# Patient Record
Sex: Female | Born: 1935
Health system: Southern US, Community
[De-identification: ages and names within clinical notes are randomized; demographics above are authoritative.]

## PROBLEM LIST (undated history)

## (undated) DIAGNOSIS — E785 Hyperlipidemia, unspecified: Secondary | ICD-10-CM

## (undated) DIAGNOSIS — I10 Essential (primary) hypertension: Secondary | ICD-10-CM

## (undated) DIAGNOSIS — H409 Unspecified glaucoma: Secondary | ICD-10-CM

## (undated) DIAGNOSIS — M1990 Unspecified osteoarthritis, unspecified site: Secondary | ICD-10-CM

## (undated) DIAGNOSIS — E079 Disorder of thyroid, unspecified: Secondary | ICD-10-CM

## (undated) DIAGNOSIS — M810 Age-related osteoporosis without current pathological fracture: Secondary | ICD-10-CM

## (undated) HISTORY — DX: Essential (primary) hypertension: I10

## (undated) HISTORY — DX: Unspecified glaucoma: H40.9

## (undated) HISTORY — DX: Unspecified osteoarthritis, unspecified site: M19.90

## (undated) HISTORY — DX: Disorder of thyroid, unspecified: E07.9

## (undated) HISTORY — DX: Age-related osteoporosis without current pathological fracture: M81.0

## (undated) HISTORY — DX: Hyperlipidemia, unspecified: E78.5

---

## 1963-03-25 HISTORY — PX: ABDOMINAL HYSTERECTOMY: SHX81

## 1987-03-25 HISTORY — PX: CHOLECYSTECTOMY: SHX55

## 2009-02-13 LAB — HM COLONOSCOPY: HM COLON: NORMAL

## 2009-11-27 LAB — HM MAMMOGRAPHY: HM Mammogram: NORMAL

## 2011-04-02 DIAGNOSIS — I10 Essential (primary) hypertension: Secondary | ICD-10-CM | POA: Diagnosis not present

## 2011-04-02 DIAGNOSIS — Z23 Encounter for immunization: Secondary | ICD-10-CM | POA: Diagnosis not present

## 2011-04-28 DIAGNOSIS — H4011X0 Primary open-angle glaucoma, stage unspecified: Secondary | ICD-10-CM | POA: Diagnosis not present

## 2011-05-05 DIAGNOSIS — H2510 Age-related nuclear cataract, unspecified eye: Secondary | ICD-10-CM | POA: Diagnosis not present

## 2011-05-05 DIAGNOSIS — H4011X0 Primary open-angle glaucoma, stage unspecified: Secondary | ICD-10-CM | POA: Diagnosis not present

## 2011-05-05 DIAGNOSIS — H409 Unspecified glaucoma: Secondary | ICD-10-CM | POA: Diagnosis not present

## 2011-05-05 DIAGNOSIS — H524 Presbyopia: Secondary | ICD-10-CM | POA: Diagnosis not present

## 2011-07-29 DIAGNOSIS — I10 Essential (primary) hypertension: Secondary | ICD-10-CM | POA: Diagnosis not present

## 2011-09-01 DIAGNOSIS — H409 Unspecified glaucoma: Secondary | ICD-10-CM | POA: Diagnosis not present

## 2011-09-01 DIAGNOSIS — H4011X0 Primary open-angle glaucoma, stage unspecified: Secondary | ICD-10-CM | POA: Diagnosis not present

## 2011-10-30 DIAGNOSIS — I739 Peripheral vascular disease, unspecified: Secondary | ICD-10-CM | POA: Diagnosis not present

## 2011-10-30 DIAGNOSIS — I10 Essential (primary) hypertension: Secondary | ICD-10-CM | POA: Diagnosis not present

## 2011-10-30 DIAGNOSIS — R609 Edema, unspecified: Secondary | ICD-10-CM | POA: Diagnosis not present

## 2011-12-12 DIAGNOSIS — I70209 Unspecified atherosclerosis of native arteries of extremities, unspecified extremity: Secondary | ICD-10-CM | POA: Diagnosis not present

## 2011-12-12 DIAGNOSIS — I739 Peripheral vascular disease, unspecified: Secondary | ICD-10-CM | POA: Diagnosis not present

## 2011-12-29 DIAGNOSIS — H409 Unspecified glaucoma: Secondary | ICD-10-CM | POA: Diagnosis not present

## 2011-12-29 DIAGNOSIS — H4011X0 Primary open-angle glaucoma, stage unspecified: Secondary | ICD-10-CM | POA: Diagnosis not present

## 2012-02-11 DIAGNOSIS — M949 Disorder of cartilage, unspecified: Secondary | ICD-10-CM | POA: Diagnosis not present

## 2012-02-11 DIAGNOSIS — R51 Headache: Secondary | ICD-10-CM | POA: Diagnosis not present

## 2012-02-11 DIAGNOSIS — R413 Other amnesia: Secondary | ICD-10-CM | POA: Diagnosis not present

## 2012-02-11 DIAGNOSIS — I10 Essential (primary) hypertension: Secondary | ICD-10-CM | POA: Diagnosis not present

## 2012-02-11 DIAGNOSIS — E785 Hyperlipidemia, unspecified: Secondary | ICD-10-CM | POA: Diagnosis not present

## 2012-02-11 DIAGNOSIS — R42 Dizziness and giddiness: Secondary | ICD-10-CM | POA: Diagnosis not present

## 2012-03-03 DIAGNOSIS — R944 Abnormal results of kidney function studies: Secondary | ICD-10-CM | POA: Diagnosis not present

## 2012-05-24 DIAGNOSIS — H4011X0 Primary open-angle glaucoma, stage unspecified: Secondary | ICD-10-CM | POA: Diagnosis not present

## 2012-05-24 DIAGNOSIS — H2589 Other age-related cataract: Secondary | ICD-10-CM | POA: Diagnosis not present

## 2012-07-21 DIAGNOSIS — I10 Essential (primary) hypertension: Secondary | ICD-10-CM | POA: Diagnosis not present

## 2012-07-21 DIAGNOSIS — H6120 Impacted cerumen, unspecified ear: Secondary | ICD-10-CM | POA: Diagnosis not present

## 2012-07-21 DIAGNOSIS — E785 Hyperlipidemia, unspecified: Secondary | ICD-10-CM | POA: Diagnosis not present

## 2012-07-21 DIAGNOSIS — E782 Mixed hyperlipidemia: Secondary | ICD-10-CM | POA: Diagnosis not present

## 2012-07-30 DIAGNOSIS — R944 Abnormal results of kidney function studies: Secondary | ICD-10-CM | POA: Diagnosis not present

## 2012-09-23 DIAGNOSIS — H4010X0 Unspecified open-angle glaucoma, stage unspecified: Secondary | ICD-10-CM | POA: Diagnosis not present

## 2012-09-23 DIAGNOSIS — H2510 Age-related nuclear cataract, unspecified eye: Secondary | ICD-10-CM | POA: Diagnosis not present

## 2012-09-23 DIAGNOSIS — H04129 Dry eye syndrome of unspecified lacrimal gland: Secondary | ICD-10-CM | POA: Diagnosis not present

## 2012-10-04 DIAGNOSIS — H2589 Other age-related cataract: Secondary | ICD-10-CM | POA: Diagnosis not present

## 2012-11-03 DIAGNOSIS — IMO0002 Reserved for concepts with insufficient information to code with codable children: Secondary | ICD-10-CM | POA: Diagnosis not present

## 2012-11-03 DIAGNOSIS — H2589 Other age-related cataract: Secondary | ICD-10-CM | POA: Diagnosis not present

## 2012-11-03 DIAGNOSIS — H52209 Unspecified astigmatism, unspecified eye: Secondary | ICD-10-CM | POA: Diagnosis not present

## 2012-11-03 DIAGNOSIS — H2510 Age-related nuclear cataract, unspecified eye: Secondary | ICD-10-CM | POA: Diagnosis not present

## 2012-11-03 DIAGNOSIS — H52229 Regular astigmatism, unspecified eye: Secondary | ICD-10-CM | POA: Diagnosis not present

## 2012-12-15 DIAGNOSIS — I10 Essential (primary) hypertension: Secondary | ICD-10-CM | POA: Diagnosis not present

## 2013-02-09 DIAGNOSIS — IMO0002 Reserved for concepts with insufficient information to code with codable children: Secondary | ICD-10-CM | POA: Diagnosis not present

## 2013-02-09 DIAGNOSIS — H2510 Age-related nuclear cataract, unspecified eye: Secondary | ICD-10-CM | POA: Diagnosis not present

## 2013-02-09 DIAGNOSIS — H52209 Unspecified astigmatism, unspecified eye: Secondary | ICD-10-CM | POA: Diagnosis not present

## 2013-02-09 DIAGNOSIS — H52229 Regular astigmatism, unspecified eye: Secondary | ICD-10-CM | POA: Diagnosis not present

## 2013-03-15 DIAGNOSIS — E782 Mixed hyperlipidemia: Secondary | ICD-10-CM | POA: Diagnosis not present

## 2013-03-15 DIAGNOSIS — I10 Essential (primary) hypertension: Secondary | ICD-10-CM | POA: Diagnosis not present

## 2013-03-15 DIAGNOSIS — I739 Peripheral vascular disease, unspecified: Secondary | ICD-10-CM | POA: Diagnosis not present

## 2013-03-15 DIAGNOSIS — Z13820 Encounter for screening for osteoporosis: Secondary | ICD-10-CM | POA: Diagnosis not present

## 2013-03-15 DIAGNOSIS — I6529 Occlusion and stenosis of unspecified carotid artery: Secondary | ICD-10-CM | POA: Diagnosis not present

## 2013-03-15 DIAGNOSIS — I70219 Atherosclerosis of native arteries of extremities with intermittent claudication, unspecified extremity: Secondary | ICD-10-CM | POA: Diagnosis not present

## 2013-03-15 DIAGNOSIS — M899 Disorder of bone, unspecified: Secondary | ICD-10-CM | POA: Diagnosis not present

## 2013-03-15 DIAGNOSIS — E785 Hyperlipidemia, unspecified: Secondary | ICD-10-CM | POA: Diagnosis not present

## 2013-03-15 LAB — HM DEXA SCAN

## 2013-03-21 DIAGNOSIS — D72829 Elevated white blood cell count, unspecified: Secondary | ICD-10-CM | POA: Diagnosis not present

## 2013-03-21 DIAGNOSIS — D72828 Other elevated white blood cell count: Secondary | ICD-10-CM | POA: Diagnosis not present

## 2013-04-20 DIAGNOSIS — D72829 Elevated white blood cell count, unspecified: Secondary | ICD-10-CM | POA: Diagnosis not present

## 2013-04-20 DIAGNOSIS — I10 Essential (primary) hypertension: Secondary | ICD-10-CM | POA: Diagnosis not present

## 2013-04-20 DIAGNOSIS — M2550 Pain in unspecified joint: Secondary | ICD-10-CM | POA: Diagnosis not present

## 2013-04-20 DIAGNOSIS — Z0189 Encounter for other specified special examinations: Secondary | ICD-10-CM | POA: Diagnosis not present

## 2013-05-18 DIAGNOSIS — I10 Essential (primary) hypertension: Secondary | ICD-10-CM | POA: Diagnosis not present

## 2013-05-18 DIAGNOSIS — D72829 Elevated white blood cell count, unspecified: Secondary | ICD-10-CM | POA: Diagnosis not present

## 2013-05-18 DIAGNOSIS — C9110 Chronic lymphocytic leukemia of B-cell type not having achieved remission: Secondary | ICD-10-CM | POA: Diagnosis not present

## 2013-06-01 DIAGNOSIS — H4011X0 Primary open-angle glaucoma, stage unspecified: Secondary | ICD-10-CM | POA: Diagnosis not present

## 2013-08-04 DIAGNOSIS — C9110 Chronic lymphocytic leukemia of B-cell type not having achieved remission: Secondary | ICD-10-CM | POA: Diagnosis not present

## 2013-08-04 DIAGNOSIS — E039 Hypothyroidism, unspecified: Secondary | ICD-10-CM | POA: Diagnosis not present

## 2013-08-04 DIAGNOSIS — D72829 Elevated white blood cell count, unspecified: Secondary | ICD-10-CM | POA: Diagnosis not present

## 2013-08-04 DIAGNOSIS — E782 Mixed hyperlipidemia: Secondary | ICD-10-CM | POA: Diagnosis not present

## 2013-08-04 DIAGNOSIS — I70219 Atherosclerosis of native arteries of extremities with intermittent claudication, unspecified extremity: Secondary | ICD-10-CM | POA: Diagnosis not present

## 2013-11-16 ENCOUNTER — Encounter: Payer: Self-pay | Admitting: Hematology & Oncology

## 2013-11-23 ENCOUNTER — Telehealth: Payer: Self-pay | Admitting: Hematology & Oncology

## 2013-11-23 ENCOUNTER — Ambulatory Visit (HOSPITAL_BASED_OUTPATIENT_CLINIC_OR_DEPARTMENT_OTHER): Payer: Medicare Other | Admitting: Hematology & Oncology

## 2013-11-23 ENCOUNTER — Encounter: Payer: Self-pay | Admitting: Hematology & Oncology

## 2013-11-23 ENCOUNTER — Other Ambulatory Visit (HOSPITAL_BASED_OUTPATIENT_CLINIC_OR_DEPARTMENT_OTHER): Payer: Medicare Other | Admitting: Lab

## 2013-11-23 ENCOUNTER — Ambulatory Visit: Payer: Medicare Other

## 2013-11-23 VITALS — BP 155/77 | HR 70 | Temp 98.2°F | Resp 14 | Ht 60.0 in | Wt 162.0 lb

## 2013-11-23 DIAGNOSIS — C9110 Chronic lymphocytic leukemia of B-cell type not having achieved remission: Secondary | ICD-10-CM

## 2013-11-23 LAB — CBC WITH DIFFERENTIAL (CANCER CENTER ONLY)
BASO#: 0 10*3/uL (ref 0.0–0.2)
BASO%: 0.2 % (ref 0.0–2.0)
EOS%: 2.2 % (ref 0.0–7.0)
Eosinophils Absolute: 0.2 10*3/uL (ref 0.0–0.5)
HEMATOCRIT: 41.4 % (ref 34.8–46.6)
HGB: 14 g/dL (ref 11.6–15.9)
LYMPH#: 6.7 10*3/uL — ABNORMAL HIGH (ref 0.9–3.3)
LYMPH%: 60.9 % — ABNORMAL HIGH (ref 14.0–48.0)
MCH: 32 pg (ref 26.0–34.0)
MCHC: 33.8 g/dL (ref 32.0–36.0)
MCV: 95 fL (ref 81–101)
MONO#: 0.9 10*3/uL (ref 0.1–0.9)
MONO%: 8.1 % (ref 0.0–13.0)
NEUT%: 28.6 % — AB (ref 39.6–80.0)
NEUTROS ABS: 3.1 10*3/uL (ref 1.5–6.5)
Platelets: 193 10*3/uL (ref 145–400)
RBC: 4.38 10*6/uL (ref 3.70–5.32)
RDW: 13.4 % (ref 11.1–15.7)
WBC: 10.9 10*3/uL — ABNORMAL HIGH (ref 3.9–10.0)

## 2013-11-23 LAB — CHCC SATELLITE - SMEAR

## 2013-11-23 NOTE — Telephone Encounter (Signed)
I spoke w NEW PATIENT today to remind them of their appointment with Dr. Ennever. Also, advised them to bring all medication bottles and insurance card information. ° °

## 2013-11-23 NOTE — Progress Notes (Signed)
Referral MD  Reason for Referral: Stage A chronic lymphocytic leukemia   Chief Complaint  Patient presents with  . NEW PATIENT  : I just moved here from [place].  HPI: Ms.Cogdell is a very charming 80 year old white female. She certainly looks younger. She actually is the grandmother of our pharmacist, been, downstairs.  She's been in [place] for 62 years. Her husband passed away. She's been by herself. Her daughter lives in [place]. As such, she now has moved back to the [place] to be with her family.  She was diagnosed with CLL. She was found to have an elevated white cell count over the past few years. She ultimately underwent flow cytometry which showed that she had CLL. She had this done back in February of this year. Parishes been totally asymptomatic. As such, it was felt that she could just be followed. It was not felt that she needed any type of intervention.  She's had a problem with infections. She's had a problem with rashes. There's been no swollen glands. She's had no abdominal pain. She's had no change in bowel or bladder habits. She has had no weight loss or weight gain.  Overall, her performance status is ECOG 1  She is currently referred to the [facility] so that we could evaluate her for the CLL.   No past medical history on file.:  No past surgical history on file.:  Current outpatient prescriptions:amLODipine (NORVASC) 5 MG tablet, Take 5 mg by mouth daily., Disp: , Rfl: ;  latanoprost (XALATAN) 0.005 % ophthalmic solution, Place 1 drop into both eyes at bedtime., Disp: , Rfl: ;  Levothyroxine Sodium 25 MCG CAPS, Take by mouth daily before breakfast., Disp: , Rfl: ;  Multiple Vitamins-Minerals (SENIOR MULTIVITAMIN PLUS PO), Take by mouth every morning., Disp: , Rfl:  Naproxen Sodium (ALEVE PO), Take by mouth as needed., Disp: , Rfl: ;  olmesartan (BENICAR) 20 MG tablet, Take 20 mg by mouth daily., Disp: , Rfl: ;  simvastatin (ZOCOR) 20 MG tablet, Take 20 mg  by mouth daily., Disp: , Rfl: :  :  No Known Allergies:  No family history on file.:  History   Social History  . Marital Status: Widowed    Spouse Name: N/A    Number of Children: N/A  . Years of Education: N/A   Occupational History  . Not on file.   Social History Main Topics  . Smoking status: Never Smoker   . Smokeless tobacco: Never Used     Comment: never used tobacco  . Alcohol Use: Not on file  . Drug Use: Not on file  . Sexual Activity: Not on file   Other Topics Concern  . Not on file   Social History Narrative  . No narrative on file  :  Pertinent items are noted in HPI.  Exam: @IPVITALS @  well-developed and well-nourished elderly female in no obvious distress. Her vital signs show temperature of 98.2. Pulse 70. Blood pressure 155/77. Weight is 162 pounds. Head and neck exam shows no ocular or oral lesions. There are no palpable cervical or supraclavicular lymph nodes. Lungs are clear bilaterally. Cardiac exam regular in rhythm with no murmurs rubs or bruits. Abdomen is soft. She has good bowel sounds. There is no fluid wave. There is no palpable liver or spleen tip. Back exam shows some slight kyphosis. No tenderness is noted over the spine ribs or hips. Extremities shows no clubbing, cyanosis or edema. Neurological exam shows no focal neurological deficits.  Recent Labs  11/23/13 1312  WBC 10.9*  HGB 14.0  HCT 41.4  PLT 193   No results found for this basename: NA, K, CL, CO2, GLUCOSE, BUN, CREATININE, CALCIUM,  in the last 72 hours  Blood smear review: Normochromic and normocytic probably separate blood cells. She has no rouleau formation. There is no schistocytes or spherocytes. She has no nucleated red blood cells. White cells are minimally increased in number. She is majority of a mature lymphocytes. There is no immature myeloid cells. I see no atypical lymphocytes. Platelet are adequate in number and size.  Pathology: Flow cytometry done back  in January in [place] showed her white blood cells to be CD5 positive.     Assessment and Plan:   Ms. Jarquin is a very charming 80 year old white female with CLL. She has a stage A disease.  Is absolutely no indication that we have to treat her. I suspect that she never will have to be treated given her age. I would think that she will likely succumb to some other disease long before the CLL becomes a problem.  She and her daughter certainly knew what was going on. They had been well read up on CLL.  I that we can probably get her back to see [place] in about 6 months. I don't see that we have to do any CT scans. I do not see a need for a bone marrow test. I think we just leave her alone and let her enjoy her time on the [facility].  I spent about 45 minutes with she and her daughter. I answered their questions.

## 2013-11-24 DIAGNOSIS — H40019 Open angle with borderline findings, low risk, unspecified eye: Secondary | ICD-10-CM | POA: Diagnosis not present

## 2013-11-24 DIAGNOSIS — H2640 Unspecified secondary cataract: Secondary | ICD-10-CM | POA: Diagnosis not present

## 2013-11-24 DIAGNOSIS — H04129 Dry eye syndrome of unspecified lacrimal gland: Secondary | ICD-10-CM | POA: Diagnosis not present

## 2013-11-24 DIAGNOSIS — H43819 Vitreous degeneration, unspecified eye: Secondary | ICD-10-CM | POA: Diagnosis not present

## 2014-01-02 DIAGNOSIS — Z23 Encounter for immunization: Secondary | ICD-10-CM | POA: Diagnosis not present

## 2014-01-04 DIAGNOSIS — C44310 Basal cell carcinoma of skin of unspecified parts of face: Secondary | ICD-10-CM | POA: Diagnosis not present

## 2014-01-30 ENCOUNTER — Telehealth: Payer: Self-pay

## 2014-01-30 DIAGNOSIS — C9110 Chronic lymphocytic leukemia of B-cell type not having achieved remission: Secondary | ICD-10-CM

## 2014-01-30 MED ORDER — SIMVASTATIN 20 MG PO TABS: 20.0000 mg | ORAL_TABLET | Freq: Every day | ORAL | Status: DC

## 2014-01-30 NOTE — Telephone Encounter (Signed)
Patient was scheduled to establish care on Monday 02/06/14, appointment was canceled per provider and rescheduled to 04/24/14. Patient will run out of medication (Simvastatin) prior to February appointment. The patient's daughter "[name]" would like to know if Dr.Reed will be willing to refill medication although she has never seen the patient.  [place] @ [facility]  If not, please advise on what patient should do, previous doctor is in [place].

## 2014-01-30 NOTE — Telephone Encounter (Signed)
Ok to fill simvastatin and any other non-controlled meds they need until she is seen.

## 2014-01-30 NOTE — Telephone Encounter (Signed)
Rx sent electronically, patients daughter informed.

## 2014-02-06 ENCOUNTER — Ambulatory Visit: Payer: Self-pay | Admitting: Internal Medicine

## 2014-02-07 ENCOUNTER — Encounter: Payer: Self-pay | Admitting: *Deleted

## 2014-02-21 HISTORY — PX: SKIN SURGERY: SHX2413

## 2014-03-07 DIAGNOSIS — C44112 Basal cell carcinoma of skin of right eyelid, including canthus: Secondary | ICD-10-CM | POA: Diagnosis not present

## 2014-03-28 DIAGNOSIS — H4011X3 Primary open-angle glaucoma, severe stage: Secondary | ICD-10-CM | POA: Diagnosis not present

## 2014-04-24 ENCOUNTER — Encounter: Payer: Self-pay | Admitting: Internal Medicine

## 2014-04-24 ENCOUNTER — Ambulatory Visit (INDEPENDENT_AMBULATORY_CARE_PROVIDER_SITE_OTHER): Payer: Medicare Other | Admitting: Internal Medicine

## 2014-04-24 VITALS — BP 126/78 | HR 74 | Temp 97.9°F | Ht 60.0 in | Wt 164.0 lb

## 2014-04-24 DIAGNOSIS — Z23 Encounter for immunization: Secondary | ICD-10-CM | POA: Diagnosis not present

## 2014-04-24 DIAGNOSIS — H409 Unspecified glaucoma: Secondary | ICD-10-CM

## 2014-04-24 DIAGNOSIS — M79609 Pain in unspecified limb: Secondary | ICD-10-CM | POA: Diagnosis not present

## 2014-04-24 DIAGNOSIS — M79651 Pain in right thigh: Secondary | ICD-10-CM

## 2014-04-24 DIAGNOSIS — E039 Hypothyroidism, unspecified: Secondary | ICD-10-CM

## 2014-04-24 DIAGNOSIS — G5690 Unspecified mononeuropathy of unspecified upper limb: Secondary | ICD-10-CM | POA: Diagnosis not present

## 2014-04-24 DIAGNOSIS — E785 Hyperlipidemia, unspecified: Secondary | ICD-10-CM | POA: Diagnosis not present

## 2014-04-24 DIAGNOSIS — C9110 Chronic lymphocytic leukemia of B-cell type not having achieved remission: Secondary | ICD-10-CM | POA: Insufficient documentation

## 2014-04-24 DIAGNOSIS — M810 Age-related osteoporosis without current pathological fracture: Secondary | ICD-10-CM

## 2014-04-24 DIAGNOSIS — I739 Peripheral vascular disease, unspecified: Secondary | ICD-10-CM

## 2014-04-24 DIAGNOSIS — I10 Essential (primary) hypertension: Secondary | ICD-10-CM | POA: Diagnosis not present

## 2014-04-24 DIAGNOSIS — R739 Hyperglycemia, unspecified: Secondary | ICD-10-CM | POA: Diagnosis not present

## 2014-04-24 DIAGNOSIS — M159 Polyosteoarthritis, unspecified: Secondary | ICD-10-CM

## 2014-04-24 MED ORDER — LEVOTHYROXINE SODIUM 25 MCG PO CAPS: 1.0000 | ORAL_CAPSULE | Freq: Every day | ORAL | Status: DC

## 2014-04-24 MED ORDER — SIMVASTATIN 20 MG PO TABS: 20.0000 mg | ORAL_TABLET | Freq: Every day | ORAL | Status: DC

## 2014-04-24 MED ORDER — ASPIRIN EC 81 MG PO TBEC: 81.0000 mg | DELAYED_RELEASE_TABLET | Freq: Every day | ORAL | Status: DC

## 2014-04-24 MED ORDER — VITAMIN D 50 MCG (2000 UT) PO CAPS: 2000.0000 [IU] | ORAL_CAPSULE | Freq: Every day | ORAL | Status: AC

## 2014-04-24 MED ORDER — AMLODIPINE BESYLATE 5 MG PO TABS: 5.0000 mg | ORAL_TABLET | Freq: Every day | ORAL | Status: DC

## 2014-04-24 MED ORDER — OLMESARTAN MEDOXOMIL 20 MG PO TABS: 20.0000 mg | ORAL_TABLET | Freq: Every day | ORAL | Status: DC

## 2014-04-24 NOTE — Progress Notes (Signed)
Patient ID: Krystal [name], female   DOB: December 03, 1925, 81 y.o.   MRN: [id number]   Location:  [facility]  Code Status: full code, does have living will  No Known Allergies                                                                                                                             Chief Complaint  Patient presents with  . Establish Care    New patient establish care (long term swelling of feet- never taken fluid pill )    HPI: Patient is a 81 y.o. female seen in the office today to establish with the practice.  Her daughter, Krystal [name], goes to [facility] next door.  She has had long term edema of her feet, but has never taken a diuretic.  Thinks it's been going on about a year.  They will go down overnight and come back up when she walks and is up and about.  No DVTs in the past.  No chest pains or shortness of breath.  Does have some arthritis in her back and bilateral hips.  If she knows she'll be very active walking, she takes the ibuprofen 2 pills.  Does not take daily.  Has a pain in her right upper leg that she thinks is a muscle that comes and goes.  Happens if gets up from being down on knees and gets back up.  It's inconsistent.  Relieved when gets up and walks.  Pain is sharp.  Goes away in 5-10 mins.  No numbness or tingling in her feet or legs.    Does sometimes have tingling in her hands at night.    BP stable with benicar and norvasc.  Has high cholesterol which has been controlled with simvastatin therapy.  Also takes fish oil.    Does not exercise.  Has never been one to do that even when young.    Has senile osteoporosis.  Takes the mvi, but no vitamin D or calcium supplement.  Her daughter thinks she did take some fosamax or boniva in the past, but pt does not recall this.    She has stage A CLL but does not warrant treatment.  She follows up with Dr. Marin Olp in 6 mos.  He does not feel she needs to have a  bone marrow biopsy.    Had been having pain through her lower abdomen.  [place] was revealing that she might have a vein blockage.  Specialist said that is was slight and she should f/u on it.  She has not followed up on this.    Synthroid is less than a year old.  Is taking it properly.  Glaucoma:  Says eyes are doing ok.  She's had cataract surgery.  Uses her drops before bed.  Cscope in 2010 was benign and does not need anymore. Had partial hysterectomy.   Bone density 2010 and 2014.  Has osteoporosis and her daughter thinks she did take a bisphosphonate in the past. Mammogram 2010.  All have been normal.   Had pneumovax in 2014 Had flu shot in October 2015. Had tdap 2014 Has living will but did not bring copy.  Last visit with her PCP in [place] was 5/15.    Review of Systems:  Review of Systems  Constitutional: Negative for weight loss and malaise/fatigue.  HENT: Positive for tinnitus. Negative for hearing loss.   Eyes:       Dry eyes, glaucoma, wears glasses  Respiratory: Negative for cough and shortness of breath.   Cardiovascular: Positive for leg swelling. Negative for chest pain, palpitations, orthopnea, claudication and PND.       High blood pressure  Gastrointestinal: Positive for heartburn and abdominal pain.       Hiatal hernia  Genitourinary: Negative for dysuria.  Musculoskeletal: Positive for myalgias, back pain and joint pain.       And swelling/stiffness; right thigh pain; osteoporosis  Skin:       Dry skin  Neurological: Negative for dizziness and weakness.  Psychiatric/Behavioral: Negative for depression and memory loss.       Says she has some increased difficulty with her memory (I was concerned as she tried to provide history today), confusion     Past Medical History  Diagnosis Date  . Hypertension   . Hyperlipidemia   . Thyroid disease   . Arthritis   . Osteoporosis   . Glaucoma     Past Surgical History  Procedure Laterality Date  . Abdominal  hysterectomy  1965    partial  . Cholecystectomy  1989    Social History:   reports that she has never smoked. She has never used smokeless tobacco. She reports that she does not drink alcohol or use illicit drugs.  Family History  Problem Relation Age of Onset  . Cancer Father     lung  . Heart disease Father   . Cancer Mother     Medications: Patient's Medications  New Prescriptions   No medications on file  Previous Medications   AMLODIPINE (NORVASC) 5 MG TABLET    Take 5 mg by mouth daily.   IBUPROFEN (ADVIL,MOTRIN) 200 MG TABLET    Take 200 mg by mouth as needed.   LATANOPROST (XALATAN) 0.005 % OPHTHALMIC SOLUTION    Place 1 drop into both eyes at bedtime.   LEVOTHYROXINE SODIUM 25 MCG CAPS    Take by mouth daily before breakfast.   MULTIPLE VITAMINS-MINERALS (SENIOR MULTIVITAMIN PLUS PO)    Take by mouth every morning.   OLMESARTAN (BENICAR) 20 MG TABLET    Take 20 mg by mouth daily.   OMEGA-3 FATTY ACIDS (FISH OIL) 1000 MG CAPS    Take 1,000 capsules by mouth 2 (two) times daily.   POLYETHYL GLYCOL-PROPYL GLYCOL (SYSTANE OP)    Apply to eye.   SIMVASTATIN (ZOCOR) 20 MG TABLET    Take 1 tablet (20 mg total) by mouth daily.  Modified Medications   No medications on file  Discontinued Medications   No medications on file     Physical Exam: Filed Vitals:   04/24/14 0936  BP: 126/78  Pulse: 74  Temp: 97.9 F (36.6 C)  TempSrc: Oral  Height: 5' (1.524 m)  Weight: 164 lb (74.39 kg)  SpO2: 95%  Physical Exam  Constitutional: She is oriented to person, place, and time. She appears well-developed and well-nourished. No distress.  Cardiovascular: Normal rate, regular  rhythm, normal heart sounds and intact distal pulses.   Pulmonary/Chest: Effort normal and breath sounds normal. No respiratory distress.  Abdominal: Soft. Bowel sounds are normal. She exhibits no distension and no mass. There is no tenderness.  Musculoskeletal: Normal range of motion. She exhibits no  tenderness.  Neurological: She is alert and oriented to person, place, and time. No cranial nerve deficit.  Confused some when providing history about her conditions and had timeframes incorrect  Skin: Skin is warm and dry.  Varicose veins in bilateral ankles and feet, DPs and PT pulses intact  Psychiatric: She has a normal mood and affect.    Labs reviewed: CBC:  Recent Labs  11/23/13 1312  WBC 10.9*  NEUTROABS 3.1  HGB 14.0  HCT 41.4  MCV 95  PLT 193   Assessment/Plan 1. CLL (chronic lymphocytic leukemia) -stable, follows with Dr. Venetia Constable a candidate for treatment or bone marrow biopsy, cont to monitor - CBC with Differential/Platelet  2. Essential hypertension, benign -bp is at goal today -did discuss that her norvasc can contribute to the edema in her ankles as well as her varicosities/chronic venous insuffiency - amLODipine (NORVASC) 5 MG tablet; Take 1 tablet (5 mg total) by mouth daily.  Dispense: 90 tablet; Refill: 1 - olmesartan (BENICAR) 20 MG tablet; Take 1 tablet (20 mg total) by mouth daily.  Dispense: 90 tablet; Refill: 1 - Comprehensive metabolic panel  3. Hyperlipidemia -cont zocor, discussed proper diet and encouraged more walking for exercise, f/u labs  - simvastatin (ZOCOR) 20 MG tablet; Take 1 tablet (20 mg total) by mouth daily.  Dispense: 90 tablet; Refill: 1 - Lipid panel - Hemoglobin A1c  4. Glaucoma -cont xalatan and f/u with Dr. Satira Sark as planned  5. Hypothyroidism, unspecified hypothyroidism type -is clinically euthyroid, cont current dose and check tsh - Levothyroxine Sodium 25 MCG CAPS; Take 1 capsule (25 mcg total) by mouth daily before breakfast.  Dispense: 90 capsule; Refill: 1 - TSH  6. Senile osteoporosis - last treatment was with Florestine Avers it appears in 2011 based on her records from her prior PCP, but she is not on anything at this time--advised to begin otc vitamin D 2000 units weekly and encouraged weightbearing exercise--will  discuss prolia or zoledronic acid at next visit - Cholecalciferol (VITAMIN D) 2000 UNITS CAPS; Take 1 capsule (2,000 Units total) by mouth daily.  Dispense: 30 capsule; Refill: 3  7. Generalized osteoarthritis of multiple sites -has been using prn ibuprofen for pain--does not use that much, but need to counsel next visit about tylenol instead to avoid renal and GI damage  8. Right thigh pain - sounds in some ways like claudication, others like a simple musculoskeletal pain;  Had prior abnormality on arterial doppler in [place] several years ago and copy was [facility]ed [facility]--she had not followed up on this based on the records and now has this pain and the lower abdominal pain (sounds like there had been a blockage in her left rather than right iliac) - B12 and Folate Panel - [place] ABI W/WO TBI; Future  9. PAD (peripheral artery disease) -see #8 - aspirin EC 81 MG tablet; Take 1 tablet (81 mg total) by mouth daily.  Dispense: 30 tablet; Refill: 3 - Comprehensive metabolic panel - Hemoglobin A1c  10. Need for vaccination with 13-polyvalent pneumococcal conjugate vaccine - Pneumococcal conjugate vaccine 13-valent (prevnar) given  Labs/tests ordered: Orders Placed This Encounter  Procedures  . [place] ABI W/WO TBI    Standing Status:  Future     Number of Occurrences:      Standing Expiration Date: 06/26/2015    Order Specific Question:  Reason for Exam (SYMPTOM  OR DIAGNOSIS REQUIRED)    Answer:  pvd    Order Specific Question:  Preferred imaging location?    Answer:  Internal  . Pneumococcal conjugate vaccine 13-valent  . Comprehensive metabolic panel    Order Specific Question:  Has the patient fasted?    Answer:  Yes  . Lipid panel    Order Specific Question:  Has the patient fasted?    Answer:  Yes  . Hemoglobin A1c  . TSH  . B12 and Folate Panel  . CBC with Differential/Platelet  . HM COLONOSCOPY    This external order was created through the Results Console.    Next  appt:  3 mos for annual exam with mmse due to confusion noted (her daughter had to fill in a lot of info)  Henley Blyth L. Mcclellan Demarais, D.O. [facility] [address], [place] [address] Cell Phone (Mon-Fri 8am-5pm):  [phone] On Call:  [phone] & follow prompts after 5pm & weekends Office Phone:  [phone] Office Fax:  [phone]

## 2014-04-25 ENCOUNTER — Other Ambulatory Visit: Payer: Self-pay | Admitting: Internal Medicine

## 2014-04-25 DIAGNOSIS — M79651 Pain in right thigh: Secondary | ICD-10-CM

## 2014-04-25 LAB — CBC WITH DIFFERENTIAL/PLATELET
Basophils Absolute: 0 10*3/uL (ref 0.0–0.2)
Basos: 0 %
Eos: 2 %
Eosinophils Absolute: 0.2 10*3/uL (ref 0.0–0.4)
HCT: 39.2 % (ref 34.0–46.6)
Hemoglobin: 13.3 g/dL (ref 11.1–15.9)
Immature Grans (Abs): 0 10*3/uL (ref 0.0–0.1)
Immature Granulocytes: 0 %
Lymphocytes Absolute: 6 10*3/uL — ABNORMAL HIGH (ref 0.7–3.1)
Lymphs: 61 %
MCH: 30.9 pg (ref 26.6–33.0)
MCHC: 33.9 g/dL (ref 31.5–35.7)
MCV: 91 fL (ref 79–97)
Monocytes Absolute: 0.7 10*3/uL (ref 0.1–0.9)
Monocytes: 7 %
Neutrophils Absolute: 2.9 10*3/uL (ref 1.4–7.0)
Neutrophils Relative %: 30 %
Platelets: 208 10*3/uL (ref 150–379)
RBC: 4.31 x10E6/uL (ref 3.77–5.28)
RDW: 13.2 % (ref 12.3–15.4)
WBC: 9.8 10*3/uL (ref 3.4–10.8)

## 2014-04-25 LAB — TSH: TSH: 3.36 u[IU]/mL (ref 0.450–4.500)

## 2014-04-25 LAB — LIPID PANEL
Chol/HDL Ratio: 2 ratio units (ref 0.0–4.4)
Cholesterol, Total: 141 mg/dL (ref 100–199)
HDL: 69 mg/dL (ref >39)
LDL Calculated: 62 mg/dL (ref 0–99)
Triglycerides: 51 mg/dL (ref 0–149)
VLDL Cholesterol Cal: 10 mg/dL (ref 5–40)

## 2014-04-25 LAB — HEMOGLOBIN A1C
Est. average glucose Bld gHb Est-mCnc: 123 mg/dL
Hgb A1c MFr Bld: 5.9 % — ABNORMAL HIGH (ref 4.8–5.6)

## 2014-04-25 LAB — COMPREHENSIVE METABOLIC PANEL
ALT: 10 IU/L (ref 0–32)
AST: 23 IU/L (ref 0–40)
Albumin/Globulin Ratio: 2.2 (ref 1.1–2.5)
Albumin: 4.1 g/dL (ref 3.5–4.7)
Alkaline Phosphatase: 77 IU/L (ref 39–117)
BUN/Creatinine Ratio: 19 (ref 11–26)
BUN: 17 mg/dL (ref 8–27)
CO2: 21 mmol/L (ref 18–29)
Calcium: 9.3 mg/dL (ref 8.7–10.3)
Chloride: 102 mmol/L (ref 97–108)
Creatinine, Ser: 0.91 mg/dL (ref 0.57–1.00)
GFR calc Af Amer: 66 mL/min/{1.73_m2} (ref >59)
GFR calc non Af Amer: 57 mL/min/{1.73_m2} — ABNORMAL LOW (ref >59)
Globulin, Total: 1.9 g/dL (ref 1.5–4.5)
Glucose: 92 mg/dL (ref 65–99)
Potassium: 4.4 mmol/L (ref 3.5–5.2)
Sodium: 140 mmol/L (ref 134–144)
Total Bilirubin: 0.8 mg/dL (ref 0.0–1.2)
Total Protein: 6 g/dL (ref 6.0–8.5)

## 2014-04-25 LAB — B12 AND FOLATE PANEL
Folate: >20 ng/mL (ref >3.0)
Vitamin B-12: 656 pg/mL (ref 211–946)

## 2014-04-27 ENCOUNTER — Other Ambulatory Visit: Payer: Self-pay | Admitting: Internal Medicine

## 2014-04-27 DIAGNOSIS — M79651 Pain in right thigh: Secondary | ICD-10-CM

## 2014-04-27 DIAGNOSIS — I739 Peripheral vascular disease, unspecified: Secondary | ICD-10-CM

## 2014-05-01 ENCOUNTER — Ambulatory Visit (HOSPITAL_COMMUNITY)
Admission: RE | Admit: 2014-05-01 | Discharge: 2014-05-01 | Disposition: A | Discharge status: Home / Self Care | Payer: Medicare Other | Source: Ambulatory Visit | Attending: Internal Medicine | Admitting: Internal Medicine

## 2014-05-01 ENCOUNTER — Ambulatory Visit (HOSPITAL_COMMUNITY)
Admission: RE | Admit: 2014-05-01 | Discharge: 2014-05-01 | Disposition: A | Discharge status: Home / Self Care | Payer: Medicare Other | Source: Ambulatory Visit | Attending: Surgery | Admitting: Surgery

## 2014-05-01 DIAGNOSIS — E785 Hyperlipidemia, unspecified: Secondary | ICD-10-CM | POA: Insufficient documentation

## 2014-05-01 DIAGNOSIS — I739 Peripheral vascular disease, unspecified: Secondary | ICD-10-CM

## 2014-05-01 DIAGNOSIS — M79651 Pain in right thigh: Secondary | ICD-10-CM

## 2014-05-01 DIAGNOSIS — I10 Essential (primary) hypertension: Secondary | ICD-10-CM | POA: Diagnosis not present

## 2014-05-09 ENCOUNTER — Telehealth: Payer: Self-pay | Admitting: *Deleted

## 2014-05-09 NOTE — Telephone Encounter (Signed)
Patient was called and informed of her results from her arterial study. She understood that the study came back normal, and was happy with the results. Her daughter was also called with the report as well.

## 2014-05-24 ENCOUNTER — Other Ambulatory Visit (HOSPITAL_BASED_OUTPATIENT_CLINIC_OR_DEPARTMENT_OTHER): Payer: Medicare Other | Admitting: Lab

## 2014-05-24 ENCOUNTER — Ambulatory Visit (HOSPITAL_BASED_OUTPATIENT_CLINIC_OR_DEPARTMENT_OTHER): Payer: Medicare Other | Admitting: Hematology & Oncology

## 2014-05-24 ENCOUNTER — Encounter: Payer: Self-pay | Admitting: Hematology & Oncology

## 2014-05-24 VITALS — BP 149/62 | HR 75 | Temp 97.9°F | Resp 14 | Ht 60.0 in | Wt 165.0 lb

## 2014-05-24 DIAGNOSIS — C9110 Chronic lymphocytic leukemia of B-cell type not having achieved remission: Secondary | ICD-10-CM | POA: Diagnosis not present

## 2014-05-24 LAB — CMP (CANCER CENTER ONLY)
ALBUMIN: 3.9 g/dL (ref 3.3–5.5)
ALT: 9 U/L — AB (ref 10–47)
AST: 19 U/L (ref 11–38)
Alkaline Phosphatase: 84 U/L (ref 26–84)
BUN, Bld: 19 mg/dL (ref 7–22)
CALCIUM: 9.4 mg/dL (ref 8.0–10.3)
CO2: 26 meq/L (ref 18–33)
Chloride: 102 mEq/L (ref 98–108)
Creat: 0.9 mg/dl (ref 0.6–1.2)
Glucose, Bld: 97 mg/dL (ref 73–118)
POTASSIUM: 4.2 meq/L (ref 3.3–4.7)
SODIUM: 142 meq/L (ref 128–145)
TOTAL PROTEIN: 6.5 g/dL (ref 6.4–8.1)
Total Bilirubin: 0.8 mg/dl (ref 0.20–1.60)

## 2014-05-24 LAB — CBC WITH DIFFERENTIAL (CANCER CENTER ONLY)
BASO#: 0 10*3/uL (ref 0.0–0.2)
BASO%: 0.3 % (ref 0.0–2.0)
EOS ABS: 0.3 10*3/uL (ref 0.0–0.5)
EOS%: 2.2 % (ref 0.0–7.0)
HCT: 40.4 % (ref 34.8–46.6)
HEMOGLOBIN: 13.5 g/dL (ref 11.6–15.9)
LYMPH#: 7.2 10*3/uL — AB (ref 0.9–3.3)
LYMPH%: 61.7 % — ABNORMAL HIGH (ref 14.0–48.0)
MCH: 31.6 pg (ref 26.0–34.0)
MCHC: 33.4 g/dL (ref 32.0–36.0)
MCV: 95 fL (ref 81–101)
MONO#: 1.1 10*3/uL — AB (ref 0.1–0.9)
MONO%: 8.9 % (ref 0.0–13.0)
NEUT#: 3.2 10*3/uL (ref 1.5–6.5)
NEUT%: 26.9 % — ABNORMAL LOW (ref 39.6–80.0)
PLATELETS: 201 10*3/uL (ref 145–400)
RBC: 4.27 10*6/uL (ref 3.70–5.32)
RDW: 13.3 % (ref 11.1–15.7)
WBC: 11.7 10*3/uL — ABNORMAL HIGH (ref 3.9–10.0)

## 2014-05-24 LAB — TECHNOLOGIST REVIEW CHCC SATELLITE

## 2014-05-24 LAB — CHCC SATELLITE - SMEAR

## 2014-05-24 NOTE — Progress Notes (Signed)
Hematology and Oncology Follow Up Visit  Krystal [name] [id number] 21-Jan-1926 81 y.o. 05/24/2014   Principle Diagnosis:   Stage A CLL  Current Therapy:    Observation     Interim History:  Ms.  [name] is comes in for second office visit. She's doing quite well. She's had no problems since we last saw her. She does see a family doctor now.  She's had no problem with infections. She's had no issues with nausea or vomiting. There's been no change in bowel or bladder habits. She's had no rashes. She's had no palpable lymph glands.  Her appetite has been okay.  She's had no cough.  Overall, her performance status is ECOG 2.  Medications:  Current outpatient prescriptions:  .  amLODipine (NORVASC) 5 MG tablet, Take 1 tablet (5 mg total) by mouth daily., Disp: 90 tablet, Rfl: 1 .  aspirin EC 81 MG tablet, Take 1 tablet (81 mg total) by mouth daily., Disp: 30 tablet, Rfl: 3 .  Cholecalciferol (VITAMIN D) 2000 UNITS CAPS, Take 1 capsule (2,000 Units total) by mouth daily., Disp: 30 capsule, Rfl: 3 .  ibuprofen (ADVIL,MOTRIN) 200 MG tablet, Take 200 mg by mouth as needed., Disp: , Rfl:  .  latanoprost (XALATAN) 0.005 % ophthalmic solution, Place 1 drop into both eyes at bedtime., Disp: , Rfl:  .  Levothyroxine Sodium 25 MCG CAPS, Take 1 capsule (25 mcg total) by mouth daily before breakfast., Disp: 90 capsule, Rfl: 1 .  Multiple Vitamins-Minerals (SENIOR MULTIVITAMIN PLUS PO), Take by mouth every morning., Disp: , Rfl:  .  olmesartan (BENICAR) 20 MG tablet, Take 1 tablet (20 mg total) by mouth daily., Disp: 90 tablet, Rfl: 1 .  Omega-3 Fatty Acids (FISH OIL) 1000 MG CAPS, Take 1,000 capsules by mouth 2 (two) times daily., Disp: , Rfl:  .  Polyethyl Glycol-Propyl Glycol (SYSTANE OP), Apply to eye., Disp: , Rfl:  .  simvastatin (ZOCOR) 20 MG tablet, Take 1 tablet (20 mg total) by mouth daily., Disp: 90 tablet, Rfl: 1  Allergies: No Known Allergies  Past Medical History, Surgical  history, Social history, and Family History were reviewed and updated.  Review of Systems: As above  Physical Exam:  height is 5' (1.524 m) and weight is 165 lb (74.844 kg). Her oral temperature is 97.9 F (36.6 C). Her blood pressure is 149/62 and her pulse is 75. Her respiration is 14.   Elderly, petite white female in no obvious distress. Head and neck exam shows no ocular or oral lesions. There are no palpable cervical or supraclavicular lymph nodes. Lungs are clear. Cardiac exam regular rate and rhythm with no murmurs, rubs or bruits. Axillary exam shows no bilateral axillary adenopathy. Abdomen is soft. She has good bowel sound per there is no fluid wave. There is no palpable liver or spleen tip. Back exam shows some slight kyphosis. No tenderness is noted over the spine, ribs or hips. Externally shows no clubbing, cyanosis or edema. She has good range of motion of her joints. She has good strength. Skin exam no rashes, ecchymoses or petechia. Neurological exam shows no focal neurological deficits.  Lab Results  Component Value Date   WBC 11.7* 05/24/2014   HGB 13.5 05/24/2014   HCT 40.4 05/24/2014   MCV 95 05/24/2014   PLT 201 05/24/2014     Chemistry      Component Value Date/Time   NA 142 05/24/2014 1124   NA 140 04/24/2014 1131   K 4.2 05/24/2014 1124  K 4.4 04/24/2014 1131   CL 102 05/24/2014 1124   CL 102 04/24/2014 1131   CO2 26 05/24/2014 1124   CO2 21 04/24/2014 1131   BUN 19 05/24/2014 1124   BUN 17 04/24/2014 1131   CREATININE 0.9 05/24/2014 1124   CREATININE 0.91 04/24/2014 1131      Component Value Date/Time   CALCIUM 9.4 05/24/2014 1124   CALCIUM 9.3 04/24/2014 1131   ALKPHOS 84 05/24/2014 1124   ALKPHOS 77 04/24/2014 1131   AST 19 05/24/2014 1124   AST 23 04/24/2014 1131   ALT 9* 05/24/2014 1124   ALT 10 04/24/2014 1131   BILITOT 0.80 05/24/2014 1124   BILITOT 0.8 04/24/2014 1131         Impression and Plan: Krystal [name] is 81 year old  white female with stage A CLL. Her white cell count really has not changed in 6 months.  I think we are probably get her back in another 6 months.  I do not see a need for any additional blood work. She does not need a bone marrow test. There is no indication for any scans.   [name], MD 3/2/201612:47 PM

## 2014-05-26 LAB — PROTEIN ELECTROPHORESIS, SERUM, WITH REFLEX
ALBUMIN ELP: 62.1 % (ref 55.8–66.1)
ALPHA-2-GLOBULIN: 11.7 % (ref 7.1–11.8)
Alpha-1-Globulin: 4.7 % (ref 2.9–4.9)
Beta 2: 4.3 % (ref 3.2–6.5)
Beta Globulin: 6.8 % (ref 4.7–7.2)
Gamma Globulin: 10.4 % — ABNORMAL LOW (ref 11.1–18.8)
TOTAL PROTEIN, SERUM ELECTROPHOR: 6.2 g/dL (ref 6.0–8.3)

## 2014-05-26 LAB — IGG, IGA, IGM
IGG (IMMUNOGLOBIN G), SERUM: 1090 mg/dL (ref 690–1700)
IgA: 77 mg/dL (ref 69–380)
IgM, Serum: 64 mg/dL (ref 52–322)

## 2014-05-26 LAB — BETA 2 MICROGLOBULIN, SERUM: Beta-2 Microglobulin: 2.85 mg/L — ABNORMAL HIGH (ref <=2.51)

## 2014-06-07 ENCOUNTER — Ambulatory Visit: Payer: Medicare Other | Admitting: Hematology & Oncology

## 2014-06-07 ENCOUNTER — Other Ambulatory Visit: Payer: Medicare Other | Admitting: Lab

## 2014-07-05 DIAGNOSIS — Z85828 Personal history of other malignant neoplasm of skin: Secondary | ICD-10-CM | POA: Diagnosis not present

## 2014-07-05 DIAGNOSIS — D485 Neoplasm of uncertain behavior of skin: Secondary | ICD-10-CM | POA: Diagnosis not present

## 2014-07-05 DIAGNOSIS — L821 Other seborrheic keratosis: Secondary | ICD-10-CM | POA: Diagnosis not present

## 2014-07-05 DIAGNOSIS — D1801 Hemangioma of skin and subcutaneous tissue: Secondary | ICD-10-CM | POA: Diagnosis not present

## 2014-07-05 DIAGNOSIS — L304 Erythema intertrigo: Secondary | ICD-10-CM | POA: Diagnosis not present

## 2014-07-05 DIAGNOSIS — D2339 Other benign neoplasm of skin of other parts of face: Secondary | ICD-10-CM | POA: Diagnosis not present

## 2014-07-20 ENCOUNTER — Encounter: Payer: Self-pay | Admitting: Internal Medicine

## 2014-07-20 DIAGNOSIS — E785 Hyperlipidemia, unspecified: Secondary | ICD-10-CM | POA: Insufficient documentation

## 2014-07-20 DIAGNOSIS — R739 Hyperglycemia, unspecified: Secondary | ICD-10-CM | POA: Insufficient documentation

## 2014-07-20 DIAGNOSIS — M792 Neuralgia and neuritis, unspecified: Secondary | ICD-10-CM | POA: Insufficient documentation

## 2014-07-31 ENCOUNTER — Ambulatory Visit (INDEPENDENT_AMBULATORY_CARE_PROVIDER_SITE_OTHER): Payer: Medicare Other | Admitting: Internal Medicine

## 2014-07-31 ENCOUNTER — Encounter: Payer: Self-pay | Admitting: Internal Medicine

## 2014-07-31 VITALS — BP 122/70 | HR 70 | Temp 97.8°F | Resp 16 | Ht <= 58 in | Wt 163.0 lb

## 2014-07-31 DIAGNOSIS — I10 Essential (primary) hypertension: Secondary | ICD-10-CM

## 2014-07-31 DIAGNOSIS — E785 Hyperlipidemia, unspecified: Secondary | ICD-10-CM

## 2014-07-31 DIAGNOSIS — C9110 Chronic lymphocytic leukemia of B-cell type not having achieved remission: Secondary | ICD-10-CM | POA: Diagnosis not present

## 2014-07-31 DIAGNOSIS — M7521 Bicipital tendinitis, right shoulder: Secondary | ICD-10-CM

## 2014-07-31 DIAGNOSIS — E039 Hypothyroidism, unspecified: Secondary | ICD-10-CM | POA: Diagnosis not present

## 2014-07-31 DIAGNOSIS — G3184 Mild cognitive impairment, so stated: Secondary | ICD-10-CM | POA: Diagnosis not present

## 2014-07-31 DIAGNOSIS — M79651 Pain in right thigh: Secondary | ICD-10-CM | POA: Diagnosis not present

## 2014-07-31 NOTE — Patient Instructions (Signed)
Reminder- Bring copy of Living Will/ Health Care Power of Attorney to next appointment

## 2014-07-31 NOTE — Progress Notes (Signed)
Failed clock drawing  

## 2014-07-31 NOTE — Progress Notes (Signed)
Patient ID: Krystal [name], female   DOB: [birth year], 81 y.o.   MRN: [id number]   Location:  [facility]  Goals of Care: Advanced Directive information Does patient have an advance directive?: Yes, Type of Advance Directive: [place], Does patient want to make changes to advanced directive?: No - Patient declined   No Known Allergies  Chief Complaint  Patient presents with  . Annual Exam    Yearly check-up, labs completed 04/24/2014 (fasting if any additional labs due). Last EKG 2014   . MMSE    25/30, failed clock drawing    HPI: Patient is a 81 y.o. female seen in the office today for her annual exam.    Has low back pain across SI joints and has had prior lumbar xrays showing arthritis.  Goes down right leg.  Using otc advil occasionally with benefit if walking more.  Does not want further imaging.  Says pain is not bad enough to take prescription medications.  Right shoulder hurts her when she reaches behind her neck ever since she lifted a heavy pot one time.  Tender over biceps.  Cscope in 2010 was benign and does not need anymore. Had partial hysterectomy.  Bone density 2010 and 2014. Has osteoporosis and her daughter thinks she did take a bisphosphonate in the past. Mammogram 2010. All have been normal.  Had pneumovax in 2014 Had flu shot in October 2015. Had tdap 2014 Has living will but did not bring copy. MMSE - Mini Mental State Exam 07/31/2014  Orientation to time 4  Orientation to Place 4  Registration 3  Attention/ Calculation 5  Recall 1  Language- name 2 objects 2  Language- repeat 1  Language- follow 3 step command 3  Language- read & follow direction 1  Write a sentence 1  Copy design 0  Total score 25  Failed clock.  Last time, I noted her daughter mentioned some increased difficulty with memory--difficulty providing history.  No falls, but feels her balance is poor.  Likes to have  something to hold onto when going up and downstairs and getting in and out of the tub.  Feels wobbly like a drunk even walking on flat surfaces.  No vertigo.  Does get a little lightheaded.  Can go off to either side.    Does not want to use assistive device or get therapy.  Review of Systems:  Review of Systems  Constitutional: Negative for fever, chills and malaise/fatigue.  HENT: Negative for congestion and hearing loss.   Eyes: Negative for blurred vision.  Respiratory: Negative for shortness of breath.   Cardiovascular: Negative for chest pain and leg swelling.  Gastrointestinal: Negative for abdominal pain, constipation, blood in stool and melena.  Genitourinary: Negative for dysuria.  Musculoskeletal: Positive for back pain and joint pain. Negative for falls.  Neurological: Positive for dizziness. Negative for weakness.  Psychiatric/Behavioral: Positive for memory loss.     Past Medical History  Diagnosis Date  . Hypertension   . Hyperlipidemia   . Thyroid disease   . Arthritis   . Osteoporosis   . Glaucoma     Past Surgical History  Procedure Laterality Date  . Abdominal hysterectomy  1965    partial  . Cholecystectomy  1989  . Skin surgery  02/2014    Basel Cell removed from right side of face     Social History:   reports that she has never smoked. She has never used  smokeless tobacco. She reports that she does not drink alcohol or use illicit drugs.  Family History  Problem Relation Age of Onset  . Cancer Father     lung  . Heart disease Father   . Cancer Mother     Medications: Patient's Medications  New Prescriptions   No medications on file  Previous Medications   AMLODIPINE (NORVASC) 5 MG TABLET    Take 1 tablet (5 mg total) by mouth daily.   ASPIRIN EC 81 MG TABLET    Take 1 tablet (81 mg total) by mouth daily.   CHOLECALCIFEROL (VITAMIN D) 2000 UNITS CAPS    Take 1 capsule (2,000 Units total) by mouth daily.   IBUPROFEN (ADVIL,MOTRIN) 200 MG  TABLET    Take 200 mg by mouth as needed.   LATANOPROST (XALATAN) 0.005 % OPHTHALMIC SOLUTION    Place 1 drop into both eyes at bedtime.   LEVOTHYROXINE SODIUM 25 MCG CAPS    Take 1 capsule (25 mcg total) by mouth daily before breakfast.   MULTIPLE VITAMINS-MINERALS (SENIOR MULTIVITAMIN PLUS PO)    Take by mouth every morning.   OLMESARTAN (BENICAR) 20 MG TABLET    Take 1 tablet (20 mg total) by mouth daily.   OMEGA-3 FATTY ACIDS (FISH OIL) 1000 MG CAPS    Take 1,000 capsules by mouth 2 (two) times daily.   POLYETHYL GLYCOL-PROPYL GLYCOL (SYSTANE OP)    Apply to eye.   SIMVASTATIN (ZOCOR) 20 MG TABLET    Take 1 tablet (20 mg total) by mouth daily.  Modified Medications   No medications on file  Discontinued Medications   No medications on file     Physical Exam: Filed Vitals:   07/31/14 0908  BP: 122/70  Pulse: 70  Temp: 97.8 F (36.6 C)  TempSrc: Oral  Resp: 16  Height: 4' 9.5" (1.461 m)  Weight: 163 lb (73.936 kg)  SpO2: 96%  Physical Exam  Constitutional: She is oriented to person, place, and time. She appears well-developed and well-nourished. No distress.  HENT:  Head: Normocephalic and atraumatic.  Right Ear: External ear normal.  Left Ear: External ear normal.  Nose: Nose normal.  Mouth/Throat: Oropharynx is clear and moist. No oropharyngeal exudate.  Eyes: Conjunctivae and EOM are normal. Pupils are equal, round, and reactive to light.  Neck: Normal range of motion. Neck supple. No JVD present. No tracheal deviation present. No thyromegaly present.  Cardiovascular: Normal rate, regular rhythm, normal heart sounds and intact distal pulses.   Pulmonary/Chest: Effort normal and breath sounds normal. No respiratory distress. Right breast exhibits no inverted nipple, no mass, no nipple discharge, no skin change and no tenderness. Left breast exhibits no inverted nipple, no mass, no nipple discharge, no skin change and no tenderness.  Abdominal: Soft. Bowel sounds are normal.  She exhibits no distension and no mass. There is no tenderness.  Musculoskeletal: Normal range of motion. She exhibits edema and tenderness.  Of right shoulder over biceps tendon insertion  Neurological: She is alert and oriented to person, place, and time. She has normal reflexes. No cranial nerve deficit.  Skin: Skin is warm and dry.  Psychiatric: She has a normal mood and affect.     Labs reviewed: Basic Metabolic Panel:  Recent Labs  04/24/14 1131 05/24/14 1124  NA 140 142  K 4.4 4.2  CL 102 102  CO2 21 26  GLUCOSE 92 97  BUN 17 19  CREATININE 0.91 0.9  CALCIUM 9.3 9.4  TSH 3.360  --  Liver Function Tests:  Recent Labs  04/24/14 1131 05/24/14 1124  AST 23 19  ALT 10 9*  ALKPHOS 77 84  BILITOT 0.8 0.80  PROT 6.0 6.5   No results for input(s): LIPASE, AMYLASE in the last 8760 hours. No results for input(s): AMMONIA in the last 8760 hours. CBC:  Recent Labs  11/23/13 1312 04/24/14 1131 05/24/14 1124  WBC 10.9* 9.8 11.7*  NEUTROABS 3.1 2.9 3.2  HGB 14.0 13.3 13.5  HCT 41.4 39.2 40.4  MCV 95 91 95  PLT 193 208 201   Lipid Panel:  Recent Labs  04/24/14 1131  CHOL 141  HDL 69  LDLCALC 62  TRIG 51  CHOLHDL 2.0   Lab Results  Component Value Date   HGBA1C 5.9* 04/24/2014    Assessment/Plan 1. Biceps tendonitis, right -seems to be cause of her right shoulder pain--does not seem to be rotator cuff though she also likely has OA in the shoulder  2. Right thigh pain -due to back pain I suspect -has known SI joint OA  3. Mild cognitive impairment with memory loss -MMSE 25/30 failing clock -her daughter is helping her with some IADLs, but otherwise she is independent  4. CLL (chronic lymphocytic leukemia) -follow up with Dr. Marin Olp as planned, has been stable and does not want treatments  5. Essential hypertension, benign -bp at goal, no changes -monitor for orthostatic hypotension  6. Hyperlipidemia -lipids at goal with simvastatin,  continue this  7. Hypothyroidism, unspecified hypothyroidism type -TSH at goal with current levothyroxine--no changes   Labs/tests ordered:  Orders Placed This Encounter  Procedures  . CBC with Differential/Platelet    Standing Status: Future     Number of Occurrences:      Standing Expiration Date: 07/31/2015  . Comprehensive metabolic panel    Standing Status: Future     Number of Occurrences:      Standing Expiration Date: 07/31/2015    Order Specific Question:  Has the patient fasted?    Answer:  Yes  . Lipid panel    Standing Status: Future     Number of Occurrences:      Standing Expiration Date: 07/31/2015    Order Specific Question:  Has the patient fasted?    Answer:  Yes  . TSH    Standing Status: Future     Number of Occurrences:      Standing Expiration Date: 07/31/2015    Next appt:  6 mos with labs before  [place]. Shelita Steptoe, D.O. [facility] [address], [place] [address] Cell Phone (Mon-Fri 8am-5pm):  [phone] On Call:  [phone] & follow prompts after 5pm & weekends Office Phone:  [phone] Office Fax:  [phone]

## 2014-08-14 DIAGNOSIS — H4011X3 Primary open-angle glaucoma, severe stage: Secondary | ICD-10-CM | POA: Diagnosis not present

## 2014-10-19 ENCOUNTER — Other Ambulatory Visit: Payer: Self-pay | Admitting: Internal Medicine

## 2014-11-22 ENCOUNTER — Other Ambulatory Visit (HOSPITAL_BASED_OUTPATIENT_CLINIC_OR_DEPARTMENT_OTHER): Payer: Medicare Other

## 2014-11-22 ENCOUNTER — Encounter: Payer: Self-pay | Admitting: Family

## 2014-11-22 ENCOUNTER — Ambulatory Visit (HOSPITAL_BASED_OUTPATIENT_CLINIC_OR_DEPARTMENT_OTHER): Payer: Medicare Other | Admitting: Family

## 2014-11-22 VITALS — BP 149/62 | HR 80 | Temp 97.7°F | Resp 16 | Wt 165.0 lb

## 2014-11-22 DIAGNOSIS — C9110 Chronic lymphocytic leukemia of B-cell type not having achieved remission: Secondary | ICD-10-CM

## 2014-11-22 LAB — CBC WITH DIFFERENTIAL (CANCER CENTER ONLY)
BASO#: 0 10*3/uL (ref 0.0–0.2)
BASO%: 0.2 % (ref 0.0–2.0)
EOS%: 1.8 % (ref 0.0–7.0)
Eosinophils Absolute: 0.2 10*3/uL (ref 0.0–0.5)
HEMATOCRIT: 38.9 % (ref 34.8–46.6)
HEMOGLOBIN: 13.2 g/dL (ref 11.6–15.9)
LYMPH#: 6.9 10*3/uL — ABNORMAL HIGH (ref 0.9–3.3)
LYMPH%: 60.6 % — ABNORMAL HIGH (ref 14.0–48.0)
MCH: 31.7 pg (ref 26.0–34.0)
MCHC: 33.9 g/dL (ref 32.0–36.0)
MCV: 93 fL (ref 81–101)
MONO#: 1 10*3/uL — ABNORMAL HIGH (ref 0.1–0.9)
MONO%: 9.1 % (ref 0.0–13.0)
NEUT%: 28.3 % — ABNORMAL LOW (ref 39.6–80.0)
NEUTROS ABS: 3.2 10*3/uL (ref 1.5–6.5)
Platelets: 205 10*3/uL (ref 145–400)
RBC: 4.17 10*6/uL (ref 3.70–5.32)
RDW: 13.1 % (ref 11.1–15.7)
WBC: 11.3 10*3/uL — AB (ref 3.9–10.0)

## 2014-11-22 LAB — CHCC SATELLITE - SMEAR

## 2014-11-22 LAB — COMPREHENSIVE METABOLIC PANEL (CC13)
ALBUMIN: 3.7 g/dL (ref 3.5–5.0)
ALK PHOS: 73 U/L (ref 40–150)
ALT: 9 U/L (ref 0–55)
AST: 20 U/L (ref 5–34)
Anion Gap: 6 mEq/L (ref 3–11)
BUN: 12.6 mg/dL (ref 7.0–26.0)
CALCIUM: 9 mg/dL (ref 8.4–10.4)
CO2: 26 mEq/L (ref 22–29)
CREATININE: 0.9 mg/dL (ref 0.6–1.1)
Chloride: 108 mEq/L (ref 98–109)
EGFR: 54 mL/min/{1.73_m2} — ABNORMAL LOW (ref >90)
Glucose: 96 mg/dl (ref 70–140)
Potassium: 4.3 mEq/L (ref 3.5–5.1)
SODIUM: 140 meq/L (ref 136–145)
TOTAL PROTEIN: 6.2 g/dL — AB (ref 6.4–8.3)
Total Bilirubin: 0.81 mg/dL (ref 0.20–1.20)

## 2014-11-22 NOTE — Progress Notes (Signed)
Hematology and Oncology Follow Up Visit  Krystal [name] [id number] 12-Jan-1926 81 y.o. 11/22/2014   Principle Diagnosis:  Stage A CLL  Current Therapy:   Observation    Interim History:  Krystal [name] is here today with her daughter for a follow-up. She is doing quite well. She has had mild fatigue at times due to the summer heat. She is looking forward to the fall.  There has been no issue with infections. No fever, chills, n/v, cough, rash, dizziness, SOB, chest pain, palpitations, abdominal pain, changes in bowel or bladder habits. She has not noticed any blood in her urine or stool.  No lymphadenopathy found on exam.  No tenderness in her extremities. She does have chronic swelling in her ankles that does improve with elevation. She states that this it not a new issues for her.  She has mild neuropathy in her hands and feet she describes as tolerable. This has not effected her dexterity or gait. She has had no falls.  She has a good appetite and is eating well. She is staying hydrated. Her weight is stable.   Medications:    Medication List       This list is accurate as of: 11/22/14 10:09 AM.  Always use your most recent med list.               amLODipine 5 MG tablet  Commonly known as:  NORVASC  Take 1 tablet (5 mg total) by mouth daily.     aspirin EC 81 MG tablet  Take 1 tablet (81 mg total) by mouth daily.     Fish Oil 1000 MG Caps  Take 1,000 capsules by mouth 2 (two) times daily.     ibuprofen 200 MG tablet  Commonly known as:  ADVIL,MOTRIN  Take 200 mg by mouth as needed.     latanoprost 0.005 % ophthalmic solution  Commonly known as:  XALATAN  Place 1 drop into both eyes at bedtime.     Levothyroxine Sodium 25 MCG Caps  Take 1 capsule (25 mcg total) by mouth daily before breakfast.     olmesartan 20 MG tablet  Commonly known as:  BENICAR  Take 1 tablet (20 mg total) by mouth daily.     SENIOR MULTIVITAMIN PLUS PO  Take by mouth every morning.     simvastatin 20 MG tablet  Commonly known as:  ZOCOR  TAKE 1 TABLET (20 MG TOTAL) BY MOUTH DAILY.     SYSTANE OP  Apply to eye.     Vitamin D 2000 UNITS Caps  Take 1 capsule (2,000 Units total) by mouth daily.        Allergies: No Known Allergies  Past Medical History, Surgical history, Social history, and Family History were reviewed and updated.  Review of Systems: All other 10 point review of systems is negative.   Physical Exam:  vitals were not taken for this visit.  Wt Readings from Last 3 Encounters:  07/31/14 163 lb (73.936 kg)  05/24/14 165 lb (74.844 kg)  04/24/14 164 lb (74.39 kg)    Ocular: Sclerae unicteric, pupils equal, round and reactive to light Ear-nose-throat: Oropharynx clear, dentition fair Lymphatic: No cervical or supraclavicular adenopathy Lungs no rales or rhonchi, good excursion bilaterally Heart regular rate and rhythm, no murmur appreciated Abd soft, nontender, positive bowel sounds MSK no focal spinal tenderness, no joint edema Neuro: non-focal, well-oriented, appropriate affect Breasts: Deferred  Lab Results  Component Value Date   WBC 11.7* 05/24/2014  HGB 13.5 05/24/2014   HCT 40.4 05/24/2014   MCV 95 05/24/2014   PLT 201 05/24/2014   No results found for: FERRITIN, IRON, TIBC, UIBC, IRONPCTSAT Lab Results  Component Value Date   RBC 4.27 05/24/2014   No results found for: Krystal [name] [facility] Lab Results  Component Value Date   IGGSERUM 1090 05/24/2014   IGA 77 05/24/2014   IGMSERUM 64 05/24/2014   Lab Results  Component Value Date   TOTALPROTELP 6.2 05/24/2014   ALBUMINELP 62.1 05/24/2014   A1GS 4.7 05/24/2014   A2GS 11.7 05/24/2014   BETS 6.8 05/24/2014   BETA2SER 4.3 05/24/2014   GAMS 10.4* 05/24/2014   MSPIKE NOT DET 05/24/2014   SPEI * 05/24/2014     Chemistry      Component Value Date/Time   NA 142 05/24/2014 1124   NA 140 04/24/2014 1131   K 4.2 05/24/2014 1124   K 4.4 04/24/2014  1131   CL 102 05/24/2014 1124   CL 102 04/24/2014 1131   CO2 26 05/24/2014 1124   CO2 21 04/24/2014 1131   BUN 19 05/24/2014 1124   BUN 17 04/24/2014 1131   CREATININE 0.9 05/24/2014 1124   CREATININE 0.91 04/24/2014 1131      Component Value Date/Time   CALCIUM 9.4 05/24/2014 1124   CALCIUM 9.3 04/24/2014 1131   ALKPHOS 84 05/24/2014 1124   ALKPHOS 77 04/24/2014 1131   AST 19 05/24/2014 1124   AST 23 04/24/2014 1131   ALT 9* 05/24/2014 1124   ALT 10 04/24/2014 1131   BILITOT 0.80 05/24/2014 1124   BILITOT 0.8 04/24/2014 1131     Impression and Plan: [name] is a pleasant 81 yo white female with stage A CLL. So far this has not been an issue for her. Her WBC count remains stable at 11.3. No anemia and platelet count is 205. She is doing well and is asymptomatic at this time.  We will continue to follow along with her and plan to see her back in 6 months for labs and follow-up.  She knows to contact us with any questions or concerns. We can certainly see her sooner if need be.   Krystal Bottom, NP 8/31/201610:09 AM

## 2014-12-19 DIAGNOSIS — H1859 Other hereditary corneal dystrophies: Secondary | ICD-10-CM | POA: Diagnosis not present

## 2014-12-19 DIAGNOSIS — H4011X3 Primary open-angle glaucoma, severe stage: Secondary | ICD-10-CM | POA: Diagnosis not present

## 2014-12-19 DIAGNOSIS — H43813 Vitreous degeneration, bilateral: Secondary | ICD-10-CM | POA: Diagnosis not present

## 2014-12-19 DIAGNOSIS — H52203 Unspecified astigmatism, bilateral: Secondary | ICD-10-CM | POA: Diagnosis not present

## 2014-12-27 ENCOUNTER — Other Ambulatory Visit: Payer: Self-pay | Admitting: Internal Medicine

## 2014-12-29 ENCOUNTER — Other Ambulatory Visit: Payer: Self-pay | Admitting: *Deleted

## 2014-12-29 DIAGNOSIS — E039 Hypothyroidism, unspecified: Secondary | ICD-10-CM

## 2014-12-29 DIAGNOSIS — I10 Essential (primary) hypertension: Secondary | ICD-10-CM

## 2014-12-29 MED ORDER — AMLODIPINE BESYLATE 5 MG PO TABS: 5.0000 mg | ORAL_TABLET | Freq: Every day | ORAL | Status: DC

## 2014-12-29 MED ORDER — LEVOTHYROXINE SODIUM 25 MCG PO CAPS: 1.0000 | ORAL_CAPSULE | Freq: Every day | ORAL | Status: DC

## 2014-12-29 MED ORDER — OLMESARTAN MEDOXOMIL 20 MG PO TABS: 20.0000 mg | ORAL_TABLET | Freq: Every day | ORAL | Status: DC

## 2014-12-29 NOTE — Telephone Encounter (Signed)
[name], caregiver called and requested Rx's to be faxed to pharmacy. Faxed.

## 2015-01-03 DIAGNOSIS — D1801 Hemangioma of skin and subcutaneous tissue: Secondary | ICD-10-CM | POA: Diagnosis not present

## 2015-01-03 DIAGNOSIS — L304 Erythema intertrigo: Secondary | ICD-10-CM | POA: Diagnosis not present

## 2015-01-03 DIAGNOSIS — L812 Freckles: Secondary | ICD-10-CM | POA: Diagnosis not present

## 2015-01-03 DIAGNOSIS — D225 Melanocytic nevi of trunk: Secondary | ICD-10-CM | POA: Diagnosis not present

## 2015-01-03 DIAGNOSIS — L821 Other seborrheic keratosis: Secondary | ICD-10-CM | POA: Diagnosis not present

## 2015-01-03 DIAGNOSIS — Z85828 Personal history of other malignant neoplasm of skin: Secondary | ICD-10-CM | POA: Diagnosis not present

## 2015-01-29 ENCOUNTER — Other Ambulatory Visit: Payer: Medicare Other

## 2015-01-29 DIAGNOSIS — C9110 Chronic lymphocytic leukemia of B-cell type not having achieved remission: Secondary | ICD-10-CM | POA: Diagnosis not present

## 2015-01-29 DIAGNOSIS — E039 Hypothyroidism, unspecified: Secondary | ICD-10-CM

## 2015-01-29 DIAGNOSIS — E785 Hyperlipidemia, unspecified: Secondary | ICD-10-CM

## 2015-01-29 DIAGNOSIS — I10 Essential (primary) hypertension: Secondary | ICD-10-CM

## 2015-01-30 LAB — CBC WITH DIFFERENTIAL/PLATELET
Basophils Absolute: 0 10*3/uL (ref 0.0–0.2)
Basos: 0 %
EOS (ABSOLUTE): 0.2 10*3/uL (ref 0.0–0.4)
Eos: 2 %
Hematocrit: 39.2 % (ref 34.0–46.6)
Hemoglobin: 13.2 g/dL (ref 11.1–15.9)
Immature Grans (Abs): 0 10*3/uL (ref 0.0–0.1)
Immature Granulocytes: 0 %
Lymphocytes Absolute: 6.4 10*3/uL — ABNORMAL HIGH (ref 0.7–3.1)
Lymphs: 61 %
MCH: 31.3 pg (ref 26.6–33.0)
MCHC: 33.7 g/dL (ref 31.5–35.7)
MCV: 93 fL (ref 79–97)
Monocytes Absolute: 0.7 10*3/uL (ref 0.1–0.9)
Monocytes: 7 %
Neutrophils Absolute: 3.2 10*3/uL (ref 1.4–7.0)
Neutrophils: 30 %
Platelets: 209 10*3/uL (ref 150–379)
RBC: 4.22 x10E6/uL (ref 3.77–5.28)
RDW: 14.2 % (ref 12.3–15.4)
WBC: 10.6 10*3/uL (ref 3.4–10.8)

## 2015-01-30 LAB — COMPREHENSIVE METABOLIC PANEL
ALT: 14 IU/L (ref 0–32)
AST: 23 IU/L (ref 0–40)
Albumin/Globulin Ratio: 2.1 (ref 1.1–2.5)
Albumin: 4 g/dL (ref 3.5–4.7)
Alkaline Phosphatase: 67 IU/L (ref 39–117)
BUN/Creatinine Ratio: 13 (ref 11–26)
BUN: 12 mg/dL (ref 8–27)
Bilirubin Total: 0.7 mg/dL (ref 0.0–1.2)
CO2: 24 mmol/L (ref 18–29)
Calcium: 9 mg/dL (ref 8.7–10.3)
Chloride: 103 mmol/L (ref 97–106)
Creatinine, Ser: 0.94 mg/dL (ref 0.57–1.00)
GFR calc Af Amer: 63 mL/min/{1.73_m2} (ref >59)
GFR calc non Af Amer: 55 mL/min/{1.73_m2} — ABNORMAL LOW (ref >59)
Globulin, Total: 1.9 g/dL (ref 1.5–4.5)
Glucose: 93 mg/dL (ref 65–99)
Potassium: 4.3 mmol/L (ref 3.5–5.2)
Sodium: 142 mmol/L (ref 136–144)
Total Protein: 5.9 g/dL — ABNORMAL LOW (ref 6.0–8.5)

## 2015-01-30 LAB — LIPID PANEL
Chol/HDL Ratio: 2.4 ratio units (ref 0.0–4.4)
Cholesterol, Total: 146 mg/dL (ref 100–199)
HDL: 61 mg/dL (ref >39)
LDL Calculated: 71 mg/dL (ref 0–99)
Triglycerides: 72 mg/dL (ref 0–149)
VLDL Cholesterol Cal: 14 mg/dL (ref 5–40)

## 2015-01-30 LAB — TSH: TSH: 4.48 u[IU]/mL (ref 0.450–4.500)

## 2015-02-02 ENCOUNTER — Encounter: Payer: Self-pay | Admitting: Internal Medicine

## 2015-02-02 ENCOUNTER — Ambulatory Visit (INDEPENDENT_AMBULATORY_CARE_PROVIDER_SITE_OTHER): Payer: Medicare Other | Admitting: Internal Medicine

## 2015-02-02 VITALS — BP 124/72 | HR 70 | Temp 97.6°F | Wt 165.0 lb

## 2015-02-02 DIAGNOSIS — I10 Essential (primary) hypertension: Secondary | ICD-10-CM | POA: Diagnosis not present

## 2015-02-02 DIAGNOSIS — G3184 Mild cognitive impairment, so stated: Secondary | ICD-10-CM | POA: Diagnosis not present

## 2015-02-02 DIAGNOSIS — M8580 Other specified disorders of bone density and structure, unspecified site: Secondary | ICD-10-CM

## 2015-02-02 DIAGNOSIS — H409 Unspecified glaucoma: Secondary | ICD-10-CM | POA: Diagnosis not present

## 2015-02-02 DIAGNOSIS — E785 Hyperlipidemia, unspecified: Secondary | ICD-10-CM

## 2015-02-02 DIAGNOSIS — M79651 Pain in right thigh: Secondary | ICD-10-CM

## 2015-02-02 DIAGNOSIS — Z23 Encounter for immunization: Secondary | ICD-10-CM | POA: Diagnosis not present

## 2015-02-02 DIAGNOSIS — M7521 Bicipital tendinitis, right shoulder: Secondary | ICD-10-CM | POA: Diagnosis not present

## 2015-02-02 DIAGNOSIS — M899 Disorder of bone, unspecified: Secondary | ICD-10-CM | POA: Diagnosis not present

## 2015-02-02 DIAGNOSIS — E039 Hypothyroidism, unspecified: Secondary | ICD-10-CM

## 2015-02-02 NOTE — Progress Notes (Signed)
Patient ID: Krystal [name], female   DOB: 03-28-1925, 81 y.o.   MRN: [id number]   Location: [facility] Provider: Rexene Edison. Mariea Clonts, D.O., C.M.D.  Code Status: DNR Goals of Care: Advanced Directive information Does patient have an advance directive?: Yes, Type of Advance Directive: [place];Living will;Out of facility DNR (pink MOST or yellow form), Pre-existing out of facility DNR order (yellow form or pink MOST form): Yellow form placed in chart (order not valid for inpatient use), Does patient want to make changes to advanced directive?: No - Patient declined  Chief Complaint  Patient presents with  . Medical Management of Chronic Issues    blood pressure, thyroid, cholesterol. Here with daughter Krystal [name]  . Handicapped    would like to get handicapped form sign    HPI: Patient is a 81 y.o. female seen in the office today for medical mgt of chronic diseases.  Pt says no new complaints.  They need a new handicapped sticker.  Pt does still drive.    Right arm still painful in the shoulder.  Hurts sometimes.  Uses it normally except not to pick up anything heavy.Can't reach behind her.  Won't go that way, does hurt minorly.    Right thigh pain still happens--there most of the time.  Burning and tingly.  If she's goig to walk a lot, she'll take some advil.  Very rare that she takes anything per her daughter, Krystal [name].    Saw NP at hematology the last time in august re: CLL. Stage A.  Stable at 11.3 and no symptoms.  They are continuing to monitor.  Note was reviewed.  Says her memory is about the same.  Krystal [name] agrees it's not much different.  Remembered answers on the way home after the MMSE.  Good and bad days.  Difficulty with names and they come two days later.  Manages her own finances, gets some help with appts, pt does drive.  No difficulty with getting lost, no accidents.    Says her biggest problem is wobbly walking.  Not dizzy or lightheaded.  Can't walk close to  someone b/c she feels like she's going to bump them.  This is not new.  Has the peripheral neuropathy in hands and feet.    Not seeing the best...had her cataract surgery and not too happy with results--left eye bothers her a lot.  Happened about 3 years ago.  Sees Dr. Satira Sark for eyes.  Has glaucoma.  Pressures have been ok with the latanoprost.  Hearing:  Krystal [name] notes hearing is pretty good--does not have to talk loudly.    On and off sleeping at night--sometimes wakes up in the middle of the night and then falls asleep again.  Does feel rested in the daytime.  Rarely naps.  Is in good spirits, denies depression.  Her dog pulled her down one time.  She was in the yard at night with the dog.    Does not get a lot of exercise b/c she's unsteady with her walking.  Would consider a cane, but feels she's not at the point for a walker.  [place] wen tot the folk festival and she used a portable seat they used as a cane.  She is not interested in PT for her walking.  Review of Systems:  Review of Systems  Constitutional: Negative for fever, chills and malaise/fatigue.  HENT: Positive for hearing loss.   Eyes: Positive for blurred vision.       Glaucoma  Respiratory:  Negative for shortness of breath.   Cardiovascular: Positive for leg swelling. Negative for chest pain and palpitations.  Gastrointestinal: Negative for abdominal pain, constipation and blood in stool.  Genitourinary: Negative for dysuria.  Musculoskeletal: Positive for falls.       Refuses assistive device besides a cane; dog pulled her over once in the yard  Skin: Negative for rash.  Neurological: Negative for dizziness, loss of consciousness and headaches.  Endo/Heme/Allergies: Bruises/bleeds easily.  Psychiatric/Behavioral: Positive for memory loss. Negative for depression.    Past Medical History  Diagnosis Date  . Hypertension   . Hyperlipidemia   . Thyroid disease   . Arthritis   . Osteoporosis   . Glaucoma     Past  Surgical History  Procedure Laterality Date  . Abdominal hysterectomy  1965    partial  . Cholecystectomy  1989  . Skin surgery  02/2014    Basel Cell removed from right side of face     No Known Allergies    Medication List       This list is accurate as of: 02/02/15 11:59 PM.  Always use your most recent med list.               amLODipine 5 MG tablet  Commonly known as:  NORVASC  Take 1 tablet (5 mg total) by mouth daily.     aspirin EC 81 MG tablet  Take 1 tablet (81 mg total) by mouth daily.     Fish Oil 1000 MG Caps  Take 1,000 capsules by mouth 2 (two) times daily.     ibuprofen 200 MG tablet  Commonly known as:  ADVIL,MOTRIN  Take 200 mg by mouth as needed.     latanoprost 0.005 % ophthalmic solution  Commonly known as:  XALATAN  Place 1 drop into both eyes at bedtime.     Levothyroxine Sodium 25 MCG Caps  Take 1 capsule (25 mcg total) by mouth daily before breakfast.     olmesartan 20 MG tablet  Commonly known as:  BENICAR  Take 1 tablet (20 mg total) by mouth daily.     SENIOR MULTIVITAMIN PLUS PO  Take by mouth every morning.     simvastatin 20 MG tablet  Commonly known as:  ZOCOR  TAKE 1 TABLET (20 MG TOTAL) BY MOUTH DAILY.     SYSTANE OP  Apply to eye.     Vitamin D 2000 UNITS Caps  Take 1 capsule (2,000 Units total) by mouth daily.        Health Maintenance  Topic Date Due  . ZOSTAVAX  03/25/2015 (Originally 10/05/1987)  . INFLUENZA VACCINE  10/23/2015  . TETANUS/TDAP  03/24/2022  . DEXA SCAN  Completed  . PNA vac Low Risk Adult  Completed    Physical Exam: Filed Vitals:   02/02/15 1000  BP: 124/72  Pulse: 70  Temp: 97.6 F (36.4 C)  TempSrc: Oral  Weight: 165 lb (74.844 kg)  SpO2: 96%   Body mass index is 35.06 kg/(m^2). Physical Exam  Constitutional: She appears well-developed and well-nourished. No distress.  Cardiovascular: Normal rate, regular rhythm, normal heart sounds and intact distal pulses.   Pulmonary/Chest:  Effort normal and breath sounds normal. No respiratory distress.  Abdominal: Soft. Bowel sounds are normal. She exhibits no distension. There is no tenderness.  Musculoskeletal: Normal range of motion.  Wobbly gait without assistive device and uses arms to get up  Neurological: She is alert.  Skin: Skin is warm and dry.  There is pallor.  Psychiatric: She has a normal mood and affect.    Labs reviewed: Basic Metabolic Panel:  Recent Labs  04/24/14 1131 05/24/14 1124 11/22/14 1012 01/29/15 1029  NA 140 142 140 142  K 4.4 4.2 4.3 4.3  CL 102 102  --  103  CO2 21 26 26 24   GLUCOSE 92 97 96 93  BUN 17 19 12.6 12  CREATININE 0.91 0.9 0.9 0.94  CALCIUM 9.3 9.4 9.0 9.0  TSH 3.360  --   --  4.480   Liver Function Tests:  Recent Labs  05/24/14 1124 11/22/14 1012 01/29/15 1029  AST 19 20 23   ALT 9* 9 14  ALKPHOS 84 73 67  BILITOT 0.80 0.81 0.7  PROT 6.5 6.2* 5.9*  ALBUMIN 3.9 3.7 4.0   No results for input(s): LIPASE, AMYLASE in the last 8760 hours. No results for input(s): AMMONIA in the last 8760 hours. CBC:  Recent Labs  04/24/14 1131 05/24/14 1124 11/22/14 0958 01/29/15 1029  WBC 9.8 11.7* 11.3* 10.6  NEUTROABS 2.9 3.2 3.2 3.2  HGB 13.3 13.5 13.2  --   HCT 39.2 40.4 38.9 39.2  MCV 91 95 93  --   PLT 208 201 205  --    Lipid Panel:  Recent Labs  04/24/14 1131 01/29/15 1029  CHOL 141 146  HDL 69 61  LDLCALC 62 71  TRIG 51 72  CHOLHDL 2.0 2.4   Lab Results  Component Value Date   HGBA1C 5.9* 04/24/2014    Assessment/Plan 1. Senile osteopenia - needs f/u of bone density b/c it's been several years and needs to take her vitamin D 2000 units daily, encouraged weightbearing exercise - DG Bone Density; Future  2. Essential hypertension, benign -bp is well controlled with norvasc 5mg  daily and benicar 20mg  daily, cont same  3. Hyperlipidemia -lipids at goal with simvastatin, cont same, encouraged low fat diet and exercise  4. Hypothyroidism,  unspecified hypothyroidism type -TSH this month in normal range with synthroid 63mcg daily so cont same and recheck in 6 mos for annual exam - TSH; Future  5. Glaucoma -cont to f/u with ophthalmology and use latanoprost drops  6. Biceps tendonitis, right -ongoing, but she does not want an injection or any regular meds for this, rarely takes an ibuprofen  7. Right thigh pain -same here--does not want workup or treatment--sounds like this is radicular from her back  8. Mild cognitive impairment with memory loss -cont to monitor, but she functions quite well and only has help with remembering appts, picking up pills -reassess at annual  9. Need for prophylactic vaccination and inoculation against influenza -flu shot given  Labs/tests ordered:  Orders Placed This Encounter  Procedures  . DG Bone Density    Standing Status: Future     Number of Occurrences:      Standing Expiration Date: 04/03/2016    Order Specific Question:  Reason for Exam (SYMPTOM  OR DIAGNOSIS REQUIRED)    Answer:  osteopenia    Order Specific Question:  Preferred imaging location?    Answer:  [facility]  . Flu Vaccine QUAD 36+ mos IM  . TSH    Standing Status: Future     Number of Occurrences:      Standing Expiration Date: 02/02/2016    Next appt:  6 mos for annual wellness exam with mmse and tsh before   Aylin Rhoads L. Fatime Biswell, D.O. [facility] [address], [place] [address] Cell Phone (Mon-Fri 8am-5pm):  [phone] On Call:  [phone] & follow prompts after 5pm & weekends Office Phone:  [phone] Office Fax:  [phone]

## 2015-02-19 ENCOUNTER — Other Ambulatory Visit: Payer: Self-pay

## 2015-04-10 ENCOUNTER — Other Ambulatory Visit: Payer: Medicare Other

## 2015-04-10 ENCOUNTER — Ambulatory Visit
Admission: RE | Admit: 2015-04-10 | Discharge: 2015-04-10 | Disposition: A | Discharge status: Home / Self Care | Payer: Medicare Other | Source: Ambulatory Visit | Attending: Internal Medicine | Admitting: Internal Medicine

## 2015-04-10 DIAGNOSIS — M8580 Other specified disorders of bone density and structure, unspecified site: Secondary | ICD-10-CM

## 2015-04-10 DIAGNOSIS — M8589 Other specified disorders of bone density and structure, multiple sites: Secondary | ICD-10-CM | POA: Diagnosis not present

## 2015-04-23 ENCOUNTER — Other Ambulatory Visit: Payer: Self-pay | Admitting: Internal Medicine

## 2015-04-23 MED FILL — LATANOPROST 0.005% EYE DRP: 0.005 | 75 days supply | Qty: 8 | Fill #3

## 2015-04-23 MED FILL — SIMVASTATIN 20 MG TABLET: 20 | 90 days supply | Qty: 90 | Fill #0

## 2015-05-23 ENCOUNTER — Encounter: Payer: Self-pay | Admitting: Hematology & Oncology

## 2015-05-23 ENCOUNTER — Ambulatory Visit (HOSPITAL_BASED_OUTPATIENT_CLINIC_OR_DEPARTMENT_OTHER): Payer: Medicare Other | Admitting: Hematology & Oncology

## 2015-05-23 ENCOUNTER — Other Ambulatory Visit (HOSPITAL_BASED_OUTPATIENT_CLINIC_OR_DEPARTMENT_OTHER): Payer: Medicare Other

## 2015-05-23 VITALS — BP 151/63 | HR 81 | Temp 98.1°F | Resp 16 | Ht <= 58 in | Wt 162.0 lb

## 2015-05-23 DIAGNOSIS — C9110 Chronic lymphocytic leukemia of B-cell type not having achieved remission: Secondary | ICD-10-CM | POA: Diagnosis not present

## 2015-05-23 LAB — CBC WITH DIFFERENTIAL (CANCER CENTER ONLY)
BASO#: 0 10*3/uL (ref 0.0–0.2)
BASO%: 0.4 % (ref 0.0–2.0)
EOS ABS: 0.2 10*3/uL (ref 0.0–0.5)
EOS%: 2.3 % (ref 0.0–7.0)
HEMATOCRIT: 40.1 % (ref 34.8–46.6)
HGB: 13.7 g/dL (ref 11.6–15.9)
LYMPH#: 6.1 10*3/uL — AB (ref 0.9–3.3)
LYMPH%: 57.7 % — ABNORMAL HIGH (ref 14.0–48.0)
MCH: 31.7 pg (ref 26.0–34.0)
MCHC: 34.2 g/dL (ref 32.0–36.0)
MCV: 93 fL (ref 81–101)
MONO#: 0.9 10*3/uL (ref 0.1–0.9)
MONO%: 8.5 % (ref 0.0–13.0)
NEUT#: 3.3 10*3/uL (ref 1.5–6.5)
NEUT%: 31.1 % — AB (ref 39.6–80.0)
Platelets: 190 10*3/uL (ref 145–400)
RBC: 4.32 10*6/uL (ref 3.70–5.32)
RDW: 12.8 % (ref 11.1–15.7)
WBC: 10.5 10*3/uL — ABNORMAL HIGH (ref 3.9–10.0)

## 2015-05-23 LAB — COMPREHENSIVE METABOLIC PANEL
ALBUMIN: 3.8 g/dL (ref 3.5–5.0)
ALK PHOS: 79 U/L (ref 40–150)
ALT: 9 U/L (ref 0–55)
ANION GAP: 8 meq/L (ref 3–11)
AST: 20 U/L (ref 5–34)
BILIRUBIN TOTAL: 0.56 mg/dL (ref 0.20–1.20)
BUN: 16 mg/dL (ref 7.0–26.0)
CALCIUM: 9.1 mg/dL (ref 8.4–10.4)
CO2: 25 mEq/L (ref 22–29)
Chloride: 106 mEq/L (ref 98–109)
Creatinine: 1.1 mg/dL (ref 0.6–1.1)
EGFR: 45 mL/min/{1.73_m2} — AB (ref >90)
GLUCOSE: 113 mg/dL (ref 70–140)
POTASSIUM: 4 meq/L (ref 3.5–5.1)
SODIUM: 140 meq/L (ref 136–145)
Total Protein: 6.5 g/dL (ref 6.4–8.3)

## 2015-05-23 LAB — CHCC SATELLITE - SMEAR

## 2015-05-23 NOTE — Progress Notes (Signed)
Hematology and Oncology Follow Up Visit  Krystal [name] [id number] [birth year] 82 y.o. 05/23/2015   Principle Diagnosis:   Stage A CLL  Current Therapy:    Observation     Interim History:  Krystal [name] is comes in for follow-up. We last saw her back in August. Since then, everything the doing fairly well. She's had no problems since we last saw her. There really has not been any change in medications. Her graft she's had no fever. She's had no nausea or vomiting. She's had no cough or shortness of breath. She's had no headache. There's not any rashes. She's not noted any swollen lymph nodes.  She's had no change in bowel or bladder habits.  Overall, her performance status is ECOG 2.  Medications:  Current outpatient prescriptions:  .  amLODipine (NORVASC) 5 MG tablet, Take 1 tablet (5 mg total) by mouth daily., Disp: 90 tablet, Rfl: 1 .  aspirin EC 81 MG tablet, Take 1 tablet (81 mg total) by mouth daily., Disp: 30 tablet, Rfl: 3 .  Cholecalciferol (VITAMIN D) 2000 UNITS CAPS, Take 1 capsule (2,000 Units total) by mouth daily., Disp: 30 capsule, Rfl: 3 .  ibuprofen (ADVIL,MOTRIN) 200 MG tablet, Take 200 mg by mouth as needed., Disp: , Rfl:  .  latanoprost (XALATAN) 0.005 % ophthalmic solution, Place 1 drop into both eyes at bedtime., Disp: , Rfl:  .  Levothyroxine Sodium 25 MCG CAPS, Take 1 capsule (25 mcg total) by mouth daily before breakfast., Disp: 90 capsule, Rfl: 1 .  Multiple Vitamins-Minerals (SENIOR MULTIVITAMIN PLUS PO), Take by mouth every morning., Disp: , Rfl:  .  olmesartan (BENICAR) 20 MG tablet, Take 1 tablet (20 mg total) by mouth daily., Disp: 90 tablet, Rfl: 1 .  Omega-3 Fatty Acids (FISH OIL) 1000 MG CAPS, Take 1,000 capsules by mouth 2 (two) times daily., Disp: , Rfl:  .  Polyethyl Glycol-Propyl Glycol (SYSTANE OP), Apply to eye., Disp: , Rfl:  .  simvastatin (ZOCOR) 20 MG tablet, TAKE 1 TABLET (20 MG TOTAL) BY MOUTH DAILY., Disp: 90 tablet, Rfl:  1  Allergies: No Known Allergies  Past Medical History, Surgical history, Social history, and Family History were reviewed and updated.  Review of Systems: As above  Physical Exam:  height is 4\' 9"  (1.448 m) and weight is 162 lb (73.483 kg). Her oral temperature is 98.1 F (36.7 C). Her blood pressure is 151/63 and her pulse is 81. Her respiration is 16.   Elderly, petite white female in no obvious distress. Head and neck exam shows no ocular or oral lesions. There are no palpable cervical or supraclavicular lymph nodes. Lungs are clear. Cardiac exam regular rate and rhythm with no murmurs, rubs or bruits. Axillary exam shows no bilateral axillary adenopathy. Abdomen is soft. She has good bowel sound per there is no fluid wave. There is no palpable liver or spleen tip. Back exam shows some slight kyphosis. No tenderness is noted over the spine, ribs or hips. Externally shows no clubbing, cyanosis or edema. She has good range of motion of her joints. She has good strength. Skin exam no rashes, ecchymoses or petechia. Neurological exam shows no focal neurological deficits.  Lab Results  Component Value Date   WBC 10.5* 05/23/2015   HGB 13.7 05/23/2015   HCT 40.1 05/23/2015   MCV 93 05/23/2015   PLT 190 05/23/2015     Chemistry      Component Value Date/Time   NA 142 01/29/2015 1029  NA 140 11/22/2014 1012   NA 142 05/24/2014 1124   K 4.3 01/29/2015 1029   K 4.3 11/22/2014 1012   K 4.2 05/24/2014 1124   CL 103 01/29/2015 1029   CL 102 05/24/2014 1124   CO2 24 01/29/2015 1029   CO2 26 11/22/2014 1012   CO2 26 05/24/2014 1124   BUN 12 01/29/2015 1029   BUN 12.6 11/22/2014 1012   BUN 19 05/24/2014 1124   CREATININE 0.94 01/29/2015 1029   CREATININE 0.9 11/22/2014 1012   CREATININE 0.9 05/24/2014 1124      Component Value Date/Time   CALCIUM 9.0 01/29/2015 1029   CALCIUM 9.0 11/22/2014 1012   CALCIUM 9.4 05/24/2014 1124   ALKPHOS 67 01/29/2015 1029   ALKPHOS 73 11/22/2014  1012   ALKPHOS 84 05/24/2014 1124   AST 23 01/29/2015 1029   AST 20 11/22/2014 1012   AST 19 05/24/2014 1124   ALT 14 01/29/2015 1029   ALT 9 11/22/2014 1012   ALT 9* 05/24/2014 1124   BILITOT 0.7 01/29/2015 1029   BILITOT 0.81 11/22/2014 1012   BILITOT 0.80 05/24/2014 1124   BILITOT 0.8 04/24/2014 1131         Impression and Plan: Krystal [name] is 82 year old white female with stage A CLL. Her white cell count really has not changed in 6 months.  I think we are probably get her back in one year.   If there are any problems within the year, she can always come back to see [place].   I do not see a need for any additional blood work. She does not need a bone marrow test. There is no indication for any scans.   [name], MD 3/1/201712:55 PM

## 2015-06-22 ENCOUNTER — Other Ambulatory Visit: Payer: Self-pay | Admitting: Internal Medicine

## 2015-06-22 DIAGNOSIS — H401123 Primary open-angle glaucoma, left eye, severe stage: Secondary | ICD-10-CM | POA: Diagnosis not present

## 2015-06-22 DIAGNOSIS — H1859 Other hereditary corneal dystrophies: Secondary | ICD-10-CM | POA: Diagnosis not present

## 2015-06-22 DIAGNOSIS — H401113 Primary open-angle glaucoma, right eye, severe stage: Secondary | ICD-10-CM | POA: Diagnosis not present

## 2015-06-22 DIAGNOSIS — H52203 Unspecified astigmatism, bilateral: Secondary | ICD-10-CM | POA: Diagnosis not present

## 2015-06-22 MED FILL — AMLODIPINE BESYLATE 5 MG TA: 5 | 90 days supply | Qty: 90 | Fill #0

## 2015-06-22 MED FILL — BENICAR 20 MG TABLET: 20 | 90 days supply | Qty: 90 | Fill #0

## 2015-06-22 MED FILL — LEVOTHYROXINE 25 MCG TABLET: 25 | 90 days supply | Qty: 90 | Fill #0

## 2015-07-04 DIAGNOSIS — D1801 Hemangioma of skin and subcutaneous tissue: Secondary | ICD-10-CM | POA: Diagnosis not present

## 2015-07-04 DIAGNOSIS — L821 Other seborrheic keratosis: Secondary | ICD-10-CM | POA: Diagnosis not present

## 2015-07-04 DIAGNOSIS — D225 Melanocytic nevi of trunk: Secondary | ICD-10-CM | POA: Diagnosis not present

## 2015-07-04 DIAGNOSIS — Z85828 Personal history of other malignant neoplasm of skin: Secondary | ICD-10-CM | POA: Diagnosis not present

## 2015-07-04 MED FILL — LATANOPROST 0.005% EYE DRP: 0.005 | 75 days supply | Qty: 8 | Fill #4

## 2015-07-20 MED FILL — SIMVASTATIN 20 MG TABLET: 20 | 90 days supply | Qty: 90 | Fill #1

## 2015-08-02 ENCOUNTER — Other Ambulatory Visit: Payer: Medicare Other

## 2015-08-06 ENCOUNTER — Encounter: Payer: Medicare Other | Admitting: Internal Medicine

## 2015-09-05 ENCOUNTER — Other Ambulatory Visit: Payer: Medicare Other

## 2015-09-05 DIAGNOSIS — E039 Hypothyroidism, unspecified: Secondary | ICD-10-CM | POA: Diagnosis not present

## 2015-09-06 ENCOUNTER — Encounter: Payer: Self-pay | Admitting: *Deleted

## 2015-09-06 LAB — TSH: TSH: 3.5 u[IU]/mL (ref 0.450–4.500)

## 2015-09-10 ENCOUNTER — Encounter: Payer: Self-pay | Admitting: Internal Medicine

## 2015-09-10 ENCOUNTER — Ambulatory Visit (INDEPENDENT_AMBULATORY_CARE_PROVIDER_SITE_OTHER): Payer: Medicare Other | Admitting: Internal Medicine

## 2015-09-10 VITALS — BP 132/72 | HR 74 | Temp 97.7°F | Ht <= 58 in | Wt 158.0 lb

## 2015-09-10 DIAGNOSIS — R51 Headache: Secondary | ICD-10-CM | POA: Diagnosis not present

## 2015-09-10 DIAGNOSIS — Z0000 Encounter for general adult medical examination without abnormal findings: Secondary | ICD-10-CM

## 2015-09-10 DIAGNOSIS — M436 Torticollis: Secondary | ICD-10-CM | POA: Diagnosis not present

## 2015-09-10 DIAGNOSIS — C9110 Chronic lymphocytic leukemia of B-cell type not having achieved remission: Secondary | ICD-10-CM

## 2015-09-10 DIAGNOSIS — R739 Hyperglycemia, unspecified: Secondary | ICD-10-CM

## 2015-09-10 DIAGNOSIS — E785 Hyperlipidemia, unspecified: Secondary | ICD-10-CM | POA: Diagnosis not present

## 2015-09-10 DIAGNOSIS — E039 Hypothyroidism, unspecified: Secondary | ICD-10-CM

## 2015-09-10 DIAGNOSIS — M79676 Pain in unspecified toe(s): Secondary | ICD-10-CM | POA: Insufficient documentation

## 2015-09-10 DIAGNOSIS — R519 Headache, unspecified: Secondary | ICD-10-CM

## 2015-09-10 DIAGNOSIS — M79675 Pain in left toe(s): Secondary | ICD-10-CM | POA: Diagnosis not present

## 2015-09-10 NOTE — Progress Notes (Signed)
Patient ID: Krystal [name], female   DOB: October 18, 1925, 82 y.o.   MRN: [id number] Failed clock   Location:  [facility] clinic Provider: Zianne Schubring L. Mariea Clonts, D.O., C.M.D.  Patient Care Team: Gayland Curry, DO as PCP - General (Geriatric Medicine) [name], MD as Consulting Physician (Oncology) [name], MD as Consulting Physician (Ophthalmology)  Extended Emergency Contact Information Primary Emergency Contact: [place] Address: [place]          [place], [place] [address] [address]nette Litter of [place] Phone: [phone] Mobile Phone: [phone] Relation: Daughter Secondary Emergency Contact: Lucianne Lei [place] of [place] Phone: [phone] Relation: None  Code Status: DNR Goals of Care: Advanced Directive information Advanced Directives 09/10/2015  Does patient have an advance directive? Yes  Type of Paramedic of [place];Living will  Copy of advanced directive(s) in chart? Yes  Would patient like information on creating an advanced directive? -     Chief Complaint  Patient presents with  . Annual Exam    wellness exam  . MMSE    29/30 failed clock    HPI: Patient is a 82 y.o. female seen in today for an annual wellness exam.    Left great toe bothersome.  Hurts every once in a while.  Has to be careful not to knock against it.    Can't turn her neck.  Is sore on the posterior right side.  Started 2-3 mos ago.  At first she couldn't turn her head at all due to pain.  Still stiff though.  Hasn't taken anything for it, put anything on it.  Has crepitus when she moves her neck.   CLL:  Dr. Marin Olp said all was fine and f/u in a year (in March).  WBC had been stable x 6 mos.  Gets some pressure in the back of her head also.  Happens when she goes to stand up.  Not lightheaded. Not every time she gets up, it comes and goes.  Not in her forehead.    Depression screen [facility] 2/9 09/10/2015 07/31/2014 04/24/2014  Decreased Interest 0 0  0  Down, Depressed, Hopeless 0 0 0  PHQ - 2 Score 0 0 0    Fall Risk  09/10/2015 05/23/2015 11/22/2014 07/31/2014 05/24/2014  Falls in the past year? Yes No No No No  Injury with Fall? No - - - -  Risk for fall due to : - - - Impaired balance/gait;Impaired mobility -   MMSE - Mini Mental State Exam 09/10/2015 07/31/2014  Orientation to time 5 4  Orientation to Place 4 4  Registration 3 3  Attention/ Calculation 5 5  Recall 3 1  Language- name 2 objects 2 2  Language- repeat 1 1  Language- follow 3 step command 3 3  Language- read & follow direction 1 1  Write a sentence 1 1  Copy design 1 0  Total score 29 25  failed clock  Health Maintenance  Topic Date Due  . ZOSTAVAX  10/05/1987  . INFLUENZA VACCINE  10/23/2015  . TETANUS/TDAP  03/24/2022  . DEXA SCAN  Completed  . PNA vac Low Risk Adult  Completed    Urinary incontinence? No difficulty with making it to the restroom. Functional Status Survey: Is the patient deaf or have difficulty hearing?: No Does the patient have difficulty seeing, even when wearing glasses/contacts?: Yes (follows with Dr. Satira Sark 3/31) Does the patient have difficulty concentrating, remembering, or making decisions?: No (some mild difficulty coming up  with what she wants to say) Does the patient have difficulty walking or climbing stairs?: Yes (must use cane when walking in grass or outside b/c she "weaves around", no falls) Does the patient have difficulty dressing or bathing?: No Does the patient have difficulty doing errands alone such as visiting a doctor's office or shopping?: No (she still drives) Exercise?Current Exercise Habits: The patient does not participate in regular exercise at present Exercise limited by: None identified (just doesn't like to ) Diet? No special diet Vision Screening Comments: Dr, Rance Muir 05/2015 Hearing: no problem Dentition:  Just had a filling.  Sees dentist regularly. Pain:  Left great toe, neck, pressure in head.     Went on road trip to [place] and she had no complaints.    Past Medical History  Diagnosis Date  . Hypertension   . Hyperlipidemia   . Thyroid disease   . Arthritis   . Osteoporosis   . Glaucoma     Past Surgical History  Procedure Laterality Date  . Abdominal hysterectomy  1965    partial  . Cholecystectomy  1989  . Skin surgery  02/2014    [place] Cell removed from right side of face    Social History   Social History  . Marital Status: Widowed    Spouse Name: N/A  . Number of Children: N/A  . Years of Education: N/A   Occupational History  . Not on file.   Social History Main Topics  . Smoking status: Never Smoker   . Smokeless tobacco: Never Used     Comment: never used tobacco  . Alcohol Use: No  . Drug Use: No  . Sexual Activity: Not on file   Other Topics Concern  . Not on file   Social History Narrative   Caffeine: Coffee   Widow, married in 1947   Lives in house, 1 stories, one person, one dog   Current/past profession: Network engineer   Exercise: No   Living Will: Yes   DNR: No, would not like to discuss               No Known Allergies    Medication List       This list is accurate as of: 09/10/15  2:37 PM.  Always use your most recent med list.               amLODipine 5 MG tablet  Commonly known as:  NORVASC  TAKE 1 TABLET (5 MG TOTAL) BY MOUTH DAILY.     aspirin EC 81 MG tablet  Take 1 tablet (81 mg total) by mouth daily.     BENICAR 20 MG tablet  Generic drug:  olmesartan  TAKE 1 TABLET (20 MG TOTAL) BY MOUTH DAILY.     Fish Oil 1000 MG Caps  Take 1,000 capsules by mouth 2 (two) times daily.     ibuprofen 200 MG tablet  Commonly known as:  ADVIL,MOTRIN  Take 200 mg by mouth as needed.     latanoprost 0.005 % ophthalmic solution  Commonly known as:  XALATAN  Place 1 drop into both eyes at bedtime.     levothyroxine 25 MCG tablet  Commonly known as:  SYNTHROID, LEVOTHROID  TAKE 1 TABLET (25 MCG TOTAL) BY MOUTH DAILY  BEFORE BREAKFAST.     SENIOR MULTIVITAMIN PLUS PO  Take by mouth every morning.     simvastatin 20 MG tablet  Commonly known as:  ZOCOR  TAKE 1 TABLET (20 MG  TOTAL) BY MOUTH DAILY.     SYSTANE OP  Apply to eye.     Vitamin D 2000 units Caps  Take 1 capsule (2,000 Units total) by mouth daily.         Review of Systems:  Review of Systems  Constitutional: Negative for fever, chills and malaise/fatigue.  HENT: Negative for congestion and hearing loss.        Pressure in back of head; no sinus congestion or frontal headache  Eyes: Negative for blurred vision, photophobia and pain.       Has glaucoma on drops  Respiratory: Negative for cough and shortness of breath.   Cardiovascular: Positive for leg swelling. Negative for chest pain and palpitations.  Gastrointestinal: Negative for abdominal pain, constipation, blood in stool and melena.  Genitourinary: Negative for dysuria, urgency and frequency.  Musculoskeletal: Positive for myalgias, joint pain and neck pain. Negative for falls.  Skin: Negative for rash.  Neurological: Negative for dizziness, sensory change, focal weakness, loss of consciousness and weakness.  Psychiatric/Behavioral: Positive for memory loss. Negative for depression.    Physical Exam: Filed Vitals:   09/10/15 1350  BP: 132/72  Pulse: 74  Temp: 97.7 F (36.5 C)  TempSrc: Oral  Height: 4\' 9"  (1.448 m)  Weight: 158 lb (71.668 kg)  SpO2: 96%   Body mass index is 34.18 kg/(m^2). Physical Exam  Constitutional: She is oriented to person, place, and time. She appears well-developed and well-nourished. No distress.  HENT:  Head: Normocephalic and atraumatic.  Right Ear: External ear normal.  Left Ear: External ear normal.  Nose: Nose normal.  Mouth/Throat: Oropharynx is clear and moist. No oropharyngeal exudate.  Eyes: Conjunctivae and EOM are normal. Pupils are equal, round, and reactive to light.  Neck: Neck supple. No JVD present.  Cardiovascular:  Normal rate, regular rhythm, normal heart sounds and intact distal pulses.   Pulmonary/Chest: Effort normal and breath sounds normal. No respiratory distress. Right breast exhibits no inverted nipple, no mass, no nipple discharge, no skin change and no tenderness. Left breast exhibits no inverted nipple, no mass, no nipple discharge, no skin change and no tenderness.  Abdominal: Soft. Bowel sounds are normal. She exhibits no distension and no mass. There is no tenderness.  Musculoskeletal:  No tenderness, erythema or warmth of left great toe; poor ROM right shoulder--unable to reach the nape of her neck to comb her hair with her right hand; right side of neck in paravertebral muscles tender down into her trapezius muscle; decreased rotation and sidebending right  Lymphadenopathy:    She has no cervical adenopathy.       Right cervical: No superficial cervical adenopathy present.      Left cervical: No superficial cervical adenopathy present.    She has no axillary adenopathy.       Right: No supraclavicular adenopathy present.       Left: No supraclavicular adenopathy present.  Neurological: She is alert and oriented to person, place, and time. She displays abnormal reflex. No cranial nerve deficit.  Skin: Skin is warm and dry.  Psychiatric: She has a normal mood and affect. Her behavior is normal.    Labs reviewed: Basic Metabolic Panel:  Recent Labs  11/22/14 1012 01/29/15 1029 05/23/15 1126 09/05/15 1027  NA 140 142 140  --   K 4.3 4.3 4.0  --   CL  --  103  --   --   CO2 26 24 25   --   GLUCOSE 96 93 113  --  BUN 12.6 12 16.0  --   CREATININE 0.9 0.94 1.1  --   CALCIUM 9.0 9.0 9.1  --   TSH  --  4.480  --  3.500   Liver Function Tests:  Recent Labs  11/22/14 1012 01/29/15 1029 05/23/15 1126  AST 20 23 20   ALT 9 14 9   ALKPHOS 73 67 79  BILITOT 0.81 0.7 0.56  PROT 6.2* 5.9* 6.5  ALBUMIN 3.7 4.0 3.8   No results for input(s): LIPASE, AMYLASE in the last 8760  hours. No results for input(s): AMMONIA in the last 8760 hours. CBC:  Recent Labs  11/22/14 0958 01/29/15 1029 05/23/15 1126  WBC 11.3* 10.6 10.5*  NEUTROABS 3.2 3.2 3.3  HGB 13.2  --  13.7  HCT 38.9 39.2 40.1  MCV 93 93 93  PLT 205 209 190   Lipid Panel:  Recent Labs  01/29/15 1029  CHOL 146  HDL 61  LDLCALC 71  TRIG 72  CHOLHDL 2.4   Lab Results  Component Value Date   HGBA1C 5.9* 04/24/2014   EKG:  NSR at 72 bpm, low limb voltage  Assessment/Plan 1. Medicare annual wellness visit, subsequent - see hpi - Lipid panel; Future - TSH; Future - CBC with Differential/Platelet; Future - Comprehensive metabolic panel; Future - Hemoglobin A1c; Future - Uric Acid; Future  2. Right torticollis - pain and difficulty turning and sidebending right -refused PT so advised to do ROM exercises 10 reps 3x per day  -also use heat or topical muscle rubs -if she changes her mind, can call back for PT referral -may use ibuprofen occasionally for the pain also  3. Chronic lymphocytic leukemia ([place]) - has been stable and does not see hematology again until next March - CBC with Differential/Platelet; Future  4. Hyperglycemia - f/u labs:   - Comprehensive metabolic panel; Future - Hemoglobin A1c; Future -encouraged exercise and healthy diet  5. Great toe pain, left - suspect this could be gout - did not want anything done today -may be OA also, but want to know if she has high uric acid so check before next visit - Uric Acid; Future  6. Pressure in head -cause not clear, but asked her to check her blood pressure when she has this symptom and given parameters of when to call in instructions  7. Hypothyroidism, unspecified hypothyroidism type -TSH this time normal, cont current synthroid - TSH; Future  8. Hyperlipidemia - not dieting or exercising, is taking her meds as prescribed, needs f/u labs (only got tsh b/c she was supposed to have followed up long ago, but did  not) - Lipid panel; Future - EKG 12-Lead  Labs/tests ordered:   Orders Placed This Encounter  Procedures  . Lipid panel    Standing Status: Future     Number of Occurrences:      Standing Expiration Date: 09/09/2016    Order Specific Question:  Has the patient fasted?    Answer:  Yes  . TSH    Standing Status: Future     Number of Occurrences:      Standing Expiration Date: 09/09/2016  . CBC with Differential/Platelet    Standing Status: Future     Number of Occurrences:      Standing Expiration Date: 09/09/2016  . Comprehensive metabolic panel    Standing Status: Future     Number of Occurrences:      Standing Expiration Date: 09/09/2016    Order Specific Question:  Has the patient  fasted?    Answer:  Yes  . Hemoglobin A1c    Standing Status: Future     Number of Occurrences:      Standing Expiration Date: 09/09/2016  . Uric Acid    Standing Status: Future     Number of Occurrences:      Standing Expiration Date: 09/09/2016  . EKG 12-Lead    Next appt:  6 mos for med mgt, labs before  Ruthanna Macchia L. Shannen Flansburg, D.O. [facility] [address], [place] [address] Cell Phone (Mon-Fri 8am-5pm):  [phone] On Call:  [phone] & follow prompts after 5pm & weekends Office Phone:  [phone] Office Fax:  [phone]

## 2015-09-10 NOTE — Patient Instructions (Signed)
Please do range of motion exercises for your neck 10 reps 3 times per day.  Then apply heat for 20 minutes.  If you get the pressure sensation in your head, check your blood pressure and let me know if it is less than 100/60 or more than 150/90.

## 2015-09-17 ENCOUNTER — Other Ambulatory Visit: Payer: Self-pay | Admitting: *Deleted

## 2015-09-17 MED ORDER — OLMESARTAN MEDOXOMIL 20 MG PO TABS: ORAL_TABLET | ORAL | Status: DC

## 2015-09-17 MED ORDER — LEVOTHYROXINE SODIUM 25 MCG PO TABS: ORAL_TABLET | ORAL | Status: DC

## 2015-09-17 MED ORDER — AMLODIPINE BESYLATE 5 MG PO TABS: ORAL_TABLET | ORAL | Status: DC

## 2015-09-17 MED ORDER — SIMVASTATIN 20 MG PO TABS: ORAL_TABLET | ORAL | Status: DC

## 2015-09-17 NOTE — Telephone Encounter (Signed)
[facility] 

## 2015-10-08 DIAGNOSIS — H401113 Primary open-angle glaucoma, right eye, severe stage: Secondary | ICD-10-CM | POA: Diagnosis not present

## 2015-10-08 DIAGNOSIS — H532 Diplopia: Secondary | ICD-10-CM | POA: Diagnosis not present

## 2016-01-11 DIAGNOSIS — Z23 Encounter for immunization: Secondary | ICD-10-CM | POA: Diagnosis not present

## 2016-01-28 DIAGNOSIS — H401123 Primary open-angle glaucoma, left eye, severe stage: Secondary | ICD-10-CM | POA: Diagnosis not present

## 2016-01-28 DIAGNOSIS — H401113 Primary open-angle glaucoma, right eye, severe stage: Secondary | ICD-10-CM | POA: Diagnosis not present

## 2016-01-28 DIAGNOSIS — H52203 Unspecified astigmatism, bilateral: Secondary | ICD-10-CM | POA: Diagnosis not present

## 2016-01-28 DIAGNOSIS — H43813 Vitreous degeneration, bilateral: Secondary | ICD-10-CM | POA: Diagnosis not present

## 2016-03-07 ENCOUNTER — Other Ambulatory Visit: Payer: Self-pay | Admitting: Internal Medicine

## 2016-03-07 DIAGNOSIS — E785 Hyperlipidemia, unspecified: Secondary | ICD-10-CM

## 2016-03-07 DIAGNOSIS — M79675 Pain in left toe(s): Secondary | ICD-10-CM

## 2016-03-07 DIAGNOSIS — E039 Hypothyroidism, unspecified: Secondary | ICD-10-CM

## 2016-03-07 DIAGNOSIS — R739 Hyperglycemia, unspecified: Secondary | ICD-10-CM

## 2016-03-07 DIAGNOSIS — C9110 Chronic lymphocytic leukemia of B-cell type not having achieved remission: Secondary | ICD-10-CM

## 2016-03-11 ENCOUNTER — Other Ambulatory Visit: Payer: Medicare Other

## 2016-03-11 DIAGNOSIS — R739 Hyperglycemia, unspecified: Secondary | ICD-10-CM | POA: Diagnosis not present

## 2016-03-11 DIAGNOSIS — E039 Hypothyroidism, unspecified: Secondary | ICD-10-CM | POA: Diagnosis not present

## 2016-03-11 DIAGNOSIS — M79675 Pain in left toe(s): Secondary | ICD-10-CM

## 2016-03-11 DIAGNOSIS — C9110 Chronic lymphocytic leukemia of B-cell type not having achieved remission: Secondary | ICD-10-CM

## 2016-03-11 DIAGNOSIS — E785 Hyperlipidemia, unspecified: Secondary | ICD-10-CM | POA: Diagnosis not present

## 2016-03-11 LAB — CBC WITH DIFFERENTIAL/PLATELET
Basophils Absolute: 0 cells/uL (ref 0–200)
Basophils Relative: 0 %
Eosinophils Absolute: 186 cells/uL (ref 15–500)
Eosinophils Relative: 2 %
HCT: 40.8 % (ref 35.0–45.0)
Hemoglobin: 13.3 g/dL (ref 11.7–15.5)
Lymphocytes Relative: 56 %
Lymphs Abs: 5208 cells/uL — ABNORMAL HIGH (ref 850–3900)
MCH: 31.2 pg (ref 27.0–33.0)
MCHC: 32.6 g/dL (ref 32.0–36.0)
MCV: 95.8 fL (ref 80.0–100.0)
MPV: 9.6 fL (ref 7.5–12.5)
Monocytes Absolute: 744 cells/uL (ref 200–950)
Monocytes Relative: 8 %
Neutro Abs: 3162 cells/uL (ref 1500–7800)
Neutrophils Relative %: 34 %
Platelets: 214 10*3/uL (ref 140–400)
RBC: 4.26 MIL/uL (ref 3.80–5.10)
RDW: 13.7 % (ref 11.0–15.0)
WBC: 9.3 10*3/uL (ref 3.8–10.8)

## 2016-03-11 LAB — TSH: TSH: 4.74 mIU/L — ABNORMAL HIGH

## 2016-03-11 LAB — HEMOGLOBIN A1C
Hgb A1c MFr Bld: 5.5 % (ref <5.7)
Mean Plasma Glucose: 111 mg/dL

## 2016-03-12 LAB — LIPID PANEL
Cholesterol: 138 mg/dL (ref <200)
HDL: 66 mg/dL (ref >50)
LDL Cholesterol: 60 mg/dL (ref <100)
Total CHOL/HDL Ratio: 2.1 Ratio (ref <5.0)
Triglycerides: 59 mg/dL (ref <150)
VLDL: 12 mg/dL (ref <30)

## 2016-03-12 LAB — COMPLETE METABOLIC PANEL WITH GFR
ALT: 10 U/L (ref 6–29)
AST: 21 U/L (ref 10–35)
Albumin: 3.8 g/dL (ref 3.6–5.1)
Alkaline Phosphatase: 62 U/L (ref 33–130)
BUN: 14 mg/dL (ref 7–25)
CO2: 24 mmol/L (ref 20–31)
Calcium: 9.3 mg/dL (ref 8.6–10.4)
Chloride: 107 mmol/L (ref 98–110)
Creat: 1.03 mg/dL — ABNORMAL HIGH (ref 0.60–0.88)
GFR, Est African American: 56 mL/min — ABNORMAL LOW (ref >=60)
GFR, Est Non African American: 49 mL/min — ABNORMAL LOW (ref >=60)
Glucose, Bld: 117 mg/dL — ABNORMAL HIGH (ref 65–99)
Potassium: 4.3 mmol/L (ref 3.5–5.3)
Sodium: 142 mmol/L (ref 135–146)
Total Bilirubin: 0.8 mg/dL (ref 0.2–1.2)
Total Protein: 5.7 g/dL — ABNORMAL LOW (ref 6.1–8.1)

## 2016-03-12 LAB — URIC ACID: Uric Acid, Serum: 5.5 mg/dL (ref 2.5–7.0)

## 2016-03-13 ENCOUNTER — Ambulatory Visit (INDEPENDENT_AMBULATORY_CARE_PROVIDER_SITE_OTHER): Payer: Medicare Other | Admitting: Internal Medicine

## 2016-03-13 ENCOUNTER — Encounter: Payer: Self-pay | Admitting: Internal Medicine

## 2016-03-13 VITALS — BP 138/70 | HR 78 | Temp 97.4°F | Ht <= 58 in | Wt 158.0 lb

## 2016-03-13 DIAGNOSIS — R739 Hyperglycemia, unspecified: Secondary | ICD-10-CM

## 2016-03-13 DIAGNOSIS — H903 Sensorineural hearing loss, bilateral: Secondary | ICD-10-CM

## 2016-03-13 DIAGNOSIS — G5692 Unspecified mononeuropathy of left upper limb: Secondary | ICD-10-CM

## 2016-03-13 DIAGNOSIS — M436 Torticollis: Secondary | ICD-10-CM

## 2016-03-13 DIAGNOSIS — I10 Essential (primary) hypertension: Secondary | ICD-10-CM | POA: Diagnosis not present

## 2016-03-13 DIAGNOSIS — E039 Hypothyroidism, unspecified: Secondary | ICD-10-CM | POA: Diagnosis not present

## 2016-03-13 DIAGNOSIS — C9110 Chronic lymphocytic leukemia of B-cell type not having achieved remission: Secondary | ICD-10-CM

## 2016-03-13 DIAGNOSIS — M792 Neuralgia and neuritis, unspecified: Secondary | ICD-10-CM

## 2016-03-13 DIAGNOSIS — G3184 Mild cognitive impairment, so stated: Secondary | ICD-10-CM

## 2016-03-13 DIAGNOSIS — E7800 Pure hypercholesterolemia, unspecified: Secondary | ICD-10-CM

## 2016-03-13 NOTE — Progress Notes (Signed)
Location:  [facility] clinic Provider:  Sybilla Malhotra L. Mariea Clonts, D.O., C.M.D.  Code Status: DNR Goals of Care:  Advanced Directives 03/13/2016  Does Patient Have a Medical Advance Directive? Yes  Type of Advance Directive Living will;Healthcare Power of Attorney  Does patient want to make changes to medical advance directive? -  Copy of [place] in Chart? Yes  Would patient like information on creating a medical advance directive? -  Pre-existing out of facility DNR order (yellow form or pink MOST form) -   Chief Complaint  Patient presents with  . Medical Management of Chronic Issues    60mth follow-up    HPI: Patient is a 82 y.o. female seen today for medical management of chronic diseases.  She is doing just fine.    Torticollis pretty well gone.  Neck not bothering her so much now.  Left hand will hurt a lot, just in the fingers--comes on suddenly and goes away. Not positional.    TSH was elevated.  Plan to recheck in 6 wks.  She has not felt any different.    No other pains.    Has a little trouble sleeping at times--may take a while to go to sleep, sleeps a couple hrs, then wakes up and cycle repeats.  Does nap in the daytime by accident when watching TV.  Does not get in gear until 11am.  She also tries to stay up later at night.    Sugar average has normalized.  Admits to eating too many sweets.   Does not do much exercise and never really did.    BP was fine.  No dizziness or headaches.  Cholesterol panel wnl with her statin.    Her daughter thinks she needs her hearing tested, but she refuses.    Past Medical History:  Diagnosis Date  . Arthritis   . Glaucoma   . Hyperlipidemia   . Hypertension   . Osteoporosis   . Thyroid disease     Past Surgical History:  Procedure Laterality Date  . ABDOMINAL HYSTERECTOMY  1965   partial  . CHOLECYSTECTOMY  1989  . SKIN SURGERY  02/2014   Bas[place] Cell removed from right side of face     No Known  Allergies  Allergies as of 03/13/2016   No Known Allergies     Medication List       Accurate as of 03/13/16  1:21 PM. Always use your most recent med list.          amLODipine 5 MG tablet Commonly known as:  NORVASC Take one tablet by mouth once daily   aspirin EC 81 MG tablet Take 1 tablet (81 mg total) by mouth daily.   Fish Oil 1000 MG Caps Take 1,000 capsules by mouth 2 (two) times daily.   ibuprofen 200 MG tablet Commonly known as:  ADVIL,MOTRIN Take 200 mg by mouth as needed.   latanoprost 0.005 % ophthalmic solution Commonly known as:  XALATAN Place 1 drop into both eyes at bedtime.   levothyroxine 25 MCG tablet Commonly known as:  SYNTHROID, LEVOTHROID Take one tablet by mouth once daily before breakfast for thyroid   olmesartan 20 MG tablet Commonly known as:  BENICAR Take one tablet by mouth once daily   SENIOR MULTIVITAMIN PLUS PO Take by mouth every morning.   simvastatin 20 MG tablet Commonly known as:  ZOCOR Take one tablet by mouth once daily for cholesterol   SYSTANE OP Apply to eye.  Vitamin D 2000 units Caps Take 1 capsule (2,000 Units total) by mouth daily.       Review of Systems:  Review of Systems  Constitutional: Negative for chills, fever and malaise/fatigue.  HENT: Positive for hearing loss. Negative for congestion.   Eyes: Negative for blurred vision.       Glasses  Respiratory: Negative for shortness of breath.   Cardiovascular: Negative for chest pain, palpitations and leg swelling.  Gastrointestinal: Negative for abdominal pain, blood in stool, constipation and melena.  Genitourinary: Negative for dysuria.  Musculoskeletal: Negative for falls.  Skin: Negative for rash.  Neurological: Negative for dizziness, loss of consciousness and weakness.  Endo/Heme/Allergies: Bruises/bleeds easily.       CML  Psychiatric/Behavioral: Positive for memory loss. Negative for depression. The patient has insomnia.        Mild  cognitive impairment    Health Maintenance  Topic Date Due  . ZOSTAVAX  10/05/1987  . TETANUS/TDAP  03/24/2022  . INFLUENZA VACCINE  Completed  . DEXA SCAN  Completed  . PNA vac Low Risk Adult  Completed    Physical Exam: Vitals:   03/13/16 1314  BP: 138/70  Pulse: 78  Temp: 97.4 F (36.3 C)  TempSrc: Oral  SpO2: 96%  Weight: 158 lb (71.7 kg)  Height: 4\' 9"  (1.448 m)   Body mass index is 34.19 kg/m. Physical Exam  Constitutional: She is oriented to person, place, and time. She appears well-developed and well-nourished. No distress.  Cardiovascular: Normal rate, regular rhythm, normal heart sounds and intact distal pulses.   Left hand pulses intact and warm, no numbness during exam  Pulmonary/Chest: Effort normal and breath sounds normal. No respiratory distress.  Abdominal: Soft. Bowel sounds are normal.  Musculoskeletal: Normal range of motion. She exhibits no tenderness.  Carries cane  Neurological: She is alert and oriented to person, place, and time. No cranial nerve deficit.  Skin: Skin is warm and dry. Capillary refill takes less than 2 seconds.  Psychiatric: She has a normal mood and affect.    Labs reviewed: Basic Metabolic Panel:  Recent Labs  05/23/15 1126 09/05/15 1027 03/11/16 0836  NA 140  --  142  K 4.0  --  4.3  CL  --   --  107  CO2 25  --  24  GLUCOSE 113  --  117*  BUN 16.0  --  14  CREATININE 1.1  --  1.03*  CALCIUM 9.1  --  9.3  TSH  --  3.500 4.74*   Liver Function Tests:  Recent Labs  05/23/15 1126 03/11/16 0836  AST 20 21  ALT 9 10  ALKPHOS 79 62  BILITOT 0.56 0.8  PROT 6.5 5.7*  ALBUMIN 3.8 3.8   No results for input(s): LIPASE, AMYLASE in the last 8760 hours. No results for input(s): AMMONIA in the last 8760 hours. CBC:  Recent Labs  05/23/15 1126 03/11/16 0836  WBC 10.5* 9.3  NEUTROABS 3.3 3,162  HGB 13.7 13.3  HCT 40.1 40.8  MCV 93 95.8  PLT 190 214   Lipid Panel:  Recent Labs  03/11/16 0836  CHOL 138   HDL 66  LDLCALC 60  TRIG 59  CHOLHDL 2.1   Lab Results  Component Value Date   HGBA1C 5.5 03/11/2016    Assessment/Plan 1. Hyperglycemia -glucose average wnl -cont dietary changes  2. Chronic lymphocytic leukemia ([place]) -has been stable, monitor cbc, sees Dr. Marin Olp annually  3. Right torticollis -improved, less pain lately,  monitor, would do PT if recurs  4. Pure hypercholesterolemia -lipids at goal, cont zocor, monitor  5. Essential hypertension, benign -bp at goal with current therapy so cont olmesartan and amlodipine as ordered  6. Mild cognitive impairment with memory loss -cont to monitor mmse, but does not seem to have any functional losses, mostly difficulty with some word-finding, name-finding  7. Neuropathic pain of left hand -suspect related to neck OA and head position, but pt has not noted a correlation with anything -is so short-lasting that meds not indicated  8. Acquired hypothyroidism - f/u labs in 6 wks: - TSH; Future - T4, Free; Future  9. Sensorineural hearing loss (SNHL) of both ears - Ambulatory referral to Audiology due to progressive hearing loss  Labs/tests ordered:   Orders Placed This Encounter  Procedures  . TSH    Standing Status:   Future    Standing Expiration Date:   09/11/2016  . T4, Free    Standing Status:   Future    Standing Expiration Date:   09/11/2016  . Ambulatory referral to Audiology    Referral Priority:   Routine    Referral Type:   Audiology Exam    Referral Reason:   Specialty Services Required    Number of Visits Requested:   1    Next appt:  3 wks tsh, free t4; see me in 6 mos for med mgt and Alisa, LPN for [place]. Shiva Karis, D.O. [facility] [address], [place] [address] Cell Phone (Mon-Fri 8am-5pm):  [phone] On Call:  [phone] & follow prompts after 5pm & weekends Office Phone:  [phone] Office Fax:  [phone]

## 2016-04-18 ENCOUNTER — Other Ambulatory Visit: Payer: Self-pay

## 2016-04-18 DIAGNOSIS — E039 Hypothyroidism, unspecified: Secondary | ICD-10-CM

## 2016-04-22 ENCOUNTER — Other Ambulatory Visit: Payer: Self-pay | Admitting: Internal Medicine

## 2016-04-23 DIAGNOSIS — H903 Sensorineural hearing loss, bilateral: Secondary | ICD-10-CM | POA: Diagnosis not present

## 2016-04-24 ENCOUNTER — Other Ambulatory Visit: Payer: Medicare Other

## 2016-04-24 DIAGNOSIS — E039 Hypothyroidism, unspecified: Secondary | ICD-10-CM

## 2016-04-24 LAB — T4, FREE: Free T4: 1.2 ng/dL (ref 0.8–1.8)

## 2016-04-24 LAB — TSH: TSH: 5.28 mIU/L — ABNORMAL HIGH

## 2016-04-25 ENCOUNTER — Encounter: Payer: Self-pay | Admitting: *Deleted

## 2016-05-22 ENCOUNTER — Other Ambulatory Visit: Payer: Medicare Other

## 2016-05-22 ENCOUNTER — Ambulatory Visit: Payer: Medicare Other | Admitting: Family

## 2016-05-26 ENCOUNTER — Other Ambulatory Visit (HOSPITAL_BASED_OUTPATIENT_CLINIC_OR_DEPARTMENT_OTHER): Payer: Medicare Other

## 2016-05-26 ENCOUNTER — Ambulatory Visit (HOSPITAL_BASED_OUTPATIENT_CLINIC_OR_DEPARTMENT_OTHER): Payer: Medicare Other | Admitting: Family

## 2016-05-26 VITALS — BP 124/61 | HR 74 | Temp 98.5°F | Resp 18 | Wt 157.4 lb

## 2016-05-26 DIAGNOSIS — C9110 Chronic lymphocytic leukemia of B-cell type not having achieved remission: Secondary | ICD-10-CM

## 2016-05-26 DIAGNOSIS — Z8579 Personal history of other malignant neoplasms of lymphoid, hematopoietic and related tissues: Secondary | ICD-10-CM

## 2016-05-26 LAB — CBC WITH DIFFERENTIAL (CANCER CENTER ONLY)
BASO#: 0 10*3/uL (ref 0.0–0.2)
BASO%: 0.2 % (ref 0.0–2.0)
EOS%: 2.3 % (ref 0.0–7.0)
Eosinophils Absolute: 0.2 10*3/uL (ref 0.0–0.5)
HCT: 39.7 % (ref 34.8–46.6)
HEMOGLOBIN: 13.4 g/dL (ref 11.6–15.9)
LYMPH#: 4.5 10*3/uL — AB (ref 0.9–3.3)
LYMPH%: 51.6 % — ABNORMAL HIGH (ref 14.0–48.0)
MCH: 32 pg (ref 26.0–34.0)
MCHC: 33.8 g/dL (ref 32.0–36.0)
MCV: 95 fL (ref 81–101)
MONO#: 1 10*3/uL — ABNORMAL HIGH (ref 0.1–0.9)
MONO%: 12 % (ref 0.0–13.0)
NEUT%: 33.9 % — AB (ref 39.6–80.0)
NEUTROS ABS: 3 10*3/uL (ref 1.5–6.5)
Platelets: 173 10*3/uL (ref 145–400)
RBC: 4.19 10*6/uL (ref 3.70–5.32)
RDW: 13.2 % (ref 11.1–15.7)
WBC: 8.7 10*3/uL (ref 3.9–10.0)

## 2016-05-26 LAB — CHCC SATELLITE - SMEAR

## 2016-05-26 NOTE — Patient Instructions (Signed)
Chronic Lymphocytic Leukemia Chronic lymphocytic leukemia (CLL) is a type of cancer of the blood cells and soft tissue inside bones (bone marrow). CLL happens when your bone marrow makes too many abnormal white blood cells. The cells, called leukemia cells, do not function normally and accumulate in the blood. Eventually they crowd out other healthy blood cells. CLL usually gets worse slowly. It can cause complications in your organs, such as in your spleen. It can also weaken your immune system and lead to conditions in which your immune system attacks your body (autoimmune conditions). What are the causes? The cause of this condition is not known. What increases the risk? You are more likely to develop this condition if:  You are older than 50 years.  You are white.  You are female.  You have a family history of CLL or other cancers of the lymph system.  You are of Russian Jewish or Eastern European Jewish descent.  You have been exposed to certain chemicals, such as: ? Insecticides. ? Herbicides. These include Agent Orange, a herbicide used in the [place] war.  What are the signs or symptoms? At first, there may be no symptoms. After a while, symptoms may include:  Feeling more tired than usual, even after rest.  Unplanned weight loss.  Heavy sweating at night.  Fever.  Shortness of breath.  Paleness.  Painless, swollen lymph nodes.  A feeling of fullness in the upper left part of the abdomen.  Easy bruising or bleeding.  Frequent infections.  How is this diagnosed? This condition is diagnosed based on:  A physical exam to check for an enlarged spleen, liver, or lymph nodes.  Blood and bone marrow tests to check for leukemia cells. Tests may include: ? A complete blood count. ? Flow cytometry. This method uses light sensors and dyes to figure out the number of cells as well as their size, structure, and general health. ? Immunophenotyping. This method is used to  diagnose leukemia by identifying specific antibodies found in white blood cells. The test is used when a complete blood count shows the presence of immature cells or a high number of white blood cells. ? Fluorescence in situ hybridization (FISH). This test is used to examine defects in chromosomes and how those defects affect the functioning of the cell. Results from a FISH test will be used to determine treatment and assess the outcome of that treatment.  A CT scan to check for swelling or anything abnormal in your spleen, liver, and lymph nodes.  How is this treated? Treatment for this condition depends on the stage of the leukemia and whether you have symptoms. Treatment may include:  Observation.  Targeted drugs. These are medicines that interfere with the way leukemia cells grow and multiply. They identify and attack specific leukemia cells without harming normal cells.  Chemotherapy drugs. These are medicines that kill leukemia cells that are multiplying quickly.  Radiation.  Surgery to remove the spleen.  Biological therapy (immunotherapy). This treatment boosts the ability of your immune system to fight the leukemia cells.  Bone marrow or peripheral blood stem cell transplant. This treatment replaces your own bone marrow or stem cells with bone marrow or stem cells from a donor. This treatment may be done after you receive very high doses of chemotherapy or radiation that kill your stem cells and bone marrow.  New treatments through clinical trials.  Additional medicines may be needed to help manage symptoms. Follow these instructions at home: Medicines  Take   over-the-counter and prescription medicines only as told by your health care provider.  If you were prescribed an antibiotic medicine, take it as told by your health care provider. Do not stop taking the antibiotic even if you start to feel better. If you are on chemotherapy:  Wash your hands often, especially before  meals, after being outside, and after using the toilet. Have visitors do the same.  Keep your teeth and gums clean and well cared for. Use soft toothbrushes.  Protect your skin from the sun by using sunscreen and wearing protective clothing. General instructions  Avoid contact sports or other rough activities. Ask your health care provider what activities are safe for you.  Avoid crowded places and people who are sick.  Tell your cancer care team if you develop side effects. They may be able to recommend ways to relieve them.  Try to eat regular, healthy meals. Some of your treatments might affect your appetite.  Find healthy ways of coping with stress, such as by doing yoga or meditation or by joining a support group.  Keep all follow-up visits as told by your health care provider. This is important. Where to find more information:  American Cancer Society: www.cancer.org  Leukemia and Lymphoma Society: www.lls.org  National Cancer Institute (NCI): www.cancer.gov Contact a health care provider if:  You have pain in your abdomen.  You develop new bruises that are getting bigger.  You have painful or more swollen lymph nodes.  You develop bleeding from your gums or nose.  You cannot eat or drink without vomiting.  You feel lightheaded. Get help right away if:  You have a fever or chills.  You develop chest pain.  You have trouble breathing or feel short of breath.  You faint.  There is blood in your urine or stool.  You have excessive bleeding.  You have any symptoms that are severe or uncontrolled. Summary  Chronic lymphocytic leukemia (CLL) is a type of cancer of the blood cells and bone marrow.  This condition can cause an enlarged spleen, swollen lymph nodes, a weakened immune system, low red blood cell and platelets counts, and autoimmune conditions.  Treatment for this condition depends on the stage of the cancer and whether you have  symptoms.  Chemotherapy, radiation, surgery to remove the spleen, and bone marrow transplant are some of the ways to treat CLL. This information is not intended to replace advice given to you by your health care provider. Make sure you discuss any questions you have with your health care provider. Document Released: 07/27/2008 Document Revised: 02/20/2016 Document Reviewed: 02/20/2016 Elsevier Interactive Patient Education  2017 Elsevier Inc.  

## 2016-05-26 NOTE — Progress Notes (Signed)
Hematology and Oncology Follow Up Visit  Krystal [name] [id number] 07/03/1925 83 y.o. 05/26/2016   Principle Diagnosis:  Stage A CLL  Current Therapy:   Observation    Interim History:  Krystal [name] is here today with her daughter for a follow-up. She continues to do fairly well. She is currently getting over a sinus infection. She has a dry cough but states that she is feeling better. She is letting it "run it's course." Her counts have remained stable so far. No anemia. WBC count is 8.7.  No fever, chills, n/v, cough, rash, dizziness, chest pain, palpitations, abdominal pain, changes in bowel or bladder habits. She has not noticed any blood in her urine or stool.  She has occasional SOB with over exertion and will take time to rest as needed.  No lymphadenopathy found on exam. No episodes of bleeding, bruising or petechiae.  No tenderness, numbness or tingling in her extremities. She has chronic swelling in her ankles (worse in the right leg) that does improve with elevation.  No falls or syncopal episodes. She uses a cane when ambulating as needed.  She has maintained a good appetite and is staying well hydrated. Her weight is stable.   Medications:  Allergies as of 05/26/2016   No Known Allergies     Medication List       Accurate as of 05/26/16 10:57 AM. Always use your most recent med list.          amLODipine 5 MG tablet Commonly known as:  NORVASC TAKE 1 TABLET BY MOUTH ONCE DAILY   aspirin EC 81 MG tablet Take 1 tablet (81 mg total) by mouth daily.   Fish Oil 1000 MG Caps Take 1,000 capsules by mouth 2 (two) times daily.   ibuprofen 200 MG tablet Commonly known as:  ADVIL,MOTRIN Take 200 mg by mouth as needed.   latanoprost 0.005 % ophthalmic solution Commonly known as:  XALATAN Place 1 drop into both eyes at bedtime.   levothyroxine 25 MCG tablet Commonly known as:  SYNTHROID, LEVOTHROID TAKE 1 TABLET BY MOUTH ONCE DAILY BEFORE BREAKFAST FOR  THYROID   olmesartan 20 MG tablet Commonly known as:  BENICAR TAKE 1 TABLET BY MOUTH ONCE DAILY   SENIOR MULTIVITAMIN PLUS PO Take by mouth every morning.   simvastatin 20 MG tablet Commonly known as:  ZOCOR TAKE 1 TABLET BY MOUTH ONCE DAILY FOR CHOLESTEROL   SYSTANE OP Apply to eye.   Vitamin D 2000 units Caps Take 1 capsule (2,000 Units total) by mouth daily.       Allergies: No Known Allergies  Past Medical History, Surgical history, Social history, and Family History were reviewed and updated.  Review of Systems: All other 10 point review of systems is negative.   Physical Exam:  vitals were not taken for this visit.  Wt Readings from Last 3 Encounters:  03/13/16 158 lb (71.7 kg)  09/10/15 158 lb (71.7 kg)  05/23/15 162 lb (73.5 kg)    Ocular: Sclerae unicteric, pupils equal, round and reactive to light Ear-nose-throat: Oropharynx clear, dentition fair Lymphatic: No cervical supraclavicular or axillary adenopathy Lungs clear throughout, no rales or rhonchi, good excursion bilaterally Heart regular rate and rhythm, no murmur appreciated Abd soft, nontender, positive bowel sounds, no liver or spleen tip palpated on exam, no fluid wave MSK no focal spinal tenderness, no joint edema Neuro: non-focal, well-oriented, appropriate affect Breasts: Deferred  Lab Results  Component Value Date   WBC 9.3 03/11/2016   HGB  13.3 03/11/2016   HCT 40.8 03/11/2016   MCV 95.8 03/11/2016   PLT 214 03/11/2016   No results found for: FERRITIN, IRON, TIBC, UIBC, IRONPCTSAT Lab Results  Component Value Date   RBC 4.26 03/11/2016   No results found for: Nils Pyle [facility] Lab Results  Component Value Date   IGGSERUM 1,090 05/24/2014   IGA 77 05/24/2014   IGMSERUM 64 05/24/2014   Lab Results  Component Value Date   TOTALPROTELP 6.2 05/24/2014   ALBUMINELP 62.1 05/24/2014   A1GS 4.7 05/24/2014   A2GS 11.7 05/24/2014   BETS 6.8 05/24/2014   BETA2SER 4.3  05/24/2014   GAMS 10.4 (L) 05/24/2014   MSPIKE NOT DET 05/24/2014   SPEI * 05/24/2014     Chemistry      Component Value Date/Time   NA 142 03/11/2016 0836   NA 140 05/23/2015 1126   K 4.3 03/11/2016 0836   K 4.0 05/23/2015 1126   CL 107 03/11/2016 0836   CL 102 05/24/2014 1124   CO2 24 03/11/2016 0836   CO2 25 05/23/2015 1126   BUN 14 03/11/2016 0836   BUN 16.0 05/23/2015 1126   CREATININE 1.03 (H) 03/11/2016 0836   CREATININE 1.1 05/23/2015 1126      Component Value Date/Time   CALCIUM 9.3 03/11/2016 0836   CALCIUM 9.1 05/23/2015 1126   ALKPHOS 62 03/11/2016 0836   ALKPHOS 79 05/23/2015 1126   AST 21 03/11/2016 0836   AST 20 05/23/2015 1126   ALT 10 03/11/2016 0836   ALT 9 05/23/2015 1126   BILITOT 0.8 03/11/2016 0836   BILITOT 0.56 05/23/2015 1126     Impression and Plan: Krystal [name] is a pleasant 83 yo white female with stage A CLL. She is doing well and so far, this has not been an issue for her. She is getting over a sinus infection and has an occasional dry cough. No other complaints at this time.  No anemia, WBC count is 8.7 with a platelet count of 173.  We will continue to follow along with her and plan to see her back in 1 year for repeat lab work and follow-up.  Both she and her daughter know to contact our office with any questions or concerns. We can certainly see her sooner if need be.   Eliezer Bottom, NP 3/5/201810:57 AM

## 2016-07-03 DIAGNOSIS — Z85828 Personal history of other malignant neoplasm of skin: Secondary | ICD-10-CM | POA: Diagnosis not present

## 2016-07-03 DIAGNOSIS — D1801 Hemangioma of skin and subcutaneous tissue: Secondary | ICD-10-CM | POA: Diagnosis not present

## 2016-07-03 DIAGNOSIS — L814 Other melanin hyperpigmentation: Secondary | ICD-10-CM | POA: Diagnosis not present

## 2016-07-03 DIAGNOSIS — L821 Other seborrheic keratosis: Secondary | ICD-10-CM | POA: Diagnosis not present

## 2016-07-23 DIAGNOSIS — H401134 Primary open-angle glaucoma, bilateral, indeterminate stage: Secondary | ICD-10-CM | POA: Diagnosis not present

## 2016-08-25 ENCOUNTER — Encounter: Payer: Self-pay | Admitting: Internal Medicine

## 2016-09-11 ENCOUNTER — Ambulatory Visit: Payer: Medicare Other

## 2016-09-11 ENCOUNTER — Ambulatory Visit: Payer: Medicare Other | Admitting: Internal Medicine

## 2016-09-26 ENCOUNTER — Ambulatory Visit: Payer: Medicare Other

## 2016-09-26 ENCOUNTER — Ambulatory Visit (INDEPENDENT_AMBULATORY_CARE_PROVIDER_SITE_OTHER): Payer: Medicare Other

## 2016-09-26 VITALS — BP 130/80 | HR 73 | Temp 97.9°F | Ht <= 58 in | Wt 154.0 lb

## 2016-09-26 DIAGNOSIS — Z0000 Encounter for general adult medical examination without abnormal findings: Secondary | ICD-10-CM | POA: Diagnosis not present

## 2016-09-26 NOTE — Patient Instructions (Signed)
Krystal [name] , Thank you for taking time to come for your Medicare Wellness Visit. I appreciate your ongoing commitment to your health goals. Please review the following plan we discussed and let me know if I can assist you in the future.   Screening recommendations/referrals: Colonoscopy up to date, pt over age 81 Mammogram up to date, pt over age 39 Bone Density up to date Recommended yearly ophthalmology/optometry visit for glaucoma screening and checkup Recommended yearly dental visit for hygiene and checkup  Vaccinations: Influenza vaccine up to date. Due 01/10/2017 Pneumococcal vaccine up to date Tdap vaccine up to date. Due 03/24/2022 Shingles vaccine due. If you decide you want this I will send over prescription to your pharmacy  Advanced directives: In Chart  Conditions/risks identified: None  Next appointment: Dr. Mariea Clonts 09/29/2016 @1  pm   Preventive Care 65 Years and Older, Female Preventive care refers to lifestyle choices and visits with your health care provider that can promote health and wellness. What does preventive care include?  A yearly physical exam. This is also called an annual well check.  Dental exams once or twice a year.  Routine eye exams. Ask your health care provider how often you should have your eyes checked.  Personal lifestyle choices, including:  Daily care of your teeth and gums.  Regular physical activity.  Eating a healthy diet.  Avoiding tobacco and drug use.  Limiting alcohol use.  Practicing safe sex.  Taking low-dose aspirin every day.  Taking vitamin and mineral supplements as recommended by your health care provider. What happens during an annual well check? The services and screenings done by your health care provider during your annual well check will depend on your age, overall health, lifestyle risk factors, and family history of disease. Counseling  Your health care provider may ask you questions about your:  Alcohol  use.  Tobacco use.  Drug use.  Emotional well-being.  Home and relationship well-being.  Sexual activity.  Eating habits.  History of falls.  Memory and ability to understand (cognition).  Work and work Statistician.  Reproductive health. Screening  You may have the following tests or measurements:  Height, weight, and BMI.  Blood pressure.  Lipid and cholesterol levels. These may be checked every 5 years, or more frequently if you are over 75 years old.  Skin check.  Lung cancer screening. You may have this screening every year starting at age 50 if you have a 30-pack-year history of smoking and currently smoke or have quit within the past 15 years.  Fecal occult blood test (FOBT) of the stool. You may have this test every year starting at age 32.  Flexible sigmoidoscopy or colonoscopy. You may have a sigmoidoscopy every 5 years or a colonoscopy every 10 years starting at age 50.  Hepatitis C blood test.  Hepatitis B blood test.  Sexually transmitted disease (STD) testing.  Diabetes screening. This is done by checking your blood sugar (glucose) after you have not eaten for a while (fasting). You may have this done every 1-3 years.  Bone density scan. This is done to screen for osteoporosis. You may have this done starting at age 66.  Mammogram. This may be done every 1-2 years. Talk to your health care provider about how often you should have regular mammograms. Talk with your health care provider about your test results, treatment options, and if necessary, the need for more tests. Vaccines  Your health care provider may recommend certain vaccines, such as:  Influenza  vaccine. This is recommended every year.  Tetanus, diphtheria, and acellular pertussis (Tdap, Td) vaccine. You may need a Td booster every 10 years.  Zoster vaccine. You may need this after age 16.  Pneumococcal 13-valent conjugate (PCV13) vaccine. One dose is recommended after age  36.  Pneumococcal polysaccharide (PPSV23) vaccine. One dose is recommended after age 46. Talk to your health care provider about which screenings and vaccines you need and how often you need them. This information is not intended to replace advice given to you by your health care provider. Make sure you discuss any questions you have with your health care provider. Document Released: 04/06/2015 Document Revised: 11/28/2015 Document Reviewed: 01/09/2015 Elsevier Interactive Patient Education  2017 [place] Prevention in the Home Falls can cause injuries. They can happen to people of all ages. There are many things you can do to make your home safe and to help prevent falls. What can I do on the outside of my home?  Regularly fix the edges of walkways and driveways and fix any cracks.  Remove anything that might make you trip as you walk through a door, such as a raised step or threshold.  Trim any bushes or trees on the path to your home.  Use bright outdoor lighting.  Clear any walking paths of anything that might make someone trip, such as rocks or tools.  Regularly check to see if handrails are loose or broken. Make sure that both sides of any steps have handrails.  Any raised decks and porches should have guardrails on the edges.  Have any leaves, snow, or ice cleared regularly.  Use sand or salt on walking paths during winter.  Clean up any spills in your garage right away. This includes oil or grease spills. What can I do in the bathroom?  Use night lights.  Install grab bars by the toilet and in the tub and shower. Do not use towel bars as grab bars.  Use non-skid mats or decals in the tub or shower.  If you need to sit down in the shower, use a plastic, non-slip stool.  Keep the floor dry. Clean up any water that spills on the floor as soon as it happens.  Remove soap buildup in the tub or shower regularly.  Attach bath mats securely with double-sided  non-slip rug tape.  Do not have throw rugs and other things on the floor that can make you trip. What can I do in the bedroom?  Use night lights.  Make sure that you have a light by your bed that is easy to reach.  Do not use any sheets or blankets that are too big for your bed. They should not hang down onto the floor.  Have a firm chair that has side arms. You can use this for support while you get dressed.  Do not have throw rugs and other things on the floor that can make you trip. What can I do in the kitchen?  Clean up any spills right away.  Avoid walking on wet floors.  Keep items that you use a lot in easy-to-reach places.  If you need to reach something above you, use a strong step stool that has a grab bar.  Keep electrical cords out of the way.  Do not use floor polish or wax that makes floors slippery. If you must use wax, use non-skid floor wax.  Do not have throw rugs and other things on the floor that can  make you trip. What can I do with my stairs?  Do not leave any items on the stairs.  Make sure that there are handrails on both sides of the stairs and use them. Fix handrails that are broken or loose. Make sure that handrails are as long as the stairways.  Check any carpeting to make sure that it is firmly attached to the stairs. Fix any carpet that is loose or worn.  Avoid having throw rugs at the top or bottom of the stairs. If you do have throw rugs, attach them to the floor with carpet tape.  Make sure that you have a light switch at the top of the stairs and the bottom of the stairs. If you do not have them, ask someone to add them for you. What else can I do to help prevent falls?  Wear shoes that:  Do not have high heels.  Have rubber bottoms.  Are comfortable and fit you well.  Are closed at the toe. Do not wear sandals.  If you use a stepladder:  Make sure that it is fully opened. Do not climb a closed stepladder.  Make sure that both  sides of the stepladder are locked into place.  Ask someone to hold it for you, if possible.  Clearly mark and make sure that you can see:  Any grab bars or handrails.  First and last steps.  Where the edge of each step is.  Use tools that help you move around (mobility aids) if they are needed. These include:  Canes.  Walkers.  Scooters.  Crutches.  Turn on the lights when you go into a dark area. Replace any light bulbs as soon as they burn out.  Set up your furniture so you have a clear path. Avoid moving your furniture around.  If any of your floors are uneven, fix them.  If there are any pets around you, be aware of where they are.  Review your medicines with your doctor. Some medicines can make you feel dizzy. This can increase your chance of falling. Ask your doctor what other things that you can do to help prevent falls. This information is not intended to replace advice given to you by your health care provider. Make sure you discuss any questions you have with your health care provider. Document Released: 01/04/2009 Document Revised: 08/16/2015 Document Reviewed: 04/14/2014 Elsevier Interactive Patient Education  2017 [employer].

## 2016-09-26 NOTE — Progress Notes (Signed)
Subjective:   Krystal [name] is a 83 y.o. female who presents for Medicare Annual (Subsequent) preventive examination. Previous AWV-6/19/2017z     Objective:     Vitals: BP 130/80 (BP Location: Left Arm, Patient Position: Sitting)   Pulse 73   Temp 97.9 F (36.6 C) (Oral)   Ht 4\' 9"  (1.448 m)   Wt 154 lb (69.9 kg)   SpO2 96%   BMI 33.33 kg/m   Body mass index is 33.33 kg/m.   Tobacco History  Smoking Status  . Never Smoker  Smokeless Tobacco  . Never Used    Comment: never used tobacco     Counseling given: Not Answered   Past Medical History:  Diagnosis Date  . Arthritis   . Glaucoma   . Hyperlipidemia   . Hypertension   . Osteoporosis   . Thyroid disease    Past Surgical History:  Procedure Laterality Date  . ABDOMINAL HYSTERECTOMY  1965   partial  . CHOLECYSTECTOMY  1989  . SKIN SURGERY  02/2014   Basel Cell removed from right side of face    Family History  Problem Relation Age of Onset  . Cancer Mother   . Cancer Father        lung  . Heart disease Father    History  Sexual Activity  . Sexual activity: Not on file    Outpatient Encounter Prescriptions as of 09/26/2016  Medication Sig  . amLODipine (NORVASC) 5 MG tablet TAKE 1 TABLET BY MOUTH ONCE DAILY  . Cholecalciferol (VITAMIN D) 2000 UNITS CAPS Take 1 capsule (2,000 Units total) by mouth daily.  Marland Kitchen ibuprofen (ADVIL,MOTRIN) 200 MG tablet Take 200 mg by mouth as needed.  . latanoprost (XALATAN) 0.005 % ophthalmic solution Place 1 drop into both eyes at bedtime.  Marland Kitchen levothyroxine (SYNTHROID, LEVOTHROID) 25 MCG tablet TAKE 1 TABLET BY MOUTH ONCE DAILY BEFORE BREAKFAST FOR  THYROID  . Multiple Vitamins-Minerals (SENIOR MULTIVITAMIN PLUS PO) Take by mouth every morning.  . olmesartan (BENICAR) 20 MG tablet TAKE 1 TABLET BY MOUTH ONCE DAILY  . Omega-3 Fatty Acids (FISH OIL) 1000 MG CAPS Take 1,000 capsules by mouth 2 (two) times daily.  Vladimir Faster Glycol-Propyl Glycol (SYSTANE OP) Apply to  eye.  . simvastatin (ZOCOR) 20 MG tablet TAKE 1 TABLET BY MOUTH ONCE DAILY FOR CHOLESTEROL  . [DISCONTINUED] aspirin EC 81 MG tablet Take 1 tablet (81 mg total) by mouth daily.   No facility-administered encounter medications on file as of 09/26/2016.     Activities of Daily Living In your present state of health, do you have any difficulty performing the following activities: 09/26/2016  Hearing? Y  Vision? Y  Difficulty concentrating or making decisions? Y  Walking or climbing stairs? N  Dressing or bathing? N  Doing errands, shopping? N  Preparing Food and eating ? N  Using the Toilet? N  In the past six months, have you accidently leaked urine? N  Do you have problems with loss of bowel control? N  Managing your Medications? N  Managing your Finances? N  Housekeeping or managing your Housekeeping? N  Some recent data might be hidden    Patient Care Team: Gayland Curry, DO as PCP - General (Geriatric Medicine) [name], MD as Consulting Physician (Oncology) [name], MD as Consulting Physician (Ophthalmology)    Assessment:     Exercise Activities and Dietary recommendations Current Exercise Habits: The patient does not participate in regular exercise at present, Exercise limited  by: None identified  Goals    . Eat more fruits and vegetables          Patient will start increasing fruits and vegetables and protein in diet.      Fall Risk Fall Risk  09/26/2016 05/26/2016 03/13/2016 09/10/2015 05/23/2015  Falls in the past year? No No No Yes No  Injury with Fall? - - - No -  Risk for fall due to : - - - - -   Depression Screen PHQ 2/9 Scores 09/26/2016 03/13/2016 09/10/2015 07/31/2014  PHQ - 2 Score 0 0 0 0     Cognitive Function MMSE - Mini Mental State Exam 09/26/2016 09/10/2015 07/31/2014  Orientation to time 5 5 4   Orientation to Place 4 4 4   Registration 3 3 3   Attention/ Calculation 5 5 5   Recall 2 3 1   Language- name 2 objects 2 2 2   Language- repeat 1  1 1   Language- follow 3 step command 3 3 3   Language- read & follow direction 1 1 1   Write a sentence 1 1 1   Copy design 1 1 0  Total score 28 29 25         Immunization History  Administered Date(s) Administered  . Influenza,inj,Quad PF,36+ Mos 02/02/2015  . Influenza-Unspecified 01/02/2014, 01/11/2016  . Pneumococcal Conjugate-13 04/24/2014  . Pneumococcal-Unspecified 03/15/2013  . Tdap 03/24/2012   Screening Tests Health Maintenance  Topic Date Due  . INFLUENZA VACCINE  10/22/2016  . TETANUS/TDAP  03/24/2022  . DEXA SCAN  Completed  . PNA vac Low Risk Adult  Completed      Plan:    I have personally reviewed and addressed the Medicare Annual Wellness questionnaire and have noted the following in the patient's chart:  A. Medical and social history B. Use of alcohol, tobacco or illicit drugs  C. Current medications and supplements D. Functional ability and status E.  Nutritional status F.  Physical activity G. Advance directives H. List of other physicians I.  Hospitalizations, surgeries, and ER visits in previous 12 months J.  [place] to include hearing, vision, cognitive, depression L. Referrals and appointments - none  In addition, I have reviewed and discussed with patient certain preventive protocols, quality metrics, and best practice recommendations. A written personalized care plan for preventive services as well as general preventive health recommendations were provided to patient.  See attached scanned questionnaire for additional information.   Signed,   Rich Reining, RN Nurse Health Advisor   Quick Notes   Health Maintenance: Shingles due, pt declined     Abnormal Screen: MMSE 28/30. Did not pass clock     Patient Concerns: Pressure in head over last couple years, makes her foggy     Nurse Concerns: None

## 2016-09-29 ENCOUNTER — Encounter: Payer: Self-pay | Admitting: Internal Medicine

## 2016-09-29 ENCOUNTER — Ambulatory Visit: Payer: Medicare Other

## 2016-09-29 ENCOUNTER — Ambulatory Visit (INDEPENDENT_AMBULATORY_CARE_PROVIDER_SITE_OTHER): Payer: Medicare Other | Admitting: Internal Medicine

## 2016-09-29 VITALS — BP 138/70 | HR 72 | Temp 97.5°F | Wt 156.0 lb

## 2016-09-29 DIAGNOSIS — E7800 Pure hypercholesterolemia, unspecified: Secondary | ICD-10-CM | POA: Diagnosis not present

## 2016-09-29 DIAGNOSIS — C9110 Chronic lymphocytic leukemia of B-cell type not having achieved remission: Secondary | ICD-10-CM

## 2016-09-29 DIAGNOSIS — E039 Hypothyroidism, unspecified: Secondary | ICD-10-CM

## 2016-09-29 DIAGNOSIS — R0609 Other forms of dyspnea: Secondary | ICD-10-CM | POA: Diagnosis not present

## 2016-09-29 DIAGNOSIS — I10 Essential (primary) hypertension: Secondary | ICD-10-CM | POA: Insufficient documentation

## 2016-09-29 DIAGNOSIS — C9190 Lymphoid leukemia, unspecified not having achieved remission: Secondary | ICD-10-CM

## 2016-09-29 DIAGNOSIS — G3184 Mild cognitive impairment, so stated: Secondary | ICD-10-CM | POA: Diagnosis not present

## 2016-09-29 DIAGNOSIS — R739 Hyperglycemia, unspecified: Secondary | ICD-10-CM

## 2016-09-29 DIAGNOSIS — R0600 Dyspnea, unspecified: Secondary | ICD-10-CM

## 2016-09-29 LAB — TSH: TSH: 3.11 mIU/L

## 2016-09-29 NOTE — Progress Notes (Signed)
Location:  [facility] clinic Provider:  Alizee Maple L. Mariea [name], D.O., C.M.D.  Code Status: DNR Goals of Care:  Advanced Directives 09/29/2016  Does Patient Have a Medical Advance Directive? Yes  Type of Paramedic of Krystal [name];Living will  Does patient want to make changes to medical advance directive? -  Copy of [place] in Chart? Yes  Would patient like information on creating a medical advance directive? -  Pre-existing out of facility DNR order (yellow form or pink MOST form) -   Chief Complaint  Patient presents with  . Medical Management of Chronic Issues    8mth follow-up    HPI: Patient is a 83 y.o. female seen today for medical management of chronic diseases.    She gets pressure in her head.  No pain.  It makes her feel woozy and less steady on her feet.    She's also getting more out of breath.  If she walks to the mailbox and then walks back up hill makes her sob. Not doing adls.  No sob walking into the office. She is not very active overall.  No chest pains.  Ankles are swelling more and they are "purple" on the sides of her feet.  Her daughter does not think they look any different.    MMSE - Mini Mental State Exam 09/26/2016 09/10/2015 07/31/2014  Orientation to time 5 5 4   Orientation to Place 4 4 4   Registration 3 3 3   Attention/ Calculation 5 5 5   Recall 2 3 1   Language- name 2 objects 2 2 2   Language- repeat 1 1 1   Language- follow 3 step command 3 3 3   Language- read & follow direction 1 1 1   Write a sentence 1 1 1   Copy design 1 1 0  Total score 28 29 25   Remembers the three words today though she forgot 1 day of test.    She refuses the shingrix vaccine.   Past Medical History:  Diagnosis Date  . Arthritis   . Glaucoma   . Hyperlipidemia   . Hypertension   . Osteoporosis   . Thyroid disease     Past Surgical History:  Procedure Laterality Date  . ABDOMINAL HYSTERECTOMY  1965   partial  . CHOLECYSTECTOMY  1989  .  SKIN SURGERY  02/2014   Basel Cell removed from right side of face     No Known Allergies  Allergies as of 09/29/2016   No Known Allergies     Medication List       Accurate as of 09/29/16  1:22 PM. Always use your most recent med list.          amLODipine 5 MG tablet Commonly known as:  NORVASC TAKE 1 TABLET BY MOUTH ONCE DAILY   Fish Oil 1000 MG Caps Take 1,000 capsules by mouth 2 (two) times daily.   ibuprofen 200 MG tablet Commonly known as:  ADVIL,MOTRIN Take 200 mg by mouth as needed.   latanoprost 0.005 % ophthalmic solution Commonly known as:  XALATAN Place 1 drop into both eyes at bedtime.   levothyroxine 25 MCG tablet Commonly known as:  SYNTHROID, LEVOTHROID TAKE 1 TABLET BY MOUTH ONCE DAILY BEFORE BREAKFAST FOR  THYROID   olmesartan 20 MG tablet Commonly known as:  BENICAR TAKE 1 TABLET BY MOUTH ONCE DAILY   SENIOR MULTIVITAMIN PLUS PO Take by mouth every morning.   simvastatin 20 MG tablet Commonly known as:  ZOCOR TAKE 1 TABLET  BY MOUTH ONCE DAILY FOR CHOLESTEROL   SYSTANE OP Apply to eye.   Vitamin D 2000 units Caps Take 1 capsule (2,000 Units total) by mouth daily.       Review of Systems:  Review of Systems  Constitutional: Negative for chills, fever and malaise/fatigue.  HENT: Negative for congestion.   Eyes: Negative for blurred vision.       Glasses  Respiratory: Positive for shortness of breath. Negative for cough and sputum production.   Cardiovascular: Positive for leg swelling. Negative for chest pain and palpitations.  Gastrointestinal: Negative for abdominal pain, blood in stool, constipation and melena.  Genitourinary: Negative for dysuria.  Musculoskeletal: Negative for falls.  Skin: Negative for itching and rash.  Neurological: Positive for dizziness and headaches. Negative for loss of consciousness and weakness.  Psychiatric/Behavioral: Positive for memory loss. Negative for depression. The patient is not nervous/anxious  and does not have insomnia.     Health Maintenance  Topic Date Due  . INFLUENZA VACCINE  10/22/2016  . TETANUS/TDAP  03/24/2022  . DEXA SCAN  Completed  . PNA vac Low Risk Adult  Completed    Physical Exam: Vitals:   09/29/16 1314  BP: 138/70  Pulse: 72  Temp: (!) 97.5 F (36.4 C)  TempSrc: Oral  SpO2: 96%  Weight: 156 lb (70.8 kg)   Body mass index is 33.76 kg/m. Physical Exam  Constitutional: She is oriented to person, place, and time. She appears well-developed and well-nourished. No distress.  Cardiovascular: Normal rate, regular rhythm and intact distal pulses.   Murmur heard. Pulmonary/Chest: Effort normal and breath sounds normal. No respiratory distress.  Abdominal: Soft. Bowel sounds are normal. She exhibits no distension. There is no tenderness.  Musculoskeletal: Normal range of motion. She exhibits no tenderness.  Neurological: She is alert and oriented to person, place, and time.  Short term memory loss--repeats a few things during appt  Skin: Skin is warm and dry. Capillary refill takes less than 2 seconds.  Psychiatric: She has a normal mood and affect.    Labs reviewed: Basic Metabolic Panel:  Recent Labs  03/11/16 0836 04/24/16 1120  NA 142  --   K 4.3  --   CL 107  --   CO2 24  --   GLUCOSE 117*  --   BUN 14  --   CREATININE 1.03*  --   CALCIUM 9.3  --   TSH 4.74* 5.28*   Liver Function Tests:  Recent Labs  03/11/16 0836  AST 21  ALT 10  ALKPHOS 62  BILITOT 0.8  PROT 5.7*  ALBUMIN 3.8   No results for input(s): LIPASE, AMYLASE in the last 8760 hours. No results for input(s): AMMONIA in the last 8760 hours. CBC:  Recent Labs  03/11/16 0836 05/26/16 1059  WBC 9.3 8.7  NEUTROABS 3,162 3.0  HGB 13.3 13.4  HCT 40.8 39.7  MCV 95.8 95  PLT 214 173   Lipid Panel:  Recent Labs  03/11/16 0836  CHOL 138  HDL 66  LDLCALC 60  TRIG 59  CHOLHDL 2.1   Lab Results  Component Value Date   HGBA1C 5.5 03/11/2016     Assessment/Plan 1. Pure hypercholesterolemia -tolerates zocor well so continue moderate dose  2. Essential hypertension, benign -bp well controlled on benicar, amlodipine (likely contributes to her edema)  3. Mild cognitive impairment with memory loss -ongoing, maybe slightly worse, but remains functional and independent with just about everything, daughter helps her  4. Hyperglycemia -  Lab Results  Component Value Date   HGBA1C 5.5 03/11/2016  will need to f/u on this before next visit  5. Chronic lymphocytic leukemia ([place]) -f/u cbc to monitor  6. Acquired hypothyroidism - TSH -cont levothyroxine 35mcg daily  7. Dyspnea on exertion -likely due primarily to deconditioning -did a minute walk with her and she did not desat below 93% while walking in our halls--advised that if this worsens or is accompanied by chest pain, cardiac workup warranted  Labs/tests ordered:   Orders Placed This Encounter  Procedures  . TSH    Next appt: 6 mos med mgti   Krystal [name], D.O. [facility] [address], [place] [address] Cell Phone (Mon-Fri 8am-5pm):  [phone] On Call:  [phone] & follow prompts after 5pm & weekends Office Phone:  [phone] Office Fax:  [phone]

## 2016-09-30 DIAGNOSIS — E039 Hypothyroidism, unspecified: Secondary | ICD-10-CM | POA: Diagnosis not present

## 2016-10-02 ENCOUNTER — Encounter: Payer: Self-pay | Admitting: *Deleted

## 2016-11-24 ENCOUNTER — Other Ambulatory Visit: Payer: Self-pay | Admitting: Internal Medicine

## 2016-12-17 DIAGNOSIS — H26493 Other secondary cataract, bilateral: Secondary | ICD-10-CM | POA: Diagnosis not present

## 2016-12-17 DIAGNOSIS — H43813 Vitreous degeneration, bilateral: Secondary | ICD-10-CM | POA: Diagnosis not present

## 2016-12-17 DIAGNOSIS — H401134 Primary open-angle glaucoma, bilateral, indeterminate stage: Secondary | ICD-10-CM | POA: Diagnosis not present

## 2016-12-17 DIAGNOSIS — H52203 Unspecified astigmatism, bilateral: Secondary | ICD-10-CM | POA: Diagnosis not present

## 2017-02-05 DIAGNOSIS — H26492 Other secondary cataract, left eye: Secondary | ICD-10-CM | POA: Diagnosis not present

## 2017-03-16 ENCOUNTER — Other Ambulatory Visit: Payer: Self-pay | Admitting: Internal Medicine

## 2017-04-02 ENCOUNTER — Ambulatory Visit (INDEPENDENT_AMBULATORY_CARE_PROVIDER_SITE_OTHER): Payer: Medicare Other | Admitting: Internal Medicine

## 2017-04-02 ENCOUNTER — Encounter: Payer: Self-pay | Admitting: Internal Medicine

## 2017-04-02 VITALS — BP 130/60 | HR 69 | Temp 97.8°F | Wt 154.0 lb

## 2017-04-02 DIAGNOSIS — R0609 Other forms of dyspnea: Secondary | ICD-10-CM | POA: Diagnosis not present

## 2017-04-02 DIAGNOSIS — G3184 Mild cognitive impairment, so stated: Secondary | ICD-10-CM

## 2017-04-02 DIAGNOSIS — E7800 Pure hypercholesterolemia, unspecified: Secondary | ICD-10-CM

## 2017-04-02 DIAGNOSIS — R739 Hyperglycemia, unspecified: Secondary | ICD-10-CM | POA: Diagnosis not present

## 2017-04-02 DIAGNOSIS — C9110 Chronic lymphocytic leukemia of B-cell type not having achieved remission: Secondary | ICD-10-CM

## 2017-04-02 DIAGNOSIS — C9190 Lymphoid leukemia, unspecified not having achieved remission: Secondary | ICD-10-CM

## 2017-04-02 DIAGNOSIS — E039 Hypothyroidism, unspecified: Secondary | ICD-10-CM

## 2017-04-02 DIAGNOSIS — I10 Essential (primary) hypertension: Secondary | ICD-10-CM | POA: Diagnosis not present

## 2017-04-02 DIAGNOSIS — R0600 Dyspnea, unspecified: Secondary | ICD-10-CM

## 2017-04-02 LAB — CBC WITH DIFFERENTIAL/PLATELET
Basophils Absolute: 68 {cells}/uL (ref 0–200)
Basophils Relative: 0.6 %
Eosinophils Absolute: 158 {cells}/uL (ref 15–500)
Eosinophils Relative: 1.4 %
HCT: 40.1 % (ref 35.0–45.0)
Hemoglobin: 13.9 g/dL (ref 11.7–15.5)
Lymphs Abs: 6701 {cells}/uL — ABNORMAL HIGH (ref 850–3900)
MCH: 31.5 pg (ref 27.0–33.0)
MCHC: 34.7 g/dL (ref 32.0–36.0)
MCV: 90.9 fL (ref 80.0–100.0)
MPV: 10 fL (ref 7.5–12.5)
Monocytes Relative: 7.3 %
Neutro Abs: 3548 {cells}/uL (ref 1500–7800)
Neutrophils Relative %: 31.4 %
Platelets: 216 10*3/uL (ref 140–400)
RBC: 4.41 Million/uL (ref 3.80–5.10)
RDW: 12.1 % (ref 11.0–15.0)
Total Lymphocyte: 59.3 %
WBC mixed population: 825 {cells}/uL (ref 200–950)
WBC: 11.3 10*3/uL — ABNORMAL HIGH (ref 3.8–10.8)

## 2017-04-02 LAB — COMPLETE METABOLIC PANEL WITHOUT GFR
AG Ratio: 2.3 (calc) (ref 1.0–2.5)
ALT: 12 U/L (ref 6–29)
AST: 22 U/L (ref 10–35)
Albumin: 4.4 g/dL (ref 3.6–5.1)
Alkaline phosphatase (APISO): 64 U/L (ref 33–130)
BUN/Creatinine Ratio: 18 (calc) (ref 6–22)
BUN: 17 mg/dL (ref 7–25)
CO2: 28 mmol/L (ref 20–32)
Calcium: 9.6 mg/dL (ref 8.6–10.4)
Chloride: 105 mmol/L (ref 98–110)
Creat: 0.94 mg/dL — ABNORMAL HIGH (ref 0.60–0.88)
GFR, Est African American: 62 mL/min/{1.73_m2}
GFR, Est Non African American: 54 mL/min/{1.73_m2} — ABNORMAL LOW
Globulin: 1.9 g/dL (ref 1.9–3.7)
Glucose, Bld: 89 mg/dL (ref 65–139)
Potassium: 4.3 mmol/L (ref 3.5–5.3)
Sodium: 141 mmol/L (ref 135–146)
Total Bilirubin: 0.9 mg/dL (ref 0.2–1.2)
Total Protein: 6.3 g/dL (ref 6.1–8.1)

## 2017-04-02 NOTE — Progress Notes (Signed)
Location:  [facility] clinic Provider:  Tiffany L. Mariea Clonts, D.O., C.M.D.  Code Status: DNR Goals of Care:  Advanced Directives 09/29/2016  Does Patient Have a Medical Advance Directive? Yes  Type of Paramedic of [place];Living will  Does patient want to make changes to medical advance directive? -  Copy of [place] in Chart? Yes  Would patient like information on creating a medical advance directive? -  Pre-existing out of facility DNR order (yellow form or pink MOST form) -     Chief Complaint  Patient presents with  . Medical Management of Chronic Issues    75mth follow-up    HPI: Patient is a 84 y.o. female seen today for medical management of chronic diseases. Today accompanied by daughter. When asked about how she is doing, she states she is good. She says today they are going to buy dog food for her cocker spaniel, Dominica. 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 She truly enjoys taking care of her and this has been very purposeful and engaging for her.   Today in the office she is feeling well with a few inquires about pressure in the back of her head and shortness of breath.  Pressure in head: She has complained of this in previous visits. There have been no associated factors, though they question if weather or foods might trigger it. She has not tried any modifying factors. Daughter thinks she is a little disoriented during these events, but otherwise no neurology signs or stroke signs are seen. She has no pressure today at visit.   Shortness of breath: This is noticed during her walk down and back up the driveway, which she does twice a day for the newspaper and mail. This has increased over the last 3 months. She does not do any other forms of exercise. She denies chest pain or palpitations with the SOB. Denies SOB with walking around her home or into the office  today. There have been no modifying factors. There are no other associated factors.   Further discussion concludes she does have trouble falling asleep, but can sleep well once she does. She does wake up with her dog around 2am to let her out. It usually takes her a bit to get back to sleep after that. Overall sleeping well. She cooks her own foods and reports a good appetite with good dentition and no trouble swallowing. She denies any changes in bowel or bladder, but notes when she does have irgency with voiding. She denies leakage at this time.   As for memory and follow up on cognitive impairment her most notable change is increase inability to recall the appropriate word during conversation. She is still managing her finances and balances her own check book. She has not had any falls, but daughter says they have a cane at home should she need one. No reports of skin breakdown.  Past Medical History:  Diagnosis Date  . Arthritis   . Glaucoma   . Hyperlipidemia   . Hypertension   . Osteoporosis   . Thyroid disease     Past Surgical History:  Procedure Laterality Date  . ABDOMINAL HYSTERECTOMY  1965   partial  . CHOLECYSTECTOMY  1989  . SKIN SURGERY  02/2014   Basel Cell removed from right side of face     No Known Allergies  Outpatient Encounter Medications  as of 04/02/2017  Medication Sig  . amLODipine (NORVASC) 5 MG tablet TAKE 1 TABLET BY MOUTH ONCE DAILY  . Cholecalciferol (VITAMIN D) 2000 UNITS CAPS Take 1 capsule (2,000 Units total) by mouth daily.  Marland Kitchen ibuprofen (ADVIL,MOTRIN) 200 MG tablet Take 200 mg by mouth as needed.  . latanoprost (XALATAN) 0.005 % ophthalmic solution Place 1 drop into both eyes at bedtime.  Marland Kitchen levothyroxine (SYNTHROID, LEVOTHROID) 25 MCG tablet TAKE 1 TABLET BY MOUTH ONCE DAILY BEFORE BREAKFAST FOR  THYROID  . Multiple Vitamins-Minerals (SENIOR MULTIVITAMIN PLUS PO) Take by mouth every morning.  . olmesartan (BENICAR) 20 MG tablet TAKE 1 TABLET BY  MOUTH ONCE DAILY  . Omega-3 Fatty Acids (FISH OIL) 1000 MG CAPS Take 1,000 capsules by mouth 2 (two) times daily.  Vladimir Faster Glycol-Propyl Glycol (SYSTANE OP) Apply to eye.  . simvastatin (ZOCOR) 20 MG tablet TAKE 1 TABLET BY MOUTH ONCE DAILY FOR CHOLESTEROL   No facility-administered encounter medications on file as of 04/02/2017.     Review of Systems:  Review of Systems  Constitutional: Negative for chills and fever.  HENT: Positive for hearing loss. Negative for congestion, sinus pain and sore throat.        Mild hearing loss refuses to wear hearing aids   Eyes: Negative.        Glasses  Respiratory: Positive for shortness of breath.        When walking uphill  Cardiovascular: Positive for leg swelling. Negative for chest pain and palpitations.  Gastrointestinal: Negative.   Genitourinary: Positive for urgency. Negative for dysuria and hematuria.  Musculoskeletal: Negative.   Skin: Negative.   Neurological: Negative for dizziness and headaches.       Notes pressure in the back of her head.   No dizziness or vision changes with it.    Endo/Heme/Allergies: Bruises/bleeds easily.  Psychiatric/Behavioral: Positive for memory loss. Negative for depression. The patient is not nervous/anxious.        Can sleep, just takes time to fall asleep    Health Maintenance  Topic Date Due  . INFLUENZA VACCINE  12/03/2017 (Originally 10/22/2016)  . TETANUS/TDAP  03/24/2022  . DEXA SCAN  Completed  . PNA vac Low Risk Adult  Completed    Physical Exam: Vitals:   04/02/17 1139  BP: 130/60  Pulse: 69  Temp: 97.8 F (36.6 C)  TempSrc: Oral  SpO2: 97%  Weight: 154 lb (69.9 kg)   Body mass index is 33.33 kg/m. Physical Exam  Constitutional: She is oriented to person, place, and time. She appears well-developed and well-nourished.  HENT:  Head: Normocephalic and atraumatic.  Nose: Nose normal.  Eyes: Conjunctivae are normal. Pupils are equal, round, and reactive to light.  Neck:  Normal range of motion. Neck supple.  Cardiovascular: Normal rate, regular rhythm, normal heart sounds and intact distal pulses.  Pulmonary/Chest: Effort normal and breath sounds normal.  Abdominal: Soft. Bowel sounds are normal.  Musculoskeletal: Normal range of motion. She exhibits edema.       Right ankle: She exhibits swelling.       Left ankle: She exhibits swelling.  Neurological: She is alert and oriented to person, place, and time.  Skin: Skin is warm and dry. Capillary refill takes less than 2 seconds.  Psychiatric: She has a normal mood and affect. Her behavior is normal. Judgment and thought content normal. Cognition and memory are impaired.  Vitals reviewed.   Labs reviewed: Basic Metabolic Panel: Recent Labs    04/24/16 1120  09/29/16 1344  TSH 5.28* 3.11   Liver Function Tests: No results for input(s): AST, ALT, ALKPHOS, BILITOT, PROT, ALBUMIN in the last 8760 hours. No results for input(s): LIPASE, AMYLASE in the last 8760 hours. No results for input(s): AMMONIA in the last 8760 hours. CBC: Recent Labs    05/26/16 1059  WBC 8.7  NEUTROABS 3.0  HGB 13.4  HCT 39.7  MCV 95  PLT 173   Lipid Panel: No results for input(s): CHOL, HDL, LDLCALC, TRIG, CHOLHDL, LDLDIRECT in the last 8760 hours. Lab Results  Component Value Date   HGBA1C 5.5 03/11/2016    Procedures since last visit: No results found.  Assessment/Plan 1. Chronic lymphocytic leukemia ([place]) Stable she still notes easy bruising. She is being followed by hema/oncology. Will check labs today and  Await for follow up note in March.   - CBC with Differential/Platelet - COMPLETE METABOLIC PANEL WITH GFR  2. Hyperglycemia Will check labs to see current glucose. Consider an A1c if elevated.  - COMPLETE METABOLIC PANEL WITH GFR  3. Pure hypercholesterolemia She is currently in Zocor and tolerating it well. Family would like her to transition to Crestor for the next refill. This is because they her  current memory issues might benefit from a better side effect profile of Crestor versus Zocor. Her last lipid panel demonstrated all levels below the normal limits. Will transition her over if she needs based on next lipid panel. Look for possibly weaning of statin in near future.  - COMPLETE METABOLIC PANEL WITH GFR  4. Essential hypertension, benign Blood pressure is well controlled at this time on Benicar and Norvasc. The Norvasc might be contributing to her leg edema. Advised to start wearing compression socks, as these will offer some compression for her foot and ankle.They are easier to apply as well. Will continue to assess this at future visits. Will also obtain labs for kidney function.  - CBC with Differential/Platelet - COMPLETE METABOLIC PANEL WITH GFR  5. Dyspnea on exertion This is an on-going issue that presents with only SOB on exertion without other associated signs. As previously thought it could be related to progressive sedentary lifestyle and deconditioning. Advised her to increase her walking as she could to help with cardiovascular-respiratory improvement.  If she develops chest pain she is to stop walking and call the office for assessment and possible cardiac workup.   6. Mild cognitive impairment with memory loss Today on exam in the office she and her daughter noted she is having increased difficulty in word recall. She cant find the word she wants when talking. She is still functional and independent with ADL's. And her daughter is there to help when she needs it.  MMSE - Mini Mental State Exam 09/26/2016 09/10/2015 07/31/2014  Orientation to time 5 5 4   Orientation to Place 4 4 4   Registration 3 3 3   Attention/ Calculation 5 5 5   Recall 2 3 1   Language- name 2 objects 2 2 2   Language- repeat 1 1 1   Language- follow 3 step command 3 3 3   Language- read & follow direction 1 1 1   Write a sentence 1 1 1   Copy design 1 1 0  Total score 28 29 25     7. Acquired  hypothyroidism Thyroid function has been maintained and stable. She has no signs today in the office related to hypothyroidism. Will maintain current levothyroxine and get new lab levels to assess and will adjust medication if needed.   -TSH  Labs/tests ordered:  @ORDERS @ Next appt:  Visit date not found   Tiffany L. Reed, D.O. [facility] [address], [place] [address] Cell Phone (Mon-Fri 8am-5pm):  [phone] On Call:  [phone] & follow prompts after 5pm & weekends Office Phone:  [phone] Office Fax:  [phone]

## 2017-04-06 ENCOUNTER — Encounter: Payer: Self-pay | Admitting: *Deleted

## 2017-05-15 ENCOUNTER — Telehealth: Payer: Self-pay

## 2017-05-15 NOTE — Telephone Encounter (Signed)
Patient's daughter called to follow-up on medication concerns.  Per [employer] sent [place] a fax over a week ago about Olmesartan not being available and they have yet to hear back.  Dee (Dr.Reed's Environmental consultant) had form and states Dr.Reed had reviewed yet a decision was not made.  Note states: Generic Olmesartan tablet 20 mg for Brand Benicar is out of stock by [employer]. Please be advised that the following are also currently out of stock by the manufacture and not available. olmesartan 40 mg, irbesartan 75 mg, irbesartan 150 mg, and irbesartan 300 mg.   Please select an alternative that is not in the above list.   Once alternative selected please send to [facility] and have someone call patient's daughter Krystal [name] when completed  Krystal [name] would like for this to wait for Dr.Reed on Monday 05/18/17.

## 2017-05-18 NOTE — Telephone Encounter (Signed)
Can we check if her local pharmacy can temporarily dispense the medication?  That's what we've had to do with other patients b/c there are not equivalent options (all are on short supply after the recall).

## 2017-05-19 MED ORDER — OLMESARTAN MEDOXOMIL 20 MG PO TABS: 20.0000 mg | ORAL_TABLET | Freq: Every day | ORAL | 0 refills | Status: DC

## 2017-05-19 MED FILL — OLMESARTAN MEDOXOMIL 20 MG: 20 | 60 days supply | Qty: 60 | Fill #0

## 2017-05-19 NOTE — Telephone Encounter (Signed)
Left message on voicemail for patient to return call when available   

## 2017-05-19 NOTE — Telephone Encounter (Signed)
Spoke with Krystal [name] in agreement with rx being sent to local pharmacy.  I called [facility] and they confirmed rx in stock. RX sent  I called Krystal [name] back with a status update

## 2017-05-25 ENCOUNTER — Inpatient Hospital Stay (HOSPITAL_BASED_OUTPATIENT_CLINIC_OR_DEPARTMENT_OTHER): Payer: Medicare Other | Admitting: Hematology & Oncology

## 2017-05-25 ENCOUNTER — Encounter: Payer: Self-pay | Admitting: Hematology & Oncology

## 2017-05-25 ENCOUNTER — Inpatient Hospital Stay: Payer: Medicare Other | Attending: Hematology & Oncology

## 2017-05-25 ENCOUNTER — Other Ambulatory Visit: Payer: Self-pay

## 2017-05-25 VITALS — BP 147/55 | HR 76 | Temp 97.6°F | Resp 18 | Wt 157.0 lb

## 2017-05-25 DIAGNOSIS — Z79899 Other long term (current) drug therapy: Secondary | ICD-10-CM

## 2017-05-25 DIAGNOSIS — Z8579 Personal history of other malignant neoplasms of lymphoid, hematopoietic and related tissues: Secondary | ICD-10-CM | POA: Insufficient documentation

## 2017-05-25 DIAGNOSIS — C9110 Chronic lymphocytic leukemia of B-cell type not having achieved remission: Secondary | ICD-10-CM

## 2017-05-25 LAB — CBC WITH DIFFERENTIAL (CANCER CENTER ONLY)
Basophils Absolute: 0 10*3/uL (ref 0.0–0.1)
Basophils Relative: 0 %
EOS PCT: 2 %
Eosinophils Absolute: 0.2 10*3/uL (ref 0.0–0.5)
HCT: 40 % (ref 34.8–46.6)
Hemoglobin: 13.5 g/dL (ref 11.6–15.9)
Lymphocytes Relative: 54 %
Lymphs Abs: 6.4 10*3/uL — ABNORMAL HIGH (ref 0.9–3.3)
MCH: 32.2 pg (ref 26.0–34.0)
MCHC: 33.8 g/dL (ref 32.0–36.0)
MCV: 95.5 fL (ref 81.0–101.0)
MONO ABS: 1.1 10*3/uL — AB (ref 0.1–0.9)
MONOS PCT: 10 %
Neutro Abs: 4 10*3/uL (ref 1.5–6.5)
Neutrophils Relative %: 34 %
PLATELETS: 189 10*3/uL (ref 145–400)
RBC: 4.19 MIL/uL (ref 3.70–5.32)
RDW: 12.9 % (ref 11.1–15.7)
WBC Count: 11.6 10*3/uL — ABNORMAL HIGH (ref 3.9–10.0)

## 2017-05-25 LAB — SAVE SMEAR

## 2017-05-25 NOTE — Progress Notes (Signed)
Hematology and Oncology Follow Up Visit  Krystal [name] [id number] 07-15-1925 84 y.o. 05/25/2017   Principle Diagnosis:  Stage A CLL  Current Therapy:   Observation    Interim History:  Krystal [name] is here today with her daughter for a follow-up.  We see her yearly.  So far, she is done quite well.  She had a good year last year.  She really had no health problems.  She is had no fever.  She had no infections.  She is had no swollen lymph nodes.  She has had no nausea or vomiting.  She has had no rashes.  There is been no cough or shortness of breath.  She does not think there is been any change in her medications.  Overall, I would say that her performance status is ECOG 2.    Medications:  Allergies as of 05/25/2017   No Known Allergies     Medication List        Accurate as of 05/25/17  1:05 PM. Always use your most recent med list.          amLODipine 5 MG tablet Commonly known as:  NORVASC TAKE 1 TABLET BY MOUTH ONCE DAILY   Fish Oil 1000 MG Caps Take 1,000 capsules by mouth 2 (two) times daily.   ibuprofen 200 MG tablet Commonly known as:  ADVIL,MOTRIN Take 200 mg by mouth as needed.   latanoprost 0.005 % ophthalmic solution Commonly known as:  XALATAN Place 1 drop into both eyes at bedtime.   levothyroxine 25 MCG tablet Commonly known as:  SYNTHROID, LEVOTHROID TAKE 1 TABLET BY MOUTH ONCE DAILY BEFORE BREAKFAST FOR  THYROID   olmesartan 20 MG tablet Commonly known as:  BENICAR Take 1 tablet (20 mg total) by mouth daily.   SENIOR MULTIVITAMIN PLUS PO Take by mouth every morning.   simvastatin 20 MG tablet Commonly known as:  ZOCOR TAKE 1 TABLET BY MOUTH ONCE DAILY FOR CHOLESTEROL   SYSTANE OP Apply to eye.   Vitamin D 2000 units Caps Take 1 capsule (2,000 Units total) by mouth daily.       Allergies: No Known Allergies  Past Medical History, Surgical history, Social history, and Family History were reviewed and updated.  Review of  Systems: Review of Systems  Constitutional: Negative.   HENT: Negative.   Eyes: Negative.   Respiratory: Negative.   Cardiovascular: Negative.   Gastrointestinal: Negative.   Genitourinary: Negative.   Musculoskeletal: Negative.   Skin: Negative.   Neurological: Negative.   Endo/Heme/Allergies: Negative.   Psychiatric/Behavioral: Negative.       Physical Exam:  weight is 157 lb (71.2 kg). Her oral temperature is 97.6 F (36.4 C). Her blood pressure is 147/55 (abnormal) and her pulse is 76. Her respiration is 18 and oxygen saturation is 100%.   Wt Readings from Last 3 Encounters:  05/25/17 157 lb (71.2 kg)  04/02/17 154 lb (69.9 kg)  09/29/16 156 lb (70.8 kg)    Physical Exam  Constitutional: She is oriented to person, place, and time.  HENT:  Head: Normocephalic and atraumatic.  Mouth/Throat: Oropharynx is clear and moist.  Eyes: EOM are normal. Pupils are equal, round, and reactive to light.  Neck: Normal range of motion.  Cardiovascular: Normal rate, regular rhythm and normal heart sounds.  Pulmonary/Chest: Effort normal and breath sounds normal.  Abdominal: Soft. Bowel sounds are normal.  Musculoskeletal: Normal range of motion. She exhibits no edema, tenderness or deformity.  Lymphadenopathy:    She  has no cervical adenopathy.  Neurological: She is alert and oriented to person, place, and time.  Skin: Skin is warm and dry. No rash noted. No erythema.  Psychiatric: She has a normal mood and affect. Her behavior is normal. Judgment and thought content normal.  Vitals reviewed.   Lab Results  Component Value Date   WBC 11.6 (H) 05/25/2017   HGB 13.9 04/02/2017   HCT 40.0 05/25/2017   MCV 95.5 05/25/2017   PLT 189 05/25/2017   No results found for: FERRITIN, IRON, TIBC, UIBC, IRONPCTSAT Lab Results  Component Value Date   RBC 4.19 05/25/2017   No results found for: Nils Pyle [facility] Lab Results  Component Value Date   IGGSERUM 1,090  05/24/2014   IGA 77 05/24/2014   IGMSERUM 64 05/24/2014   Lab Results  Component Value Date   TOTALPROTELP 6.2 05/24/2014   ALBUMINELP 62.1 05/24/2014   A1GS 4.7 05/24/2014   A2GS 11.7 05/24/2014   BETS 6.8 05/24/2014   BETA2SER 4.3 05/24/2014   GAMS 10.4 (L) 05/24/2014   MSPIKE NOT DET 05/24/2014   SPEI * 05/24/2014     Chemistry      Component Value Date/Time   NA 141 04/02/2017 1236   NA 140 05/23/2015 1126   K 4.3 04/02/2017 1236   K 4.0 05/23/2015 1126   CL 105 04/02/2017 1236   CL 102 05/24/2014 1124   CO2 28 04/02/2017 1236   CO2 25 05/23/2015 1126   BUN 17 04/02/2017 1236   BUN 16.0 05/23/2015 1126   CREATININE 0.94 (H) 04/02/2017 1236   CREATININE 1.1 05/23/2015 1126      Component Value Date/Time   CALCIUM 9.6 04/02/2017 1236   CALCIUM 9.1 05/23/2015 1126   ALKPHOS 62 03/11/2016 0836   ALKPHOS 79 05/23/2015 1126   AST 22 04/02/2017 1236   AST 20 05/23/2015 1126   ALT 12 04/02/2017 1236   ALT 9 05/23/2015 1126   BILITOT 0.9 04/02/2017 1236   BILITOT 0.56 05/23/2015 1126     Impression and Plan: Krystal [name] is a pleasant 84 yo white female with stage A CLL. She is doing well and so far.  So far, I see no evidence of the CLL is causing her problems.  We will still follow her up yearly.  Her blood counts look fantastic.  Her blood smear looks stable and I do not see anything that would suggest progression of the CLL.    [name], MD 3/4/20191:05 PM

## 2017-06-11 ENCOUNTER — Other Ambulatory Visit: Payer: Self-pay | Admitting: Internal Medicine

## 2017-06-24 DIAGNOSIS — H401134 Primary open-angle glaucoma, bilateral, indeterminate stage: Secondary | ICD-10-CM | POA: Diagnosis not present

## 2017-08-26 MED FILL — OLMESARTAN MEDOXOMIL 20 MG: 20 | 30 days supply | Qty: 30 | Fill #1

## 2017-10-01 ENCOUNTER — Ambulatory Visit (INDEPENDENT_AMBULATORY_CARE_PROVIDER_SITE_OTHER): Payer: Medicare Other | Admitting: Internal Medicine

## 2017-10-01 ENCOUNTER — Encounter: Payer: Self-pay | Admitting: Internal Medicine

## 2017-10-01 VITALS — BP 130/62 | HR 86 | Temp 97.4°F | Resp 20 | Ht <= 58 in | Wt 158.2 lb

## 2017-10-01 DIAGNOSIS — E039 Hypothyroidism, unspecified: Secondary | ICD-10-CM | POA: Diagnosis not present

## 2017-10-01 DIAGNOSIS — C9190 Lymphoid leukemia, unspecified not having achieved remission: Secondary | ICD-10-CM | POA: Diagnosis not present

## 2017-10-01 DIAGNOSIS — W19XXXA Unspecified fall, initial encounter: Secondary | ICD-10-CM | POA: Diagnosis not present

## 2017-10-01 DIAGNOSIS — I10 Essential (primary) hypertension: Secondary | ICD-10-CM

## 2017-10-01 DIAGNOSIS — G3184 Mild cognitive impairment, so stated: Secondary | ICD-10-CM | POA: Diagnosis not present

## 2017-10-01 DIAGNOSIS — R739 Hyperglycemia, unspecified: Secondary | ICD-10-CM | POA: Diagnosis not present

## 2017-10-01 DIAGNOSIS — C9110 Chronic lymphocytic leukemia of B-cell type not having achieved remission: Secondary | ICD-10-CM

## 2017-10-01 DIAGNOSIS — E7800 Pure hypercholesterolemia, unspecified: Secondary | ICD-10-CM

## 2017-10-01 DIAGNOSIS — R2689 Other abnormalities of gait and mobility: Secondary | ICD-10-CM | POA: Insufficient documentation

## 2017-10-01 NOTE — Progress Notes (Signed)
Location:  [facility] clinic Provider:  Sabine Tenenbaum L. Mariea Clonts, D.O., C.M.D.  Code Status: DNR Goals of Care:  Advanced Directives 09/29/2016  Does Patient Have a Medical Advance Directive? Yes  Type of Paramedic of [place];Living will  Does patient want to make changes to medical advance directive? -  Copy of [place] in Chart? Yes  Would patient like information on creating a medical advance directive? -  Pre-existing out of facility DNR order (yellow form or pink MOST form) -     Chief Complaint  Patient presents with  . Medical Management of Chronic Issues    6 mo f/u- hyperglycemia, HTN, hypothyroidism,    HPI: Patient is a 84 y.o. female seen today for medical management of chronic diseases.    [place] in the house a couple weeks ago when carrying a package into the house.  She got a bad bruise on her right leg.  She lost her balance.  She thinks she still had the package in her hands.  She thought, I'm going to fall.  She passed out she thinks b/c she woke up on the floor stretched out on her back.  No chest pain or palpitations before the fall.  Her daughter's impression is that her hands are full.  Usually uses one to furniture surf.  Screen door closed before her foot was in the door.  Her shoe was left outside the screen door.  Her daughter thinks the door pushed her foot and knocked her off balance.    No injuries to her head.  Fortunately, no fractures occurred given her osteoporosis.    She's otherwise doing fine.    She remains on cholesterol medication for hyperlipidemia because she likes to eat liberally.    Her bp is well controlled.    She is coming up as overweight due to bmi, but is unsteady and just had a fall so weight reasonable for her.    She sees hematology just once a year for f/u of her CLL.  It's been stable for years.    Past Medical History:  Diagnosis Date  . Arthritis   . Glaucoma   . Hyperlipidemia   .  Hypertension   . Osteoporosis   . Thyroid disease     Past Surgical History:  Procedure Laterality Date  . ABDOMINAL HYSTERECTOMY  1965   partial  . CHOLECYSTECTOMY  1989  . SKIN SURGERY  02/2014   [place] Cell removed from right side of face     No Known Allergies  Outpatient Encounter Medications as of 10/01/2017  Medication Sig  . amLODipine (NORVASC) 5 MG tablet TAKE 1 TABLET BY MOUTH ONCE DAILY  . Cholecalciferol (VITAMIN D) 2000 UNITS CAPS Take 1 capsule (2,000 Units total) by mouth daily.  Marland Kitchen ibuprofen (ADVIL,MOTRIN) 200 MG tablet Take 200 mg by mouth as needed.  . latanoprost (XALATAN) 0.005 % ophthalmic solution Place 1 drop into both eyes at bedtime.  Marland Kitchen levothyroxine (SYNTHROID, LEVOTHROID) 25 MCG tablet TAKE 1 TABLET BY MOUTH ONCE DAILY BEFORE BREAKFAST FOR  THYROID  . Multiple Vitamins-Minerals (SENIOR MULTIVITAMIN PLUS PO) Take by mouth every morning.  . olmesartan (BENICAR) 20 MG tablet Take 1 tablet (20 mg total) by mouth daily.  . Omega-3 Fatty Acids (FISH OIL) 1000 MG CAPS Take 1,000 capsules by mouth 2 (two) times daily.  Vladimir Faster Glycol-Propyl Glycol (SYSTANE OP) Apply to eye.  . simvastatin (ZOCOR) 20 MG tablet TAKE 1 TABLET BY MOUTH ONCE  DAILY FOR CHOLESTEROL   No facility-administered encounter medications on file as of 10/01/2017.     Review of Systems:  Review of Systems  Constitutional: Negative for chills, fever and malaise/fatigue.  HENT: Negative for congestion and hearing loss.   Eyes: Negative for blurred vision.       Glasses  Respiratory: Negative for cough and shortness of breath.   Cardiovascular: Negative for chest pain, palpitations and leg swelling.  Gastrointestinal: Negative for abdominal pain, blood in stool, constipation and melena.  Genitourinary: Negative for dysuria, frequency and urgency.  Musculoskeletal: Positive for falls and joint pain.  Skin: Negative for itching and rash.  Neurological: Negative for dizziness and loss of  consciousness.  Endo/Heme/Allergies: Bruises/bleeds easily.  Psychiatric/Behavioral: Positive for memory loss. Negative for depression. The patient is not nervous/anxious and does not have insomnia.     Health Maintenance  Topic Date Due  . INFLUENZA VACCINE  12/03/2017 (Originally 10/22/2017)  . TETANUS/TDAP  03/24/2022  . DEXA SCAN  Completed  . PNA vac Low Risk Adult  Completed    Physical Exam: Vitals:   10/01/17 1305  BP: 130/62  Pulse: 86  Resp: 20  Temp: (!) 97.4 F (36.3 C)  SpO2: 93%  Weight: 158 lb 3.2 oz (71.8 kg)  Height: 4\' 9"  (1.448 m)   Body mass index is 34.23 kg/m. Physical Exam  Constitutional: She is oriented to person, place, and time. She appears well-developed and well-nourished. No distress.  HENT:  Head: Normocephalic and atraumatic.  Cardiovascular: Normal rate, regular rhythm, normal heart sounds and intact distal pulses.  Pulmonary/Chest: Effort normal and breath sounds normal. No respiratory distress.  Abdominal: Bowel sounds are normal.  Musculoskeletal: Normal range of motion.  Watched her walk:  She goes off to the side some, shuffles slightly (does not fully lift her feet)  Neurological: She is alert and oriented to person, place, and time.  Skin: Skin is warm and dry.  Ecchymoses over right proximal shin with hematoma underlying  Psychiatric: She has a normal mood and affect.    Labs reviewed: Basic Metabolic Panel: Recent Labs    04/02/17 1236  NA 141  K 4.3  CL 105  CO2 28  GLUCOSE 89  BUN 17  CREATININE 0.94*  CALCIUM 9.6   Liver Function Tests: Recent Labs    04/02/17 1236  AST 22  ALT 12  BILITOT 0.9  PROT 6.3   No results for input(s): LIPASE, AMYLASE in the last 8760 hours. No results for input(s): AMMONIA in the last 8760 hours. CBC: Recent Labs    04/02/17 1236 05/25/17 1136  WBC 11.3* 11.6*  NEUTROABS 3,548 4.0  HGB 13.9 13.5  HCT 40.1 40.0  MCV 90.9 95.5  PLT 216 189   Lipid Panel: No results for  input(s): CHOL, HDL, LDLCALC, TRIG, CHOLHDL, LDLDIRECT in the last 8760 hours. Lab Results  Component Value Date   HGBA1C 5.5 03/11/2016   Assessment/Plan 1. Hyperglycemia -cont to monitor - COMPLETE METABOLIC PANEL WITH GFR; Future  2. Pure hypercholesterolemia - continues on zocor, no recent lipid so will check since she's still on medication and tolerating well - Lipid panel; Future  3. Essential hypertension, benign -bp at goal with current regimen, cont same - CBC with Differential/Platelet; Future - COMPLETE METABOLIC PANEL WITH GFR; Future  4. Fall, initial encounter -seems it was mechanical due to not being able to use her one hand to support herself when going thru a doorway -counseled on avoiding carrying things  with two hands--her daughter also encouraged her to go through the garage and use a bag to put over her shoulder instead -also encouraged cane use -she adamantly refused home health PT   5. Mild cognitive impairment with memory loss -seems pretty stable year to year--now almost 84 yo  6. Acquired hypothyroidism - cont levothyroxine, f/u lab at next appt - TSH; Future  7. Balance problem -gait not bad, but had mechanical fall so recommended therapy, but she refused it; encouraged cane and other advice as in #4  8. Chronic lymphocytic leukemia ([place]) -has been stable in remission for years  Labs/tests ordered:   Orders Placed This Encounter  Procedures  . CBC with Differential/Platelet    Standing Status:   Future    Standing Expiration Date:   10/02/2018  . COMPLETE METABOLIC PANEL WITH GFR    Standing Status:   Future    Standing Expiration Date:   10/02/2018  . Lipid panel    Standing Status:   Future    Standing Expiration Date:   10/02/2018  . TSH    Standing Status:   Future    Standing Expiration Date:   10/02/2018   Next appt:  6 mos for med mgt, come fasting for labs  [place]. Harlowe Dowler, D.O. [facility] [address], [place] [address] Cell Phone (Mon-Fri 8am-5pm):  [phone] On Call:  [phone] & follow prompts after 5pm & weekends Office Phone:  [phone] Office Fax:  [phone]

## 2017-10-12 ENCOUNTER — Telehealth: Payer: Self-pay | Admitting: Internal Medicine

## 2017-10-12 NOTE — Telephone Encounter (Signed)
I called the pt to schedule her AWV-S.  She asked that I call her daughter, [name].  I left a message for her daughter asking her to call me at [phone] to schedule AWV. VDM (DD)

## 2017-11-14 ENCOUNTER — Other Ambulatory Visit: Payer: Self-pay | Admitting: Internal Medicine

## 2017-12-23 NOTE — Telephone Encounter (Signed)
[name] called me back and we scheduled AWV for 01/25/18.

## 2017-12-25 DIAGNOSIS — H1859 Other hereditary corneal dystrophies: Secondary | ICD-10-CM | POA: Diagnosis not present

## 2017-12-25 DIAGNOSIS — H401134 Primary open-angle glaucoma, bilateral, indeterminate stage: Secondary | ICD-10-CM | POA: Diagnosis not present

## 2017-12-25 DIAGNOSIS — H52203 Unspecified astigmatism, bilateral: Secondary | ICD-10-CM | POA: Diagnosis not present

## 2017-12-25 DIAGNOSIS — H26491 Other secondary cataract, right eye: Secondary | ICD-10-CM | POA: Diagnosis not present

## 2018-01-25 ENCOUNTER — Ambulatory Visit: Payer: Medicare Other

## 2018-01-27 ENCOUNTER — Other Ambulatory Visit: Payer: Self-pay | Admitting: Internal Medicine

## 2018-02-01 ENCOUNTER — Ambulatory Visit (INDEPENDENT_AMBULATORY_CARE_PROVIDER_SITE_OTHER): Payer: Medicare Other

## 2018-02-01 VITALS — BP 130/70 | HR 69 | Temp 97.7°F | Ht <= 58 in | Wt 155.0 lb

## 2018-02-01 DIAGNOSIS — Z0000 Encounter for general adult medical examination without abnormal findings: Secondary | ICD-10-CM | POA: Diagnosis not present

## 2018-02-01 NOTE — Patient Instructions (Signed)
[name] , Thank you for taking time to come for your Medicare Wellness Visit. I appreciate your ongoing commitment to your health goals. Please review the following plan we discussed and let me know if I can assist you in the future.   Screening recommendations/referrals: Colonoscopy excluded, over age 82 Mammogram excluded, over age 72 Bone Density up to date Recommended yearly ophthalmology/optometry visit for glaucoma screening and checkup Recommended yearly dental visit for hygiene and checkup  Vaccinations: Influenza vaccine due, declined Pneumococcal vaccine up to date, completed Tdap vaccine up to date, due 03/24/2022 Shingles vaccine due, declined    Advanced directives: In chart  Conditions/risks identified: none  Next appointment: Dr. Mariea Clonts 04/05/2018 @ 11am            Tyson Dense, RN 02/04/2019 @ 11:30am   Preventive Care 65 Years and Older, Female Preventive care refers to lifestyle choices and visits with your health care provider that can promote health and wellness. What does preventive care include?  A yearly physical exam. This is also called an annual well check.  Dental exams once or twice a year.  Routine eye exams. Ask your health care provider how often you should have your eyes checked.  Personal lifestyle choices, including:  Daily care of your teeth and gums.  Regular physical activity.  Eating a healthy diet.  Avoiding tobacco and drug use.  Limiting alcohol use.  Practicing safe sex.  Taking low-dose aspirin every day.  Taking vitamin and mineral supplements as recommended by your health care provider. What happens during an annual well check? The services and screenings done by your health care provider during your annual well check will depend on your age, overall health, lifestyle risk factors, and family history of disease. Counseling  Your health care provider may ask you questions about your:  Alcohol use.  Tobacco  use.  Drug use.  Emotional well-being.  Home and relationship well-being.  Sexual activity.  Eating habits.  History of falls.  Memory and ability to understand (cognition).  Work and work Statistician.  Reproductive health. Screening  You may have the following tests or measurements:  Height, weight, and BMI.  Blood pressure.  Lipid and cholesterol levels. These may be checked every 5 years, or more frequently if you are over 36 years old.  Skin check.  Lung cancer screening. You may have this screening every year starting at age 51 if you have a 30-pack-year history of smoking and currently smoke or have quit within the past 15 years.  Fecal occult blood test (FOBT) of the stool. You may have this test every year starting at age 72.  Flexible sigmoidoscopy or colonoscopy. You may have a sigmoidoscopy every 5 years or a colonoscopy every 10 years starting at age 67.  Hepatitis C blood test.  Hepatitis B blood test.  Sexually transmitted disease (STD) testing.  Diabetes screening. This is done by checking your blood sugar (glucose) after you have not eaten for a while (fasting). You may have this done every 1-3 years.  Bone density scan. This is done to screen for osteoporosis. You may have this done starting at age 47.  Mammogram. This may be done every 1-2 years. Talk to your health care provider about how often you should have regular mammograms. Talk with your health care provider about your test results, treatment options, and if necessary, the need for more tests. Vaccines  Your health care provider may recommend certain vaccines, such as:  Influenza vaccine. This  is recommended every year.  Tetanus, diphtheria, and acellular pertussis (Tdap, Td) vaccine. You may need a Td booster every 10 years.  Zoster vaccine. You may need this after age 48.  Pneumococcal 13-valent conjugate (PCV13) vaccine. One dose is recommended after age 103.  Pneumococcal  polysaccharide (PPSV23) vaccine. One dose is recommended after age 48. Talk to your health care provider about which screenings and vaccines you need and how often you need them. This information is not intended to replace advice given to you by your health care provider. Make sure you discuss any questions you have with your health care provider. Document Released: 04/06/2015 Document Revised: 11/28/2015 Document Reviewed: 01/09/2015 Elsevier Interactive Patient Education  2017 [employer] Prevention in the Home Falls can cause injuries. They can happen to people of all ages. There are many things you can do to make your home safe and to help prevent falls. What can I do on the outside of my home?  Regularly fix the edges of walkways and driveways and fix any cracks.  Remove anything that might make you trip as you walk through a door, such as a raised step or threshold.  Trim any bushes or trees on the path to your home.  Use bright outdoor lighting.  Clear any walking paths of anything that might make someone trip, such as rocks or tools.  Regularly check to see if handrails are loose or broken. Make sure that both sides of any steps have handrails.  Any raised decks and porches should have guardrails on the edges.  Have any leaves, snow, or ice cleared regularly.  Use sand or salt on walking paths during winter.  Clean up any spills in your garage right away. This includes oil or grease spills. What can I do in the bathroom?  Use night lights.  Install grab bars by the toilet and in the tub and shower. Do not use towel bars as grab bars.  Use non-skid mats or decals in the tub or shower.  If you need to sit down in the shower, use a plastic, non-slip stool.  Keep the floor dry. Clean up any water that spills on the floor as soon as it happens.  Remove soap buildup in the tub or shower regularly.  Attach bath mats securely with double-sided non-slip rug  tape.  Do not have throw rugs and other things on the floor that can make you trip. What can I do in the bedroom?  Use night lights.  Make sure that you have a light by your bed that is easy to reach.  Do not use any sheets or blankets that are too big for your bed. They should not hang down onto the floor.  Have a firm chair that has side arms. You can use this for support while you get dressed.  Do not have throw rugs and other things on the floor that can make you trip. What can I do in the kitchen?  Clean up any spills right away.  Avoid walking on wet floors.  Keep items that you use a lot in easy-to-reach places.  If you need to reach something above you, use a strong step stool that has a grab bar.  Keep electrical cords out of the way.  Do not use floor polish or wax that makes floors slippery. If you must use wax, use non-skid floor wax.  Do not have throw rugs and other things on the floor that can make you  trip. What can I do with my stairs?  Do not leave any items on the stairs.  Make sure that there are handrails on both sides of the stairs and use them. Fix handrails that are broken or loose. Make sure that handrails are as long as the stairways.  Check any carpeting to make sure that it is firmly attached to the stairs. Fix any carpet that is loose or worn.  Avoid having throw rugs at the top or bottom of the stairs. If you do have throw rugs, attach them to the floor with carpet tape.  Make sure that you have a light switch at the top of the stairs and the bottom of the stairs. If you do not have them, ask someone to add them for you. What else can I do to help prevent falls?  Wear shoes that:  Do not have high heels.  Have rubber bottoms.  Are comfortable and fit you well.  Are closed at the toe. Do not wear sandals.  If you use a stepladder:  Make sure that it is fully opened. Do not climb a closed stepladder.  Make sure that both sides of the  stepladder are locked into place.  Ask someone to hold it for you, if possible.  Clearly mark and make sure that you can see:  Any grab bars or handrails.  First and last steps.  Where the edge of each step is.  Use tools that help you move around (mobility aids) if they are needed. These include:  Canes.  Walkers.  Scooters.  Crutches.  Turn on the lights when you go into a dark area. Replace any light bulbs as soon as they burn out.  Set up your furniture so you have a clear path. Avoid moving your furniture around.  If any of your floors are uneven, fix them.  If there are any pets around you, be aware of where they are.  Review your medicines with your doctor. Some medicines can make you feel dizzy. This can increase your chance of falling. Ask your doctor what other things that you can do to help prevent falls. This information is not intended to replace advice given to you by your health care provider. Make sure you discuss any questions you have with your health care provider. Document Released: 01/04/2009 Document Revised: 08/16/2015 Document Reviewed: 04/14/2014 Elsevier Interactive Patient Education  2017 [employer].

## 2018-02-01 NOTE — Progress Notes (Signed)
Subjective:   Krystal [name] is a 84 y.o. female who presents for Medicare Annual (Subsequent) preventive examination.  Last AWV-09/26/2016      Objective:     Vitals: BP 130/70 (BP Location: Left Arm, Patient Position: Sitting)   Pulse 69   Temp 97.7 F (36.5 C) (Oral)   Ht 4\' 9"  (1.448 m)   Wt 155 lb (70.3 kg)   SpO2 96%   BMI 33.54 kg/m   Body mass index is 33.54 kg/m.  Advanced Directives 09/29/2016 09/26/2016 03/13/2016 09/10/2015 05/23/2015 05/23/2015 02/04/2015  Does Patient Have a Medical Advance Directive? Yes Yes Yes Yes No No Yes  Type of Paramedic of [place];Living will Living will;Healthcare Power of Attorney Living will;Healthcare Power of [place];Living will - - [place];Living will;Out of facility DNR (pink MOST or yellow form)  Does patient want to make changes to medical advance directive? - No - Patient declined - - - - No - Patient declined  Copy of [place] in Chart? Yes Yes Yes Yes - - -  Would patient like information on creating a medical advance directive? - - - - No - patient declined information - -  Pre-existing out of facility DNR order (yellow form or pink MOST form) - - - - - - Yellow form placed in chart (order not valid for inpatient use)    Tobacco Social History   Tobacco Use  Smoking Status Never Smoker  Smokeless Tobacco Never Used  Tobacco Comment   never used tobacco     Counseling given: Not Answered Comment: never used tobacco   Clinical Intake:  Pre-visit preparation completed: No  Pain : 0-10 Pain Score: 3  Pain Type: Chronic pain Pain Location: Hip Pain Orientation: Right Pain Onset: More than a month ago Pain Frequency: Intermittent     Diabetes: No  How often do you need to have someone help you when you read instructions, pamphlets, or other written materials from your doctor or pharmacy?: 1 - Never What is the last grade  level you completed in school?: High School  Interpreter Needed?: No  Information entered by :: Tyson Dense, RN  Past Medical History:  Diagnosis Date  . Arthritis   . Glaucoma   . Hyperlipidemia   . Hypertension   . Osteoporosis   . Thyroid disease    Past Surgical History:  Procedure Laterality Date  . ABDOMINAL HYSTERECTOMY  1965   partial  . CHOLECYSTECTOMY  1989  . SKIN SURGERY  02/2014   Basel Cell removed from right side of face    Family History  Problem Relation Age of Onset  . Cancer Mother   . Cancer Father        lung  . Heart disease Father    Social History   Socioeconomic History  . Marital status: Widowed    Spouse name: Not on file  . Number of children: Not on file  . Years of education: Not on file  . Highest education level: Not on file  Occupational History  . Not on file  Social Needs  . Financial resource strain: Not hard at all  . Food insecurity:    Worry: Never true    Inability: Never true  . Transportation needs:    Medical: No    Non-medical: No  Tobacco Use  . Smoking status: Never Smoker  . Smokeless tobacco: Never Used  . Tobacco comment: never used tobacco  Substance and Sexual Activity  . Alcohol use: No    Alcohol/week: 0.0 standard drinks  . Drug use: No  . Sexual activity: Not on file  Lifestyle  . Physical activity:    Days per week: 0 days    Minutes per session: 0 min  . Stress: Not at all  Relationships  . Social connections:    Talks on phone: More than three times a week    Gets together: More than three times a week    Attends religious service: Never    Active member of club or organization: No    Attends meetings of clubs or organizations: Never    Relationship status: Widowed  Other Topics Concern  . Not on file  Social History Narrative   Caffeine: Coffee   Widow, married in [place]   Lives in house, 1 stories, one person, one dog   Current/past profession: Network engineer   Exercise: No   Living  Will: Yes   DNR: No, would not like to discuss               Outpatient Encounter Medications as of 02/01/2018  Medication Sig  . amLODipine (NORVASC) 5 MG tablet TAKE 1 TABLET BY MOUTH ONCE DAILY  . Cholecalciferol (VITAMIN D) 2000 UNITS CAPS Take 1 capsule (2,000 Units total) by mouth daily.  Marland Kitchen ibuprofen (ADVIL,MOTRIN) 200 MG tablet Take 200 mg by mouth as needed.  . latanoprost (XALATAN) 0.005 % ophthalmic solution Place 1 drop into both eyes at bedtime.  Marland Kitchen levothyroxine (SYNTHROID, LEVOTHROID) 25 MCG tablet TAKE 1 TABLET BY MOUTH 1  TIME DAILY BEFORE BREAKFAST FOR THYROID  . Multiple Vitamins-Minerals (SENIOR MULTIVITAMIN PLUS PO) Take by mouth every morning.  . olmesartan (BENICAR) 20 MG tablet TAKE 1 TABLET BY MOUTH ONCE DAILY  . Omega-3 Fatty Acids (FISH OIL) 1000 MG CAPS Take 1,000 capsules by mouth 2 (two) times daily.  Vladimir Faster Glycol-Propyl Glycol (SYSTANE OP) Apply to eye.  . simvastatin (ZOCOR) 20 MG tablet TAKE 1 TABLET BY MOUTH ONCE DAILY FOR CHOLESTEROL   No facility-administered encounter medications on file as of 02/01/2018.     Activities of Daily Living In your present state of health, do you have any difficulty performing the following activities: 02/01/2018  Hearing? N  Vision? N  Difficulty concentrating or making decisions? N  Walking or climbing stairs? Y  Dressing or bathing? N  Doing errands, shopping? N  Preparing Food and eating ? N  Using the Toilet? N  In the past six months, have you accidently leaked urine? N  Do you have problems with loss of bowel control? N  Managing your Medications? N  Managing your Finances? N  Housekeeping or managing your Housekeeping? N  Some recent data might be hidden    Patient Care Team: Gayland Curry, DO as PCP - General (Geriatric Medicine) [name], MD as Consulting Physician (Oncology) [name], MD as Consulting Physician (Ophthalmology)    Assessment:   This is a routine wellness  examination for [place].  Exercise Activities and Dietary recommendations Current Exercise Habits: The patient does not participate in regular exercise at present, Exercise limited by: None identified  Goals    . Eat more fruits and vegetables     Patient will start increasing fruits and vegetables and protein in diet.    . Patient Stated     Patient will increase water to 4 cups a day       Fall Risk Fall  Risk  02/01/2018 10/01/2017 04/02/2017 09/26/2016 05/26/2016  Falls in the past year? 1 Yes No No No  Number falls in past yr: 1 1 - - -  Injury with Fall? 1 No - - -  Comment bruise L leg - - - -  Risk for fall due to : - - - - -   Is the patient's home free of loose throw rugs in walkways, pet beds, electrical cords, etc?   yes      Grab bars in the bathroom? yes      Handrails on the stairs?   yes      Adequate lighting?   yes  Timed Get Up and Go performed: 15 seconds  Depression Screen PHQ 2/9 Scores 02/01/2018 10/01/2017 04/02/2017 09/26/2016  PHQ - 2 Score 0 0 0 0     Cognitive Function MMSE - Mini Mental State Exam 02/01/2018 09/26/2016 09/10/2015 07/31/2014  Orientation to time 5 5 5 4   Orientation to Place 4 4 4 4   Registration 3 3 3 3   Attention/ Calculation 5 5 5 5   Recall 3 2 3 1   Language- name 2 objects 2 2 2 2   Language- repeat 1 1 1 1   Language- follow 3 step command 3 3 3 3   Language- read & follow direction 1 1 1 1   Write a sentence 1 1 1 1   Copy design 1 1 1  0  Total score 29 28 29 25         Immunization History  Administered Date(s) Administered  . Influenza,inj,Quad PF,6+ Mos 02/02/2015  . Influenza-Unspecified 01/02/2014, 01/11/2016  . Pneumococcal Conjugate-13 04/24/2014  . Pneumococcal-Unspecified 03/15/2013  . Tdap 03/24/2012    Qualifies for Shingles Vaccine? Yes, educated and declined  Screening Tests Health Maintenance  Topic Date Due  . INFLUENZA VACCINE  10/22/2017  . TETANUS/TDAP  03/24/2022  . DEXA SCAN  Completed  . PNA vac Low  Risk Adult  Completed    Cancer Screenings: Lung: Low Dose CT Chest recommended if Age 12-80 years, 30 pack-year currently smoking OR have quit w/in 15years. Patient does not qualify. Breast:  Up to date on Mammogram? Yes   Up to date of Bone Density/Dexa? Yes Colorectal: up to date  Additional Screenings: : Hepatitis C Screening: declined Flu vaccine due: declined    Plan:    I have personally reviewed and addressed the Medicare Annual Wellness questionnaire and have noted the following in the patient's chart:  A. Medical and social history B. Use of alcohol, tobacco or illicit drugs  C. Current medications and supplements D. Functional ability and status E.  Nutritional status F.  Physical activity G. Advance directives H. List of other physicians I.  Hospitalizations, surgeries, and ER visits in previous 12 months J.  [place] to include hearing, vision, cognitive, depression L. Referrals and appointments - none  In addition, I have reviewed and discussed with patient certain preventive protocols, quality metrics, and best practice recommendations. A written personalized care plan for preventive services as well as general preventive health recommendations were provided to patient.  See attached scanned questionnaire for additional information.   Signed,   Tyson Dense, RN Nurse Health Advisor  Patient Concerns: None

## 2018-03-02 ENCOUNTER — Emergency Department (HOSPITAL_BASED_OUTPATIENT_CLINIC_OR_DEPARTMENT_OTHER)
Admission: EM | Admit: 2018-03-02 | Discharge: 2018-03-02 | Disposition: A | Discharge status: Home / Self Care | Payer: Medicare Other | Attending: Emergency Medicine | Admitting: Emergency Medicine

## 2018-03-02 ENCOUNTER — Encounter (HOSPITAL_BASED_OUTPATIENT_CLINIC_OR_DEPARTMENT_OTHER): Payer: Self-pay | Admitting: *Deleted

## 2018-03-02 ENCOUNTER — Emergency Department (HOSPITAL_BASED_OUTPATIENT_CLINIC_OR_DEPARTMENT_OTHER): Payer: Medicare Other

## 2018-03-02 ENCOUNTER — Telehealth: Payer: Self-pay | Admitting: *Deleted

## 2018-03-02 ENCOUNTER — Other Ambulatory Visit: Payer: Self-pay

## 2018-03-02 DIAGNOSIS — Z79899 Other long term (current) drug therapy: Secondary | ICD-10-CM | POA: Diagnosis not present

## 2018-03-02 DIAGNOSIS — S51011A Laceration without foreign body of right elbow, initial encounter: Secondary | ICD-10-CM | POA: Diagnosis not present

## 2018-03-02 DIAGNOSIS — E039 Hypothyroidism, unspecified: Secondary | ICD-10-CM | POA: Insufficient documentation

## 2018-03-02 DIAGNOSIS — W010XXA Fall on same level from slipping, tripping and stumbling without subsequent striking against object, initial encounter: Secondary | ICD-10-CM | POA: Insufficient documentation

## 2018-03-02 DIAGNOSIS — Y9389 Activity, other specified: Secondary | ICD-10-CM | POA: Diagnosis not present

## 2018-03-02 DIAGNOSIS — S51811A Laceration without foreign body of right forearm, initial encounter: Secondary | ICD-10-CM | POA: Diagnosis not present

## 2018-03-02 DIAGNOSIS — Z9049 Acquired absence of other specified parts of digestive tract: Secondary | ICD-10-CM | POA: Diagnosis not present

## 2018-03-02 DIAGNOSIS — Z856 Personal history of leukemia: Secondary | ICD-10-CM | POA: Insufficient documentation

## 2018-03-02 DIAGNOSIS — Y998 Other external cause status: Secondary | ICD-10-CM | POA: Diagnosis not present

## 2018-03-02 DIAGNOSIS — R55 Syncope and collapse: Secondary | ICD-10-CM

## 2018-03-02 DIAGNOSIS — I10 Essential (primary) hypertension: Secondary | ICD-10-CM | POA: Insufficient documentation

## 2018-03-02 DIAGNOSIS — Y92003 Bedroom of unspecified non-institutional (private) residence as the place of occurrence of the external cause: Secondary | ICD-10-CM | POA: Insufficient documentation

## 2018-03-02 DIAGNOSIS — S6010XA Contusion of unspecified finger with damage to nail, initial encounter: Secondary | ICD-10-CM | POA: Diagnosis not present

## 2018-03-02 LAB — BASIC METABOLIC PANEL
Anion gap: 9 (ref 5–15)
BUN: 19 mg/dL (ref 8–23)
CALCIUM: 9.3 mg/dL (ref 8.9–10.3)
CHLORIDE: 105 mmol/L (ref 98–111)
CO2: 23 mmol/L (ref 22–32)
CREATININE: 1.17 mg/dL — AB (ref 0.44–1.00)
GFR calc non Af Amer: 41 mL/min — ABNORMAL LOW (ref >60)
GFR, EST AFRICAN AMERICAN: 48 mL/min — AB (ref >60)
Glucose, Bld: 124 mg/dL — ABNORMAL HIGH (ref 70–99)
Potassium: 3.8 mmol/L (ref 3.5–5.1)
Sodium: 137 mmol/L (ref 135–145)

## 2018-03-02 LAB — CBC
HCT: 43.7 % (ref 36.0–46.0)
Hemoglobin: 14.2 g/dL (ref 12.0–15.0)
MCH: 31.2 pg (ref 26.0–34.0)
MCHC: 32.5 g/dL (ref 30.0–36.0)
MCV: 96 fL (ref 80.0–100.0)
Platelets: 245 10*3/uL (ref 150–400)
RBC: 4.55 MIL/uL (ref 3.87–5.11)
RDW: 12.7 % (ref 11.5–15.5)
WBC: 15.7 10*3/uL — ABNORMAL HIGH (ref 4.0–10.5)
nRBC: 0 % (ref 0.0–0.2)

## 2018-03-02 NOTE — Telephone Encounter (Signed)
Agree, likely needed wound cleaned and sutures, xray of hand and sounds like syncope workup, unfortunately.  She's a pretty spunky one though. Hope things go well.  I see she's in the ED.

## 2018-03-02 NOTE — ED Provider Notes (Signed)
[facility] EMERGENCY DEPARTMENT Provider Note   CSN: [id number] Arrival date & time: 03/02/18  1201     History   Chief Complaint Chief Complaint  Patient presents with  . Fall  . Loss of Consciousness  . Arm Injury    HPI Krystal [name] is a 84 y.o. female.  HPI Patient is a 84 year old female presents the emergency department with complaints of pain in the right hand with bruising of the right little finger as well as skin tear to her right forearm and right elbow.  She has full range of motion of bilateral shoulders elbows and wrist.  She states that she is walking towards her dresser and the next thing she knew she was on the ground.  Family also noted a small bruise on her left cheek.  She is not on anticoagulation.  She is been ambulatory since the event.  No preceding chest pain or palpitations.  One episode of similar syncope 6 to 8 months ago per family.  No recent illness.  Eating and drinking normally.  No recent fever or chills or upper respiratory symptoms.  No chest pain or shortness of breath at this time.  At this time she has only mild pain in the right hand but no other focus complaints   Past Medical History:  Diagnosis Date  . Arthritis   . Glaucoma   . Hyperlipidemia   . Hypertension   . Osteoporosis   . Thyroid disease     Patient Active Problem List   Diagnosis Date Noted  . Mild cognitive impairment with memory loss 10/01/2017  . Acquired hypothyroidism 10/01/2017  . Balance problem 10/01/2017  . Pure hypercholesterolemia 09/29/2016  . Essential hypertension, benign 09/29/2016  . Right torticollis 09/10/2015  . Great toe pain 09/10/2015  . Neuropathic pain of hand 07/20/2014  . Hyperglycemia 07/20/2014  . Chronic lymphocytic leukemia ([place]) 04/24/2014    Past Surgical History:  Procedure Laterality Date  . ABDOMINAL HYSTERECTOMY  1965   partial  . CHOLECYSTECTOMY  1989  . SKIN SURGERY  02/2014   Basel Cell removed from right  side of face      OB History   None      Home Medications    Prior to Admission medications   Medication Sig Start Date End Date Taking? Authorizing Provider  amLODipine (NORVASC) 5 MG tablet TAKE 1 TABLET BY MOUTH ONCE DAILY 11/16/17  Yes Reed, Tiffany L, DO  Cholecalciferol (VITAMIN D) 2000 UNITS CAPS Take 1 capsule (2,000 Units total) by mouth daily. 04/24/14  Yes Reed, Tiffany L, DO  levothyroxine (SYNTHROID, LEVOTHROID) 25 MCG tablet TAKE 1 TABLET BY MOUTH 1  TIME DAILY BEFORE BREAKFAST FOR THYROID 11/16/17  Yes Reed, Tiffany L, DO  Multiple Vitamins-Minerals (SENIOR MULTIVITAMIN PLUS PO) Take by mouth every morning.   Yes [provider]  olmesartan (BENICAR) 20 MG tablet TAKE 1 TABLET BY MOUTH ONCE DAILY 01/27/18  Yes Reed, Tiffany L, DO  Omega-3 Fatty Acids (FISH OIL) 1000 MG CAPS Take 1,000 capsules by mouth 2 (two) times daily.   Yes [provider]  Polyethyl Glycol-Propyl Glycol (SYSTANE OP) Apply to eye.   Yes [provider]  simvastatin (ZOCOR) 20 MG tablet TAKE 1 TABLET BY MOUTH ONCE DAILY FOR CHOLESTEROL 11/16/17  Yes Reed, Tiffany L, DO  ibuprofen (ADVIL,MOTRIN) 200 MG tablet Take 200 mg by mouth as needed.    [provider]  latanoprost (XALATAN) 0.005 % ophthalmic solution Place 1 drop into  both eyes at bedtime.    [provider]    Family History Family History  Problem Relation Age of Onset  . Cancer Mother   . Cancer Father        lung  . Heart disease Father     Social History Social History   Tobacco Use  . Smoking status: Never Smoker  . Smokeless tobacco: Never Used  . Tobacco comment: never used tobacco  Substance Use Topics  . Alcohol use: No    Alcohol/week: 0.0 standard drinks  . Drug use: No     Allergies   Patient has no known allergies.   Review of Systems Review of Systems  All other systems reviewed and are negative.    Physical Exam Updated Vital Signs BP (!) 146/78   Pulse 76    Temp 98.2 F (36.8 C) (Oral)   Resp 16   Ht 4\' 11"  (1.499 m)   Wt 66.7 kg   SpO2 100%   BMI 29.69 kg/m   Physical Exam  Constitutional: She is oriented to person, place, and time. She appears well-developed and well-nourished. No distress.  HENT:  Head: Normocephalic.  Small bruise noted to her left anterior cheek  Eyes: EOM are normal.  Neck: Normal range of motion. Neck supple.  C-spine nontender.  Cardiovascular: Normal rate, regular rhythm and normal heart sounds.  Pulmonary/Chest: Effort normal and breath sounds normal.  Abdominal: Soft. She exhibits no distension. There is no tenderness.  Musculoskeletal: Normal range of motion.  Skin tear of the right elbow and right posterior forearm without active bleeding.  Full range of motion of bilateral shoulders, elbows, wrist.  Full range of motion bilateral hips, knees, ankles.  5 out of 5 strength bilateral upper and lower extremity major muscle groups.  Bruising noted of the ring finger and little finger of the right hand with full range of motion and normal right radial pulse.  Neurological: She is alert and oriented to person, place, and time.  Skin: Skin is warm and dry.  Psychiatric: She has a normal mood and affect. Judgment normal.  Nursing note and vitals reviewed.    ED Treatments / Results  Labs (all labs ordered are listed, but only abnormal results are displayed) Labs Reviewed  BASIC METABOLIC PANEL - Abnormal; Notable for the following components:      Result Value   Glucose, Bld 124 (*)    Creatinine, Ser 1.17 (*)    GFR calc non Af Amer 41 (*)    GFR calc Af Amer 48 (*)    All other components within normal limits  CBC - Abnormal; Notable for the following components:   WBC 15.7 (*)    All other components within normal limits    EKG EKG Interpretation  Date/Time:  Tuesday March 02 2018 12:38:08 EST Ventricular Rate:  79 PR Interval:    QRS Duration: 99 QT Interval:  408 QTC Calculation: 468 R  Axis:   72 Text Interpretation:  Sinus rhythm Ventricular trigeminy Low voltage, extremity leads No old tracing to compare Confirmed by Jola Schmidt [phone]) on 03/02/2018 2:03:19 PM   Radiology Dg Hand Complete Right  Result Date: 03/02/2018 CLINICAL DATA:  The patient sustained a fall today and has bruising over the fifth finger. EXAM: RIGHT HAND - COMPLETE 3+ VIEW COMPARISON:  None. FINDINGS: The bones are subjectively osteopenic. No acute fracture or dislocation is observed. There is severe deforming osteoarthritic change of the DIP joints diffusely. There is fusion  across the IP joints of the fifth finger and the PIP joint of the fourth finger. There is narrowing of the MCP joints with the first and second MCP joints being most significantly involved. There is moderate degenerative change of the first [facility] joint. Milder degenerative changes of the other CMC joints and of the intercarpal joints is observed. There is mild soft tissue swelling over the fifth finger especially proximally. IMPRESSION: There is no acute fracture nor dislocation of the bones of the right hand. 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 There is moderate osteoarthritic change elsewhere. Electronically Signed   By: David  Martinique M.D.   On: 03/02/2018 13:53    Procedures Procedures (including critical care time)  Medications Ordered in ED Medications - No data to display   Initial Impression / Assessment and Plan / ED Course  I have reviewed the triage vital signs and the nursing notes.  Pertinent labs & imaging results that were available during my care of the patient were reviewed by me and considered in my medical decision making (see chart for details).    Labs without significant abnormality.  Imaging of the right hand demonstrates no acute fracture.  Full range of motion of major joints.  No headache.  No  neck pain.  C-spine cleared by Nexus criteria.  Vital signs are stable.  No indication for additional work-up or hospitalization at this time.  Stable for discharge from the emergency department.  Final Clinical Impressions(s) / ED Diagnoses   Final diagnoses:  Syncope, unspecified syncope type  Skin tear of right forearm without complication, initial encounter    ED Discharge Orders    None       Jola Schmidt, MD 03/02/18 1528

## 2018-03-02 NOTE — ED Notes (Signed)
Pt on monitor 

## 2018-03-02 NOTE — ED Triage Notes (Signed)
Pt fell, woke up not knowing how she fell or how long she was there. r arm has a skin tear.

## 2018-03-02 NOTE — ED Notes (Signed)
Dressing to right arm and elbow skin tear.

## 2018-03-02 NOTE — Telephone Encounter (Signed)
Caregiver called and stated that patient blacked out and fell early this morning. Patient states that she does not remember falling. Caregiver stated that she has a 2 inch open wound on right arm and cannot straighten her pinky. No other symptoms noted.   No available appointments in office.   Advised to take patient to ER/Urgent Care to be evaluated. Caregiver agreed.

## 2018-04-02 ENCOUNTER — Other Ambulatory Visit: Payer: Self-pay | Admitting: *Deleted

## 2018-04-02 DIAGNOSIS — I10 Essential (primary) hypertension: Secondary | ICD-10-CM

## 2018-04-02 DIAGNOSIS — R739 Hyperglycemia, unspecified: Secondary | ICD-10-CM

## 2018-04-02 DIAGNOSIS — E7800 Pure hypercholesterolemia, unspecified: Secondary | ICD-10-CM | POA: Diagnosis not present

## 2018-04-02 DIAGNOSIS — E039 Hypothyroidism, unspecified: Secondary | ICD-10-CM | POA: Diagnosis not present

## 2018-04-02 LAB — TSH: TSH: 4.09 mIU/L (ref 0.40–4.50)

## 2018-04-02 LAB — COMPLETE METABOLIC PANEL WITH GFR
AG Ratio: 1.8 (calc) (ref 1.0–2.5)
ALT: 9 U/L (ref 6–29)
AST: 20 U/L (ref 10–35)
Albumin: 4.2 g/dL (ref 3.6–5.1)
Alkaline phosphatase (APISO): 57 U/L (ref 33–130)
BUN/Creatinine Ratio: 20 (calc) (ref 6–22)
BUN: 21 mg/dL (ref 7–25)
CO2: 27 mmol/L (ref 20–32)
Calcium: 9.7 mg/dL (ref 8.6–10.4)
Chloride: 104 mmol/L (ref 98–110)
Creat: 1.07 mg/dL — ABNORMAL HIGH (ref 0.60–0.88)
GFR, Est African American: 53 mL/min/{1.73_m2} — ABNORMAL LOW (ref >=60)
GFR, Est Non African American: 46 mL/min/{1.73_m2} — ABNORMAL LOW (ref >=60)
Globulin: 2.3 g/dL (calc) (ref 1.9–3.7)
Glucose, Bld: 104 mg/dL — ABNORMAL HIGH (ref 65–99)
Potassium: 4.5 mmol/L (ref 3.5–5.3)
Sodium: 141 mmol/L (ref 135–146)
Total Bilirubin: 1 mg/dL (ref 0.2–1.2)
Total Protein: 6.5 g/dL (ref 6.1–8.1)

## 2018-04-02 LAB — CBC WITH DIFFERENTIAL/PLATELET
Absolute Monocytes: 877 cells/uL (ref 200–950)
Basophils Absolute: 56 cells/uL (ref 0–200)
Basophils Relative: 0.5 %
Eosinophils Absolute: 433 cells/uL (ref 15–500)
Eosinophils Relative: 3.9 %
HCT: 41.8 % (ref 35.0–45.0)
Hemoglobin: 14.2 g/dL (ref 11.7–15.5)
Lymphs Abs: 6416 cells/uL — ABNORMAL HIGH (ref 850–3900)
MCH: 31.6 pg (ref 27.0–33.0)
MCHC: 34 g/dL (ref 32.0–36.0)
MCV: 93.1 fL (ref 80.0–100.0)
MPV: 9.9 fL (ref 7.5–12.5)
Monocytes Relative: 7.9 %
Neutro Abs: 3319 cells/uL (ref 1500–7800)
Neutrophils Relative %: 29.9 %
Platelets: 238 10*3/uL (ref 140–400)
RBC: 4.49 10*6/uL (ref 3.80–5.10)
RDW: 12.5 % (ref 11.0–15.0)
Total Lymphocyte: 57.8 %
WBC: 11.1 10*3/uL — ABNORMAL HIGH (ref 3.8–10.8)

## 2018-04-02 LAB — LIPID PANEL
Cholesterol: 156 mg/dL (ref <200)
HDL: 59 mg/dL (ref >50)
LDL Cholesterol (Calc): 81 mg/dL (calc)
Non-HDL Cholesterol (Calc): 97 mg/dL (calc) (ref <130)
Total CHOL/HDL Ratio: 2.6 (calc) (ref <5.0)
Triglycerides: 76 mg/dL (ref <150)

## 2018-04-05 ENCOUNTER — Other Ambulatory Visit: Payer: Self-pay | Admitting: Internal Medicine

## 2018-04-05 ENCOUNTER — Ambulatory Visit (INDEPENDENT_AMBULATORY_CARE_PROVIDER_SITE_OTHER): Payer: Medicare Other | Admitting: Internal Medicine

## 2018-04-05 ENCOUNTER — Encounter: Payer: Self-pay | Admitting: Internal Medicine

## 2018-04-05 ENCOUNTER — Ambulatory Visit
Admission: RE | Admit: 2018-04-05 | Discharge: 2018-04-05 | Disposition: A | Discharge status: Home / Self Care | Payer: Medicare Other | Source: Ambulatory Visit | Attending: Internal Medicine | Admitting: Internal Medicine

## 2018-04-05 VITALS — BP 122/68 | HR 75 | Temp 97.7°F | Ht <= 58 in | Wt 150.0 lb

## 2018-04-05 DIAGNOSIS — R1031 Right lower quadrant pain: Secondary | ICD-10-CM

## 2018-04-05 DIAGNOSIS — M533 Sacrococcygeal disorders, not elsewhere classified: Secondary | ICD-10-CM

## 2018-04-05 DIAGNOSIS — W19XXXS Unspecified fall, sequela: Secondary | ICD-10-CM

## 2018-04-05 DIAGNOSIS — R6889 Other general symptoms and signs: Secondary | ICD-10-CM | POA: Diagnosis not present

## 2018-04-05 DIAGNOSIS — R55 Syncope and collapse: Secondary | ICD-10-CM | POA: Diagnosis not present

## 2018-04-05 DIAGNOSIS — R2681 Unsteadiness on feet: Secondary | ICD-10-CM

## 2018-04-05 DIAGNOSIS — R410 Disorientation, unspecified: Secondary | ICD-10-CM | POA: Diagnosis not present

## 2018-04-05 DIAGNOSIS — M1611 Unilateral primary osteoarthritis, right hip: Secondary | ICD-10-CM | POA: Diagnosis not present

## 2018-04-05 NOTE — Progress Notes (Signed)
Location:  [facility] clinic Provider:  Trayon Krantz L. Mariea Clonts, D.O., C.M.D.  Code Status: DNR--as discussed before Goals of Care:  Advanced Directives 04/11/2018  Does Patient Have a Medical Advance Directive? Yes  Type of Paramedic of [place];Living will  Does patient want to make changes to medical advance directive? No - Patient declined  Copy of [place] in Chart? Yes - validated most recent copy scanned in chart (See row information)  Would patient like information on creating a medical advance directive? -  Pre-existing out of facility DNR order (yellow form or pink MOST form) -     Chief Complaint  Patient presents with  . Medical Management of Chronic Issues    73mth follow-up    HPI: Patient is a 85 y.o. female seen today for medical management of chronic diseases, but pt and her daughter, [name], have concerns, as well.    She fell in December and her right forearm is healing.  She still has groin pain on the right.  Also hurts in right SI region.  Both bother her when she stands and at night.  It's hard to get comfortable.  She just found herself lying on the floor and saw her arm with the skin peeled back on her forearm--it has healed now.    She was lying on her stomach after her fall and they think she struck her arm on furniture on the way down.    Today, she is c/o pressure in her head--she's had some version of this complaint for several years, but now she's more bothered by it since the fall.  She says she doesn't know what she's doing.  She's c/o this more in the past month.  It's in the back of her head.  Stays most of the day if she gets it.  It makes her feel confused, not clear in the head.  No vision changes.  Doesn't hear too good, but not related.  No ringing in her ears.  No vertigo.  Not dizzy.  No weakness or numbness.  It's not like a headache.  BP is good.  No congestion or stuffiness.  No ear pressure either.    MMSE - Mini  Mental State Exam 04/05/2018 02/01/2018 09/26/2016  Orientation to time 5 5 5   Orientation to Place 4 4 4   Registration 3 3 3   Attention/ Calculation 5 5 5   Recall 3 3 2   Language- name 2 objects 2 2 2   Language- repeat 0 1 1  Language- follow 3 step command 3 3 3   Language- read & follow direction 1 1 1   Write a sentence 1 1 1   Copy design 1 1 1   Total score 28 29 28   down 1 pt on mmse, still failed clock (sentence was "I wish I was somewhere else") b/c she dislikes coming to the doctor.  Past Medical History:  Diagnosis Date  . Arthritis   . Glaucoma   . Hyperlipidemia   . Hypertension   . Osteoporosis   . Thyroid disease     Past Surgical History:  Procedure Laterality Date  . ABDOMINAL HYSTERECTOMY  1965   partial  . CHOLECYSTECTOMY  1989  . SKIN SURGERY  02/2014   Basel Cell removed from right side of face     No Known Allergies  Outpatient Encounter Medications as of 04/05/2018  Medication Sig  . amLODipine (NORVASC) 5 MG tablet TAKE 1 TABLET BY MOUTH ONCE DAILY  . Cholecalciferol (  VITAMIN D) 2000 UNITS CAPS Take 1 capsule (2,000 Units total) by mouth daily.  Marland Kitchen ibuprofen (ADVIL,MOTRIN) 200 MG tablet Take 200 mg by mouth as needed.  . latanoprost (XALATAN) 0.005 % ophthalmic solution Place 1 drop into both eyes at bedtime.  Marland Kitchen levothyroxine (SYNTHROID, LEVOTHROID) 25 MCG tablet TAKE 1 TABLET BY MOUTH 1  TIME DAILY BEFORE BREAKFAST FOR THYROID  . Multiple Vitamins-Minerals (SENIOR MULTIVITAMIN PLUS PO) Take by mouth every morning.  . olmesartan (BENICAR) 20 MG tablet TAKE 1 TABLET BY MOUTH ONCE DAILY  . Omega-3 Fatty Acids (FISH OIL) 1000 MG CAPS Take 1,000 capsules by mouth 2 (two) times daily.  Vladimir Faster Glycol-Propyl Glycol (SYSTANE OP) Apply to eye.  . simvastatin (ZOCOR) 20 MG tablet TAKE 1 TABLET BY MOUTH ONCE DAILY FOR CHOLESTEROL   No facility-administered encounter medications on file as of 04/05/2018.     Review of Systems:  Review of Systems    Constitutional: Positive for malaise/fatigue. Negative for chills and fever.  HENT: Positive for hearing loss. Negative for congestion, ear pain, sinus pain, sore throat and tinnitus.   Eyes: Negative for blurred vision and double vision.       Glasses  Respiratory: Negative for cough and shortness of breath.   Cardiovascular: Negative for chest pain and palpitations.  Gastrointestinal: Negative for abdominal pain, blood in stool, constipation, diarrhea, melena, nausea and vomiting.  Genitourinary: Negative for dysuria.  Musculoskeletal: Positive for back pain, falls and joint pain. Negative for myalgias.  Skin: Negative for itching and rash.  Neurological: Negative for dizziness, tingling, tremors, sensory change, speech change, focal weakness, loss of consciousness, weakness and headaches.       Head pressure as in hpi  Endo/Heme/Allergies: Bruises/bleeds easily.  Psychiatric/Behavioral: Positive for memory loss. Negative for depression. The patient is not nervous/anxious and does not have insomnia.     Health Maintenance  Topic Date Due  . INFLUENZA VACCINE  10/22/2017  . TETANUS/TDAP  03/24/2022  . DEXA SCAN  Completed  . PNA vac Low Risk Adult  Completed    Physical Exam: Vitals:   04/05/18 1108  BP: 122/68  Pulse: 75  Temp: 97.7 F (36.5 C)  TempSrc: Oral  SpO2: 97%  Weight: 150 lb (68 kg)  Height: 4\' 9"  (1.448 m)   Body mass index is 32.46 kg/m. Physical Exam Constitutional:      General: She is not in acute distress.    Appearance: Normal appearance. She is not ill-appearing or toxic-appearing.  HENT:     Head: Normocephalic.     Comments: Some residual ecchymoses on left cheek from fall    Right Ear: External ear normal.     Left Ear: External ear normal.     Nose: Nose normal. No congestion.     Mouth/Throat:     Pharynx: Oropharynx is clear.  Eyes:     Extraocular Movements: Extraocular movements intact.     Pupils: Pupils are equal, round, and  reactive to light.     Comments: Some challenges following my commands today like following my finger with her eyes, but did fine once we got her to do it  Cardiovascular:     Rate and Rhythm: Normal rate and regular rhythm.     Pulses: Normal pulses.     Heart sounds: Murmur present.  Pulmonary:     Effort: Pulmonary effort is normal.     Breath sounds: Normal breath sounds.  Musculoskeletal:     Comments: Tender in right groin  area on palpation and right SI region and indicates it hurts when she bears weight to walk or get up on exam table  Skin:    General: Skin is warm and dry.     Capillary Refill: Capillary refill takes less than 2 seconds.     Comments: Slight residual ecchymoses left cheek (now pale yellow); right forearm skin tear has healed with scarring  Neurological:     General: No focal deficit present.     Mental Status: She is alert and oriented to person, place, and time.     Cranial Nerves: No cranial nerve deficit.     Motor: No weakness.     Coordination: Coordination normal.     Gait: Gait abnormal.     Deep Tendon Reflexes: Reflexes abnormal.     Comments: Wide-based gait and does stagger to the side which is not new for her over years  Psychiatric:        Mood and Affect: Mood normal.        Behavior: Behavior normal.     Labs reviewed: Basic Metabolic Panel: Recent Labs    03/02/18 1243 04/02/18 1129 04/02/18 1133  NA 137 141  --   K 3.8 4.5  --   CL 105 104  --   CO2 23 27  --   GLUCOSE 124* 104*  --   BUN 19 21  --   CREATININE 1.17* 1.07*  --   CALCIUM 9.3 9.7  --   TSH  --   --  4.09   Liver Function Tests: Recent Labs    04/02/18 1129  AST 20  ALT 9  BILITOT 1.0  PROT 6.5   No results for input(s): LIPASE, AMYLASE in the last 8760 hours. No results for input(s): AMMONIA in the last 8760 hours. CBC: Recent Labs    05/25/17 1136 03/02/18 1243 04/02/18 1134  WBC 11.6* 15.7* 11.1*  NEUTROABS 4.0  --  3,319  HGB 13.5 14.2 14.2   HCT 40.0 43.7 41.8  MCV 95.5 96.0 93.1  PLT 189 245 238   Lipid Panel: Recent Labs    04/02/18 1130  CHOL 156  HDL 59  LDLCALC 81  TRIG 76  CHOLHDL 2.6   Lab Results  Component Value Date   HGBA1C 5.5 03/11/2016    Procedures since last visit: Ct Head Wo Contrast  Result Date: 04/05/2018 CLINICAL DATA:  Confusion and headache.  Fall 1 month prior EXAM: CT HEAD WITHOUT CONTRAST TECHNIQUE: Contiguous axial images were obtained from the base of the skull through the vertex without intravenous contrast. COMPARISON:  None. FINDINGS: Brain: There is moderate sulcal atrophy throughout the brain. The ventricles are within normal limits for age. There is no intracranial mass, hemorrhage, extra-axial fluid collection, or midline shift. There is patchy small vessel disease in the centra semiovale bilaterally. Small vessel disease is also noted in the midbrain and pons. There is small vessel disease adjacent to the frontal horn of each lateral ventricle and in the anterior limb of each external capsule. No acute appearing infarct is demonstrable on this study. Vascular: There is no hyperdense vessel. There is calcification in each carotid siphon region. Skull: Bony calvarium appears intact. Sinuses/Orbits: There is a small retention cyst in the posterior left maxillary antrum. There is mucosal thickening in several ethmoid air cells. Other paranasal sinuses are clear. Orbits appear symmetric bilaterally. Patient appears to have had cataract surgery bilaterally. Other: Mastoid air cells are clear. IMPRESSION: Atrophy with patchy  small vessel disease in the periventricular white matter and external capsules. There is small vessel disease in the midbrain and pons. No acute infarct evident. No mass or hemorrhage. There are foci of arterial vascular calcification. There are areas of paranasal sinus disease. Electronically Signed   By: Lowella Grip III M.D.   On: 04/05/2018 14:32   Dg Hip Unilat With  Pelvis 2-3 Views Right  Result Date: 04/05/2018 CLINICAL DATA:  Pain.  Fall 1 month prior EXAM: DG HIP (WITH OR WITHOUT PELVIS) 2-3V RIGHT COMPARISON:  None. FINDINGS: Standing frontal pelvis as well as standing frontal and standing lateral right hip images were obtained. There is no appreciable acute fracture or dislocation. There is moderate symmetric narrowing of each hip joint. No erosive change. There is common and superficial femoral artery calcification bilaterally. There is a probable bone island in the left sacral ala medially. IMPRESSION: Moderate symmetric narrowing of each hip joint. No acute fracture or dislocation. Foci of femoral artery calcification/atherosclerosis noted. Probable bone island medial left sacral ala. Electronically Signed   By: Lowella Grip III M.D.   On: 04/05/2018 14:33    Assessment/Plan 1. Unsteady gait - this is not new, but her syncopal episode and worsening of the fullness in her head is new  -her daughter is especially concerned b/c pt has been more confused also and did strike her face (left cheek) during her fall - CT Head Wo Contrast; Future - Ambulatory referral to [place] for PT for strengthening, balance training and eval, assistive device use (encouraged cane use at least on the regular--she'd come w/o it today), OT home safety eval also--pt was more accepting when her daughter pointed out her desire to stay in her home as long as possible   2. Syncope, unspecified syncope type - cardiac w/u in ED was negative--pt resistant to testing and seeing doctors--will r/o any new neuro findings given her increased difficulty following commands today  - CT Head Wo Contrast; Future - Ambulatory referral to [place]  3. Fall, sequela - one month ago, but has had persistent symptoms of increased fullness in head, right groin and right si pain, plus some worsening confusion - CT Head Wo Contrast; Future - Ambulatory referral to [place]  4.  Fullness in head - eval CT brain - CT Head Wo Contrast; Future - Ambulatory referral to [place]  5. Unilateral groin pain, right - I'm concerned she may have had an indolent pelvic fx not detected during ED visit but did discuss that this can be hip OA also flared up with the fall - will need PT either way - encouraged use of tylenol and topical therapy otc if needed--avoid regular use of nsaids like ibuprofen, aleve (ok a few times a week max and always with food) - Ambulatory referral to [employer]  6. Pain of right sacroiliac joint - was new post-fall, again r/o pelvic or right hip pathology with imaging - tx with tylenol and topicals, PT - Ambulatory referral to [place]  Labs/tests ordered:   Orders Placed This Encounter  Procedures  . CT Head Wo Contrast    Standing Status:   Future    Number of Occurrences:   1    Standing Expiration Date:   07/05/2019    Order Specific Question:   ** REASON FOR EXAM (FREE TEXT)    Answer:   prior fall in december (struck face), fullness in head, unsteady gait    Order Specific Question:  Preferred imaging location?    Answer:   [id number]    Order Specific Question:   Radiology Contrast Protocol - do NOT remove file path    Answer:   [email or web address]  . Ambulatory referral to Home Health    Referral Priority:   Routine    Referral Type:   Home Health Care    Referral Reason:   Specialty Services Required    Requested Specialty:   [place]    Number of Visits Requested:   1   Next appt:  6 mos for CPE, come fasting for labs; but, if worse or results of imaging concerning, will have her come back acutely  Brookley Spitler L. Gunnison Chahal, D.O. [facility] [address], [place] [address] Cell Phone (Mon-Fri 8am-5pm):  [phone] On Call:  [phone] & follow prompts after 5pm & weekends Office Phone:  [phone] Office Fax:   [phone]

## 2018-04-05 NOTE — Patient Instructions (Addendum)
I'm concerned you may have a broken pelvis.   We will check your right hip and pelvis for fracture with an xray--you may get this done at [facility] [address] at your convenience. Our referral coordinator, Lattie Haw, will call with an appointment for your CT of your head to evaluate your head due to the fullness. I've referred you for home health therapy to keep you safe at home.

## 2018-04-06 DIAGNOSIS — H409 Unspecified glaucoma: Secondary | ICD-10-CM | POA: Diagnosis not present

## 2018-04-06 DIAGNOSIS — I10 Essential (primary) hypertension: Secondary | ICD-10-CM | POA: Diagnosis not present

## 2018-04-06 DIAGNOSIS — R296 Repeated falls: Secondary | ICD-10-CM | POA: Diagnosis not present

## 2018-04-06 DIAGNOSIS — M810 Age-related osteoporosis without current pathological fracture: Secondary | ICD-10-CM | POA: Diagnosis not present

## 2018-04-06 DIAGNOSIS — R2689 Other abnormalities of gait and mobility: Secondary | ICD-10-CM | POA: Diagnosis not present

## 2018-04-06 DIAGNOSIS — M533 Sacrococcygeal disorders, not elsewhere classified: Secondary | ICD-10-CM | POA: Diagnosis not present

## 2018-04-09 DIAGNOSIS — M533 Sacrococcygeal disorders, not elsewhere classified: Secondary | ICD-10-CM | POA: Diagnosis not present

## 2018-04-09 DIAGNOSIS — M810 Age-related osteoporosis without current pathological fracture: Secondary | ICD-10-CM | POA: Diagnosis not present

## 2018-04-09 DIAGNOSIS — R296 Repeated falls: Secondary | ICD-10-CM | POA: Diagnosis not present

## 2018-04-09 DIAGNOSIS — I10 Essential (primary) hypertension: Secondary | ICD-10-CM | POA: Diagnosis not present

## 2018-04-09 DIAGNOSIS — H409 Unspecified glaucoma: Secondary | ICD-10-CM | POA: Diagnosis not present

## 2018-04-09 DIAGNOSIS — R2689 Other abnormalities of gait and mobility: Secondary | ICD-10-CM | POA: Diagnosis not present

## 2018-04-13 DIAGNOSIS — R296 Repeated falls: Secondary | ICD-10-CM | POA: Diagnosis not present

## 2018-04-13 DIAGNOSIS — R2689 Other abnormalities of gait and mobility: Secondary | ICD-10-CM | POA: Diagnosis not present

## 2018-04-13 DIAGNOSIS — I10 Essential (primary) hypertension: Secondary | ICD-10-CM | POA: Diagnosis not present

## 2018-04-13 DIAGNOSIS — M533 Sacrococcygeal disorders, not elsewhere classified: Secondary | ICD-10-CM | POA: Diagnosis not present

## 2018-04-13 DIAGNOSIS — M810 Age-related osteoporosis without current pathological fracture: Secondary | ICD-10-CM | POA: Diagnosis not present

## 2018-04-13 DIAGNOSIS — H409 Unspecified glaucoma: Secondary | ICD-10-CM | POA: Diagnosis not present

## 2018-04-16 DIAGNOSIS — I10 Essential (primary) hypertension: Secondary | ICD-10-CM | POA: Diagnosis not present

## 2018-04-16 DIAGNOSIS — M810 Age-related osteoporosis without current pathological fracture: Secondary | ICD-10-CM | POA: Diagnosis not present

## 2018-04-16 DIAGNOSIS — M533 Sacrococcygeal disorders, not elsewhere classified: Secondary | ICD-10-CM | POA: Diagnosis not present

## 2018-04-16 DIAGNOSIS — H409 Unspecified glaucoma: Secondary | ICD-10-CM | POA: Diagnosis not present

## 2018-04-16 DIAGNOSIS — R296 Repeated falls: Secondary | ICD-10-CM | POA: Diagnosis not present

## 2018-04-16 DIAGNOSIS — R2689 Other abnormalities of gait and mobility: Secondary | ICD-10-CM | POA: Diagnosis not present

## 2018-04-19 DIAGNOSIS — I10 Essential (primary) hypertension: Secondary | ICD-10-CM | POA: Diagnosis not present

## 2018-04-19 DIAGNOSIS — H409 Unspecified glaucoma: Secondary | ICD-10-CM | POA: Diagnosis not present

## 2018-04-19 DIAGNOSIS — M533 Sacrococcygeal disorders, not elsewhere classified: Secondary | ICD-10-CM | POA: Diagnosis not present

## 2018-04-19 DIAGNOSIS — M810 Age-related osteoporosis without current pathological fracture: Secondary | ICD-10-CM | POA: Diagnosis not present

## 2018-04-19 DIAGNOSIS — R296 Repeated falls: Secondary | ICD-10-CM | POA: Diagnosis not present

## 2018-04-19 DIAGNOSIS — R2689 Other abnormalities of gait and mobility: Secondary | ICD-10-CM | POA: Diagnosis not present

## 2018-04-28 DIAGNOSIS — H409 Unspecified glaucoma: Secondary | ICD-10-CM | POA: Diagnosis not present

## 2018-04-28 DIAGNOSIS — M810 Age-related osteoporosis without current pathological fracture: Secondary | ICD-10-CM | POA: Diagnosis not present

## 2018-04-28 DIAGNOSIS — R2689 Other abnormalities of gait and mobility: Secondary | ICD-10-CM | POA: Diagnosis not present

## 2018-04-28 DIAGNOSIS — R296 Repeated falls: Secondary | ICD-10-CM | POA: Diagnosis not present

## 2018-04-28 DIAGNOSIS — I10 Essential (primary) hypertension: Secondary | ICD-10-CM | POA: Diagnosis not present

## 2018-04-28 DIAGNOSIS — M533 Sacrococcygeal disorders, not elsewhere classified: Secondary | ICD-10-CM | POA: Diagnosis not present

## 2018-05-26 ENCOUNTER — Other Ambulatory Visit: Payer: Medicare Other

## 2018-05-26 ENCOUNTER — Ambulatory Visit: Payer: Medicare Other | Admitting: Family

## 2018-05-27 ENCOUNTER — Other Ambulatory Visit: Payer: Self-pay | Admitting: Family

## 2018-05-27 DIAGNOSIS — C9110 Chronic lymphocytic leukemia of B-cell type not having achieved remission: Secondary | ICD-10-CM

## 2018-05-28 ENCOUNTER — Inpatient Hospital Stay (HOSPITAL_BASED_OUTPATIENT_CLINIC_OR_DEPARTMENT_OTHER): Payer: Medicare Other | Admitting: Family

## 2018-05-28 ENCOUNTER — Inpatient Hospital Stay: Payer: Medicare Other | Attending: Hematology & Oncology

## 2018-05-28 VITALS — BP 140/66 | HR 80 | Temp 97.6°F | Resp 19 | Wt 132.8 lb

## 2018-05-28 DIAGNOSIS — R0609 Other forms of dyspnea: Secondary | ICD-10-CM | POA: Diagnosis not present

## 2018-05-28 DIAGNOSIS — C9110 Chronic lymphocytic leukemia of B-cell type not having achieved remission: Secondary | ICD-10-CM | POA: Insufficient documentation

## 2018-05-28 LAB — CBC WITH DIFFERENTIAL (CANCER CENTER ONLY)
Abs Immature Granulocytes: 0.02 10*3/uL (ref 0.00–0.07)
Basophils Absolute: 0.1 10*3/uL (ref 0.0–0.1)
Basophils Relative: 1 %
Eosinophils Absolute: 0.2 10*3/uL (ref 0.0–0.5)
Eosinophils Relative: 2 %
HCT: 42.1 % (ref 36.0–46.0)
Hemoglobin: 13.8 g/dL (ref 12.0–15.0)
Immature Granulocytes: 0 %
Lymphocytes Relative: 61 %
Lymphs Abs: 6.4 10*3/uL — ABNORMAL HIGH (ref 0.7–4.0)
MCH: 31.9 pg (ref 26.0–34.0)
MCHC: 32.8 g/dL (ref 30.0–36.0)
MCV: 97.2 fL (ref 80.0–100.0)
Monocytes Absolute: 0.8 10*3/uL (ref 0.1–1.0)
Monocytes Relative: 8 %
Neutro Abs: 2.9 10*3/uL (ref 1.7–7.7)
Neutrophils Relative %: 28 %
Platelet Count: 203 10*3/uL (ref 150–400)
RBC: 4.33 MIL/uL (ref 3.87–5.11)
RDW: 13.1 % (ref 11.5–15.5)
WBC Count: 10.4 10*3/uL (ref 4.0–10.5)
nRBC: 0 % (ref 0.0–0.2)

## 2018-05-28 LAB — SAVE SMEAR(SSMR), FOR PROVIDER SLIDE REVIEW

## 2018-05-28 NOTE — Progress Notes (Signed)
Hematology and Oncology Follow Up Visit  Krystal [name] [id number] 07-Mar-1926 85 y.o. 05/28/2018   Principle Diagnosis:  Stage A CLL  Current Therapy:   Observation   Interim History:  Krystal [name] is here with her daughter for follow-up. She is doing well but states that sometimes she has a little trouble getting out what she wants to say.  She had a fall in December and thankfully was not seriously injured. She is ambulating without assistance at this time.  No issue with infections. Her WBC count is stable at 11.1. No anemia and platelet count is 238.  No fever, chills, n/v, cough, rash, dizziness, chest pain, palpitations, abdominal pain or changes in bowel or bladder habits.  She has occasional SOB with exertion and will take a break to rest when needed.  No numbness or tingling in her extremeities. The puffiness in her feet and ankles is stable and unchanged.  No lymphadenopathy noted on exam.  She has maintained a good appetite and is staying well hydrated. Her weight is stable.   ECOG Performance Status: 1 - Symptomatic but completely ambulatory  Medications:  Allergies as of 05/28/2018   No Known Allergies     Medication List       Accurate as of May 28, 2018 11:59 AM. Always use your most recent med list.        amLODipine 5 MG tablet Commonly known as:  NORVASC TAKE 1 TABLET BY MOUTH ONCE DAILY   Fish Oil 1000 MG Caps Take 1,000 capsules by mouth 2 (two) times daily.   ibuprofen 200 MG tablet Commonly known as:  ADVIL,MOTRIN Take 200 mg by mouth as needed.   latanoprost 0.005 % ophthalmic solution Commonly known as:  XALATAN Place 1 drop into both eyes at bedtime.   levothyroxine 25 MCG tablet Commonly known as:  SYNTHROID, LEVOTHROID TAKE 1 TABLET BY MOUTH 1  TIME DAILY BEFORE BREAKFAST FOR THYROID   olmesartan 20 MG tablet Commonly known as:  BENICAR TAKE 1 TABLET BY MOUTH ONCE DAILY   SENIOR MULTIVITAMIN PLUS PO Take by mouth every  morning.   simvastatin 20 MG tablet Commonly known as:  ZOCOR TAKE 1 TABLET BY MOUTH ONCE DAILY FOR CHOLESTEROL   SYSTANE OP Apply to eye.   Vitamin D 50 MCG (2000 UT) Caps Take 1 capsule (2,000 Units total) by mouth daily.       Allergies: No Known Allergies  Past Medical History, Surgical history, Social history, and Family History were reviewed and updated.  Review of Systems: All other 10 point review of systems is negative.   Physical Exam:  vitals were not taken for this visit.   Wt Readings from Last 3 Encounters:  04/05/18 150 lb (68 kg)  03/02/18 147 lb (66.7 kg)  02/01/18 155 lb (70.3 kg)    Ocular: Sclerae unicteric, pupils equal, round and reactive to light Ear-nose-throat: Oropharynx clear, dentition fair Lymphatic: No cervical, supraclavicular or axillary adenopathy Lungs no rales or rhonchi, good excursion bilaterally Heart regular rate and rhythm, no murmur appreciated Abd soft, nontender, positive bowel sounds, no liver or spleen tip palpated on exam, no fluid wave  MSK no focal spinal tenderness, no joint edema Neuro: non-focal, well-oriented, appropriate affect Breasts: Deferred   Lab Results  Component Value Date   WBC 11.1 (H) 04/02/2018   HGB 14.2 04/02/2018   HCT 41.8 04/02/2018   MCV 93.1 04/02/2018   PLT 238 04/02/2018   No results found for: FERRITIN, IRON, TIBC, UIBC,  IRONPCTSAT Lab Results  Component Value Date   RBC 4.49 04/02/2018   No results found for: Nils Pyle [facility] Lab Results  Component Value Date   IGGSERUM 1,090 05/24/2014   IGA 77 05/24/2014   IGMSERUM 64 05/24/2014   Lab Results  Component Value Date   TOTALPROTELP 6.2 05/24/2014   ALBUMINELP 62.1 05/24/2014   A1GS 4.7 05/24/2014   A2GS 11.7 05/24/2014   BETS 6.8 05/24/2014   BETA2SER 4.3 05/24/2014   GAMS 10.4 (L) 05/24/2014   MSPIKE NOT DET 05/24/2014   SPEI * 05/24/2014     Chemistry      Component Value Date/Time   NA 141  04/02/2018 1129   NA 140 05/23/2015 1126   K 4.5 04/02/2018 1129   K 4.0 05/23/2015 1126   CL 104 04/02/2018 1129   CL 102 05/24/2014 1124   CO2 27 04/02/2018 1129   CO2 25 05/23/2015 1126   BUN 21 04/02/2018 1129   BUN 16.0 05/23/2015 1126   CREATININE 1.07 (H) 04/02/2018 1129   CREATININE 1.1 05/23/2015 1126      Component Value Date/Time   CALCIUM 9.7 04/02/2018 1129   CALCIUM 9.1 05/23/2015 1126   ALKPHOS 62 03/11/2016 0836   ALKPHOS 79 05/23/2015 1126   AST 20 04/02/2018 1129   AST 20 05/23/2015 1126   ALT 9 04/02/2018 1129   ALT 9 05/23/2015 1126   BILITOT 1.0 04/02/2018 1129   BILITOT 0.56 05/23/2015 1126       Impression and Plan: Krystal [name] is a very pleasant 85 yo caucasian female with stage A CLL. She continues to do well and has no complaints at this time.  Her counts remain stable.  I spoke with Dr. Marin Olp and st this point we can let her go from our practice and only follow-up as needed with any future issues.   They will contact our office with any questions or concerns. We can certainly see her again if she needs [place].   Laverna Peace, NP 3/6/202011:59 AM

## 2018-05-31 ENCOUNTER — Telehealth: Payer: Self-pay | Admitting: Family

## 2018-05-31 NOTE — Telephone Encounter (Signed)
No LOS 3/6

## 2018-06-27 ENCOUNTER — Other Ambulatory Visit: Payer: Self-pay | Admitting: Internal Medicine

## 2018-08-31 DIAGNOSIS — H26491 Other secondary cataract, right eye: Secondary | ICD-10-CM | POA: Diagnosis not present

## 2018-08-31 DIAGNOSIS — H401134 Primary open-angle glaucoma, bilateral, indeterminate stage: Secondary | ICD-10-CM | POA: Diagnosis not present

## 2018-10-07 ENCOUNTER — Ambulatory Visit: Payer: Medicare Other | Admitting: Internal Medicine

## 2018-10-07 ENCOUNTER — Other Ambulatory Visit: Payer: Medicare Other

## 2018-10-18 ENCOUNTER — Encounter: Payer: Self-pay | Admitting: Internal Medicine

## 2018-10-18 ENCOUNTER — Other Ambulatory Visit: Payer: Self-pay

## 2018-10-18 ENCOUNTER — Ambulatory Visit (INDEPENDENT_AMBULATORY_CARE_PROVIDER_SITE_OTHER): Payer: Medicare Other | Admitting: Internal Medicine

## 2018-10-18 VITALS — BP 130/70 | HR 76 | Temp 98.3°F | Ht <= 58 in | Wt 156.0 lb

## 2018-10-18 DIAGNOSIS — E039 Hypothyroidism, unspecified: Secondary | ICD-10-CM | POA: Diagnosis not present

## 2018-10-18 DIAGNOSIS — C9110 Chronic lymphocytic leukemia of B-cell type not having achieved remission: Secondary | ICD-10-CM | POA: Diagnosis not present

## 2018-10-18 DIAGNOSIS — I10 Essential (primary) hypertension: Secondary | ICD-10-CM | POA: Diagnosis not present

## 2018-10-18 DIAGNOSIS — E7800 Pure hypercholesterolemia, unspecified: Secondary | ICD-10-CM

## 2018-10-18 DIAGNOSIS — R2689 Other abnormalities of gait and mobility: Secondary | ICD-10-CM

## 2018-10-18 DIAGNOSIS — G3184 Mild cognitive impairment, so stated: Secondary | ICD-10-CM | POA: Diagnosis not present

## 2018-10-18 DIAGNOSIS — R739 Hyperglycemia, unspecified: Secondary | ICD-10-CM

## 2018-10-18 NOTE — Progress Notes (Signed)
Location:  [facility] clinic Provider:  Ramata Strothman L. Mariea Clonts, D.O., C.M.D.  Code Status: DNR Goals of Care:  Advanced Directives 04/11/2018  Does Patient Have a Medical Advance Directive? Yes  Type of Paramedic of [place];Living will  Does patient want to make changes to medical advance directive? No - Patient declined  Copy of [place] in Chart? Yes - validated most recent copy scanned in chart (See row information)  Would patient like information on creating a medical advance directive? -  Pre-existing out of facility DNR order (yellow form or pink MOST form) -   Chief Complaint  Patient presents with  . Medical Management of Chronic Issues    30mth follow-up    HPI: Patient is a 85 y.o. female seen today for medical management of chronic diseases.  Says her memory is getting worse.  She just knows it will be harder.  She's having more difficulty finding words.  Might remember 10 mins later.  Otherwise, she's good.  Uses the calendar to keep track and that works.  She reminds her daughter.    She has some chronic pain in her right leg.  She exercises a little and it goes away.  She's not needing to take any pain medication.  Sleeps well.  Appetite is good.  She got a dog--a cocker spaniel--so she has companionship.  Sees well.  No falls.    Her daughter asks about having someone to come help her mother with some day to day tasks.    Past Medical History:  Diagnosis Date  . Arthritis   . Glaucoma   . Hyperlipidemia   . Hypertension   . Osteoporosis   . Thyroid disease     Past Surgical History:  Procedure Laterality Date  . ABDOMINAL HYSTERECTOMY  1965   partial  . CHOLECYSTECTOMY  1989  . SKIN SURGERY  02/2014   [place] Cell removed from right side of face     No Known Allergies  Outpatient Encounter Medications as of 10/18/2018  Medication Sig  . amLODipine (NORVASC) 5 MG tablet TAKE 1 TABLET BY MOUTH ONCE DAILY  . Cholecalciferol  (VITAMIN D) 2000 UNITS CAPS Take 1 capsule (2,000 Units total) by mouth daily.  Marland Kitchen ibuprofen (ADVIL,MOTRIN) 200 MG tablet Take 200 mg by mouth as needed.  . latanoprost (XALATAN) 0.005 % ophthalmic solution Place 1 drop into both eyes at bedtime.  Marland Kitchen levothyroxine (SYNTHROID, LEVOTHROID) 25 MCG tablet TAKE 1 TABLET BY MOUTH 1  TIME DAILY BEFORE BREAKFAST FOR THYROID  . Multiple Vitamins-Minerals (SENIOR MULTIVITAMIN PLUS PO) Take by mouth every morning.  . olmesartan (BENICAR) 20 MG tablet TAKE 1 TABLET BY MOUTH ONCE DAILY  . Omega-3 Fatty Acids (FISH OIL) 1000 MG CAPS Take 1,000 capsules by mouth 2 (two) times daily.  Vladimir Faster Glycol-Propyl Glycol (SYSTANE OP) Apply to eye.  . simvastatin (ZOCOR) 20 MG tablet TAKE 1 TABLET BY MOUTH ONCE DAILY FOR CHOLESTEROL   No facility-administered encounter medications on file as of 10/18/2018.     Review of Systems:  Review of Systems  Constitutional: Negative for chills, fever and malaise/fatigue.       Weight gain  HENT: Positive for hearing loss.   Eyes: Negative for blurred vision.  Respiratory: Negative for cough and shortness of breath.   Cardiovascular: Positive for leg swelling (refuses compression hose, does elevate feet at rest). Negative for chest pain and palpitations.  Gastrointestinal: Negative for abdominal pain, blood in stool, constipation and  melena.  Genitourinary: Negative for dysuria.  Musculoskeletal: Positive for back pain (and right hip pain chronically). Negative for falls.  Skin: Negative for itching and rash.  Neurological: Negative for loss of consciousness and headaches.  Psychiatric/Behavioral: Positive for memory loss. Negative for depression. The patient is not nervous/anxious and does not have insomnia.        Word-finding challenges progressing, repeating self in visit now also    Health Maintenance  Topic Date Due  . INFLUENZA VACCINE  10/23/2018  . TETANUS/TDAP  03/24/2022  . DEXA SCAN  Completed  . PNA vac  Low Risk Adult  Completed    Physical Exam: Vitals:   10/18/18 1030  BP: 130/70  Pulse: 76  Temp: 98.3 F (36.8 C)  TempSrc: Oral  SpO2: 97%  Weight: 156 lb (70.8 kg)  Height: 4\' 9"  (1.448 m)   Body mass index is 33.76 kg/m. Physical Exam Vitals signs reviewed.  Constitutional:      General: She is not in acute distress.    Appearance: Normal appearance. She is not ill-appearing or toxic-appearing.  HENT:     Head: Normocephalic and atraumatic.  Eyes:     Comments: glasses  Cardiovascular:     Rate and Rhythm: Normal rate and regular rhythm.     Pulses: Normal pulses.     Heart sounds: Normal heart sounds.  Pulmonary:     Effort: Pulmonary effort is normal.     Breath sounds: Normal breath sounds. No wheezing, rhonchi or rales.  Abdominal:     General: Bowel sounds are normal. There is no distension.     Palpations: Abdomen is soft. There is no mass.     Tenderness: There is no abdominal tenderness.  Musculoskeletal: Normal range of motion.     Right lower leg: Edema present.     Left lower leg: Edema present.     Comments: Nonpitting in both lower legs and ankles  Skin:    General: Skin is warm and dry.  Neurological:     General: No focal deficit present.     Mental Status: She is alert and oriented to person, place, and time.     Comments: But repeating self and having difficulty with word-finding  Psychiatric:        Mood and Affect: Mood normal.        Behavior: Behavior normal.     Labs reviewed: Basic Metabolic Panel: Recent Labs    03/02/18 1243 04/02/18 1129 04/02/18 1133  NA 137 141  --   K 3.8 4.5  --   CL 105 104  --   CO2 23 27  --   GLUCOSE 124* 104*  --   BUN 19 21  --   CREATININE 1.17* 1.07*  --   CALCIUM 9.3 9.7  --   TSH  --   --  4.09   Liver Function Tests: Recent Labs    04/02/18 1129  AST 20  ALT 9  BILITOT 1.0  PROT 6.5   No results for input(s): LIPASE, AMYLASE in the last 8760 hours. No results for input(s):  AMMONIA in the last 8760 hours. CBC: Recent Labs    03/02/18 1243 04/02/18 1134 05/28/18 1134  WBC 15.7* 11.1* 10.4  NEUTROABS  --  3,319 2.9  HGB 14.2 14.2 13.8  HCT 43.7 41.8 42.1  MCV 96.0 93.1 97.2  PLT 245 238 203   Lipid Panel: Recent Labs    04/02/18 1130  CHOL 156  HDL  47  LDLCALC 81  TRIG 76  CHOLHDL 2.6   Lab Results  Component Value Date   HGBA1C 5.5 03/11/2016    Procedures since last visit: No results found.  Assessment/Plan 1. Essential hypertension, benign -bp is well controlled, cont same regimen, follow up lab before next visit: COMPLETE METABOLIC PANEL WITH GFR; Future  2. Mild cognitive impairment with memory loss - having more difficulty--seems she may have a primary progressive aphasia variation of dementia vs an AD with word-finding primary issue - COMPLETE METABOLIC PANEL WITH GFR; Future -discussed some increased need for help in her home, now takes care of dog, also -her daughter is going to call some of the home care agencies  3. Acquired hypothyroidism -cont levothyroxine current dose, f/u before next visit - COMPLETE METABOLIC PANEL WITH GFR; Future - TSH; Future  4. Chronic lymphocytic leukemia ([place]) - no longer followed by hematology, will check cbc and smear myself before next visit - CBC with Differential/Platelet; Future - COMPLETE METABOLIC PANEL WITH GFR; Future - Pathologist smear review; Future  5. Hyperglycemia -expect this may worsen as she's gained weight -f/u lab before next visit - COMPLETE METABOLIC PANEL WITH GFR; Future - Hemoglobin A1c; Future  6. Balance problem -ongoing, uses cane primarily when out--forgot it today at home - [place] GFR; Future  7. Pure hypercholesterolemia -cont current regimen and monitor - COMPLETE METABOLIC PANEL WITH GFR; Future - Lipid panel; Future    Labs/tests ordered:   Lab Orders     CBC with Differential/Platelet     COMPLETE METABOLIC PANEL WITH  GFR     Lipid panel     Hemoglobin A1c     TSH     Pathologist smear review  Next appt:  02/04/2019   Shyanna Klingel L. Dorianna Mckiver, D.O. [facility] [address], [place] [address] Cell Phone (Mon-Fri 8am-5pm):  [phone] On Call:  [phone] & follow prompts after 5pm & weekends Office Phone:  [phone] Office Fax:  [phone]

## 2018-11-01 ENCOUNTER — Other Ambulatory Visit: Payer: Self-pay | Admitting: Internal Medicine

## 2019-02-04 ENCOUNTER — Ambulatory Visit (INDEPENDENT_AMBULATORY_CARE_PROVIDER_SITE_OTHER): Payer: Medicare Other | Admitting: Nurse Practitioner

## 2019-02-04 ENCOUNTER — Encounter: Payer: Self-pay | Admitting: Nurse Practitioner

## 2019-02-04 ENCOUNTER — Ambulatory Visit: Payer: Self-pay

## 2019-02-04 ENCOUNTER — Other Ambulatory Visit: Payer: Self-pay

## 2019-02-04 VITALS — BP 148/80 | HR 70 | Temp 97.7°F | Ht 60.0 in | Wt 155.0 lb

## 2019-02-04 DIAGNOSIS — Z0000 Encounter for general adult medical examination without abnormal findings: Secondary | ICD-10-CM | POA: Diagnosis not present

## 2019-02-04 DIAGNOSIS — E2839 Other primary ovarian failure: Secondary | ICD-10-CM | POA: Diagnosis not present

## 2019-02-04 DIAGNOSIS — Z23 Encounter for immunization: Secondary | ICD-10-CM

## 2019-02-04 NOTE — Patient Instructions (Signed)
Krystal [name] , Thank you for taking time to come for your Medicare Wellness Visit. I appreciate your ongoing commitment to your health goals. Please review the following plan we discussed and let me know if I can assist you in the future.   Screening recommendations/referrals: Colonoscopy aged out Mammogram aged out Bone Density due Recommended yearly ophthalmology/optometry visit for glaucoma screening and checkup Recommended yearly dental visit for hygiene and checkup  Vaccinations: Influenza vaccine GIVEN today Pneumococcal vaccine up to date Tdap vaccine up to date Shingles vaccine recommended to get at local pharamcy    Advanced directives: on file  Conditions/risks identified: risk for weakness and loss of muscle due to decrease activity, encourage to increase activity.   Next appointment: 1 year    Preventive Care 83 Years and Older, Female Preventive care refers to lifestyle choices and visits with your health care provider that can promote health and wellness. What does preventive care include?  A yearly physical exam. This is also called an annual well check.  Dental exams once or twice a year.  Routine eye exams. Ask your health care provider how often you should have your eyes checked.  Personal lifestyle choices, including:  Daily care of your teeth and gums.  Regular physical activity.  Eating a healthy diet.  Avoiding tobacco and drug use.  Limiting alcohol use.  Practicing safe sex.  Taking low-dose aspirin every day.  Taking vitamin and mineral supplements as recommended by your health care provider. What happens during an annual well check? The services and screenings done by your health care provider during your annual well check will depend on your age, overall health, lifestyle risk factors, and family history of disease. Counseling  Your health care provider may ask you questions about your:  Alcohol use.  Tobacco use.  Drug use.   Emotional well-being.  Home and relationship well-being.  Sexual activity.  Eating habits.  History of falls.  Memory and ability to understand (cognition).  Work and work Statistician.  Reproductive health. Screening  You may have the following tests or measurements:  Height, weight, and BMI.  Blood pressure.  Lipid and cholesterol levels. These may be checked every 5 years, or more frequently if you are over 83 years old.  Skin check.  Lung cancer screening. You may have this screening every year starting at age 83 if you have a 30-pack-year history of smoking and currently smoke or have quit within the past 15 years.  Fecal occult blood test (FOBT) of the stool. You may have this test every year starting at age 83.  Flexible sigmoidoscopy or colonoscopy. You may have a sigmoidoscopy every 5 years or a colonoscopy every 10 years starting at age 83.  Hepatitis C blood test.  Hepatitis B blood test.  Sexually transmitted disease (STD) testing.  Diabetes screening. This is done by checking your blood sugar (glucose) after you have not eaten for a while (fasting). You may have this done every 1-3 years.  Bone density scan. This is done to screen for osteoporosis. You may have this done starting at age 83.  Mammogram. This may be done every 1-2 years. Talk to your health care provider about how often you should have regular mammograms. Talk with your health care provider about your test results, treatment options, and if necessary, the need for more tests. Vaccines  Your health care provider may recommend certain vaccines, such as:  Influenza vaccine. This is recommended every year.  Tetanus, diphtheria, and acellular  pertussis (Tdap, Td) vaccine. You may need a Td booster every 10 years.  Zoster vaccine. You may need this after age 83.  Pneumococcal 13-valent conjugate (PCV13) vaccine. One dose is recommended after age 83.  Pneumococcal polysaccharide (PPSV23)  vaccine. One dose is recommended after age 83. Talk to your health care provider about which screenings and vaccines you need and how often you need them. This information is not intended to replace advice given to you by your health care provider. Make sure you discuss any questions you have with your health care provider. Document Released: 04/06/2015 Document Revised: 11/28/2015 Document Reviewed: 01/09/2015 Elsevier Interactive Patient Education  2017 [employer] Prevention in the Home Falls can cause injuries. They can happen to people of all ages. There are many things you can do to make your home safe and to help prevent falls. What can I do on the outside of my home?  Regularly fix the edges of walkways and driveways and fix any cracks.  Remove anything that might make you trip as you walk through a door, such as a raised step or threshold.  Trim any bushes or trees on the path to your home.  Use bright outdoor lighting.  Clear any walking paths of anything that might make someone trip, such as rocks or tools.  Regularly check to see if handrails are loose or broken. Make sure that both sides of any steps have handrails.  Any raised decks and porches should have guardrails on the edges.  Have any leaves, snow, or ice cleared regularly.  Use sand or salt on walking paths during winter.  Clean up any spills in your garage right away. This includes oil or grease spills. What can I do in the bathroom?  Use night lights.  Install grab bars by the toilet and in the tub and shower. Do not use towel bars as grab bars.  Use non-skid mats or decals in the tub or shower.  If you need to sit down in the shower, use a plastic, non-slip stool.  Keep the floor dry. Clean up any water that spills on the floor as soon as it happens.  Remove soap buildup in the tub or shower regularly.  Attach bath mats securely with double-sided non-slip rug tape.  Do not have throw rugs  and other things on the floor that can make you trip. What can I do in the bedroom?  Use night lights.  Make sure that you have a light by your bed that is easy to reach.  Do not use any sheets or blankets that are too big for your bed. They should not hang down onto the floor.  Have a firm chair that has side arms. You can use this for support while you get dressed.  Do not have throw rugs and other things on the floor that can make you trip. What can I do in the kitchen?  Clean up any spills right away.  Avoid walking on wet floors.  Keep items that you use a lot in easy-to-reach places.  If you need to reach something above you, use a strong step stool that has a grab bar.  Keep electrical cords out of the way.  Do not use floor polish or wax that makes floors slippery. If you must use wax, use non-skid floor wax.  Do not have throw rugs and other things on the floor that can make you trip. What can I do with my stairs?  Do not leave any items on the stairs.  Make sure that there are handrails on both sides of the stairs and use them. Fix handrails that are broken or loose. Make sure that handrails are as long as the stairways.  Check any carpeting to make sure that it is firmly attached to the stairs. Fix any carpet that is loose or worn.  Avoid having throw rugs at the top or bottom of the stairs. If you do have throw rugs, attach them to the floor with carpet tape.  Make sure that you have a light switch at the top of the stairs and the bottom of the stairs. If you do not have them, ask someone to add them for you. What else can I do to help prevent falls?  Wear shoes that:  Do not have high heels.  Have rubber bottoms.  Are comfortable and fit you well.  Are closed at the toe. Do not wear sandals.  If you use a stepladder:  Make sure that it is fully opened. Do not climb a closed stepladder.  Make sure that both sides of the stepladder are locked into  place.  Ask someone to hold it for you, if possible.  Clearly mark and make sure that you can see:  Any grab bars or handrails.  First and last steps.  Where the edge of each step is.  Use tools that help you move around (mobility aids) if they are needed. These include:  Canes.  Walkers.  Scooters.  Crutches.  Turn on the lights when you go into a dark area. Replace any light bulbs as soon as they burn out.  Set up your furniture so you have a clear path. Avoid moving your furniture around.  If any of your floors are uneven, fix them.  If there are any pets around you, be aware of where they are.  Review your medicines with your doctor. Some medicines can make you feel dizzy. This can increase your chance of falling. Ask your doctor what other things that you can do to help prevent falls. This information is not intended to replace advice given to you by your health care provider. Make sure you discuss any questions you have with your health care provider. Document Released: 01/04/2009 Document Revised: 08/16/2015 Document Reviewed: 04/14/2014 Elsevier Interactive Patient Education  2017 [employer].

## 2019-02-04 NOTE — Addendum Note (Signed)
Addended by: Ruthell Rummage A on: 02/04/2019 12:14 PM   Modules accepted: Orders

## 2019-02-04 NOTE — Progress Notes (Signed)
Subjective:   Krystal [name] is a 85 y.o. female who presents for Medicare Annual (Subsequent) preventive examination.  Review of Systems:   Cardiac Risk Factors include: advanced age (>27men, >3 women);hypertension;dyslipidemia;obesity (BMI >30kg/m2);sedentary lifestyle     Objective:     Vitals: BP (!) 148/80 (BP Location: Right Arm, Patient Position: Sitting, Cuff Size: Normal)   Pulse 70   Temp 97.7 F (36.5 C) (Temporal)   Ht 5' (1.524 m) Comment: With shoes on  Wt 155 lb (70.3 kg)   SpO2 96%   BMI 30.27 kg/m   Body mass index is 30.27 kg/m.  Advanced Directives 02/04/2019 04/11/2018 03/02/2018 09/29/2016 09/26/2016 03/13/2016 09/10/2015  Does Patient Have a Medical Advance Directive? Yes Yes No Yes Yes Yes Yes  Type of Paramedic of [place];Living will [place];Living will - [place];Living will Living will;Healthcare Power of Attorney Living will;Healthcare Power of [place];Living will  Does patient want to make changes to medical advance directive? No - Patient declined No - Patient declined - - No - Patient declined - -  Copy of [place] in Chart? Yes - validated most recent copy scanned in chart (See row information) Yes - validated most recent copy scanned in chart (See row information) - Yes Yes Yes Yes  Would patient like information on creating a medical advance directive? - - No - Patient declined - - - -  Pre-existing out of facility DNR order (yellow form or pink MOST form) - - - - - - -    Tobacco Social History   Tobacco Use  Smoking Status Never Smoker  Smokeless Tobacco Never Used  Tobacco Comment   never used tobacco     Counseling given: Not Answered Comment: never used tobacco   Clinical Intake:  Pre-visit preparation completed: Yes  Pain : No/denies pain     BMI - recorded: 30.27 Nutritional Status: BMI > 30  Obese  Nutritional Risks: None  How often do you need to have someone help you when you read instructions, pamphlets, or other written materials from your doctor or pharmacy?: 1 - Never What is the last grade level you completed in school?: 12th grade  Interpreter Needed?: No     Past Medical History:  Diagnosis Date  . Arthritis   . Glaucoma   . Hyperlipidemia   . Hypertension   . Osteoporosis   . Thyroid disease    Past Surgical History:  Procedure Laterality Date  . ABDOMINAL HYSTERECTOMY  1965   partial  . CHOLECYSTECTOMY  1989  . SKIN SURGERY  02/2014   Basel Cell removed from right side of face    Family History  Problem Relation Age of Onset  . Cancer Mother   . Cancer Father        lung  . Heart disease Father    Social History   Socioeconomic History  . Marital status: Widowed    Spouse name: Not on file  . Number of children: Not on file  . Years of education: Not on file  . Highest education level: Not on file  Occupational History  . Not on file  Social Needs  . Financial resource strain: Not hard at all  . Food insecurity    Worry: Never true    Inability: Never true  . Transportation needs    Medical: No    Non-medical: No  Tobacco Use  . Smoking status: Never  Smoker  . Smokeless tobacco: Never Used  . Tobacco comment: never used tobacco  Substance and Sexual Activity  . Alcohol use: No    Alcohol/week: 0.0 standard drinks  . Drug use: No  . Sexual activity: Not on file  Lifestyle  . Physical activity    Days per week: 0 days    Minutes per session: 0 min  . Stress: Not at all  Relationships  . Social connections    Talks on phone: More than three times a week    Gets together: More than three times a week    Attends religious service: Never    Active member of club or organization: No    Attends meetings of clubs or organizations: Never    Relationship status: Widowed  Other Topics Concern  . Not on file  Social History Narrative    Caffeine: Coffee   Widow, married in [place]   Lives in house, 1 stories, one person, one dog   Current/past profession: Network engineer   Exercise: No   Living Will: Yes   DNR: No, would not like to discuss               Outpatient Encounter Medications as of 02/04/2019  Medication Sig  . amLODipine (NORVASC) 5 MG tablet TAKE 1 TABLET BY MOUTH ONCE DAILY  . Cholecalciferol (VITAMIN D) 2000 UNITS CAPS Take 1 capsule (2,000 Units total) by mouth daily.  Marland Kitchen ibuprofen (ADVIL,MOTRIN) 200 MG tablet Take 200 mg by mouth as needed.  . latanoprost (XALATAN) 0.005 % ophthalmic solution Place 1 drop into both eyes at bedtime.  Marland Kitchen levothyroxine (SYNTHROID) 25 MCG tablet TAKE 1 TABLET BY MOUTH ONCE DAILY BEFORE BREAKFAST FOR  THYROID  . Multiple Vitamins-Minerals (SENIOR MULTIVITAMIN PLUS PO) Take by mouth every morning.  . olmesartan (BENICAR) 20 MG tablet TAKE 1 TABLET BY MOUTH ONCE DAILY  . Omega-3 Fatty Acids (FISH OIL) 1000 MG CAPS Take 1,000 capsules by mouth 2 (two) times daily.  Vladimir Faster Glycol-Propyl Glycol (SYSTANE OP) Apply to eye.  . simvastatin (ZOCOR) 20 MG tablet TAKE 1 TABLET BY MOUTH ONCE DAILY FOR CHOLESTEROL   No facility-administered encounter medications on file as of 02/04/2019.     Activities of Daily Living In your present state of health, do you have any difficulty performing the following activities: 02/04/2019  Hearing? Y  Vision? N  Difficulty concentrating or making decisions? N  Walking or climbing stairs? Y  Comment some difficult alone  Dressing or bathing? N  Doing errands, shopping? Y  Comment daughter comes with her  Preparing Food and eating ? N  Using the Toilet? N  In the past six months, have you accidently leaked urine? N  Do you have problems with loss of bowel control? N  Managing your Medications? N  Managing your Finances? N  Housekeeping or managing your Housekeeping? N  Some recent data might be hidden    Patient Care Team: Gayland Curry,  DO as PCP - General (Geriatric Medicine) [name], MD as Consulting Physician (Oncology) [name], MD as Consulting Physician (Ophthalmology)    Assessment:   This is a routine wellness examination for Krystal [name].  Exercise Activities and Dietary recommendations Current Exercise Habits: The patient does not participate in regular exercise at present, Exercise limited by: Other - see comments(lack of motivation)  Goals    . Eat more fruits and vegetables     Patient will start increasing fruits and vegetables and protein in diet.    Marland Kitchen  Patient Stated     Patient will increase water to 4 cups a day       Fall Risk Fall Risk  02/04/2019 10/18/2018 04/05/2018 02/01/2018 10/01/2017  Falls in the past year? 1 0 0 1 Yes  Number falls in past yr: 0 0 0 1 1  Injury with Fall? 0 0 0 1 No  Comment - - - bruise L leg -  Risk for fall due to : - - - - -   Is the patient's home free of loose throw rugs in walkways, pet beds, electrical cords, etc?   yes      Grab bars in the bathroom? yes      Handrails on the stairs?   no stairs      Adequate lighting?   yes  Timed Get Up and Go performed: 3 secs   Depression Screen PHQ 2/9 Scores 02/04/2019 10/18/2018 04/05/2018 02/01/2018  PHQ - 2 Score 0 0 0 0     Cognitive Function MMSE - Mini Mental State Exam 04/05/2018 02/01/2018 09/26/2016 09/10/2015 07/31/2014  Orientation to time 5 5 5 5 4   Orientation to Place 4 4 4 4 4   Registration 3 3 3 3 3   Attention/ Calculation 5 5 5 5 5   Recall 3 3 2 3 1   Language- name 2 objects 2 2 2 2 2   Language- repeat 0 1 1 1 1   Language- follow 3 step command 3 3 3 3 3   Language- read & follow direction 1 1 1 1 1   Write a sentence 1 1 1 1 1   Copy design 1 1 1 1  0  Total score 28 29 28 29 25         Immunization History  Administered Date(s) Administered  . Influenza,inj,Quad PF,6+ Mos 02/02/2015  . Influenza-Unspecified 01/02/2014, 01/11/2016  . Pneumococcal Conjugate-13 04/24/2014  .  Pneumococcal-Unspecified 03/15/2013  . Tdap 03/24/2012    Qualifies for Shingles Vaccine?yes  Screening Tests Health Maintenance  Topic Date Due  . INFLUENZA VACCINE  10/23/2018  . TETANUS/TDAP  03/24/2022  . DEXA SCAN  Completed  . PNA vac Low Risk Adult  Completed    Cancer Screenings: Lung: Low Dose CT Chest recommended if Age 41-80 years, 30 pack-year currently smoking OR have quit w/in 15years. Patient does not qualify. Breast:  Up to date on Mammogram? Aged out Up to date of Bone Density/Dexa? No Colorectal: aged out  Additional Screenings: na Hepatitis C Screening:      Plan:      I have personally reviewed and noted the following in the patient's chart:   . Medical and social history . Use of alcohol, tobacco or illicit drugs  . Current medications and supplements . Functional ability and status . Nutritional status . Physical activity . Advanced directives . List of other physicians . Hospitalizations, surgeries, and ER visits in previous 12 months . Vitals . Screenings to include cognitive, depression, and falls . Referrals and appointments  In addition, I have reviewed and discussed with patient certain preventive protocols, quality metrics, and best practice recommendations. A written personalized care plan for preventive services as well as general preventive health recommendations were provided to patient.     Lauree Chandler, NP  02/04/2019

## 2019-03-04 ENCOUNTER — Ambulatory Visit
Admission: RE | Admit: 2019-03-04 | Discharge: 2019-03-04 | Disposition: A | Discharge status: Home / Self Care | Payer: Medicare Other | Source: Ambulatory Visit | Attending: Nurse Practitioner | Admitting: Nurse Practitioner

## 2019-03-04 ENCOUNTER — Other Ambulatory Visit: Payer: Self-pay

## 2019-03-04 DIAGNOSIS — Z0000 Encounter for general adult medical examination without abnormal findings: Secondary | ICD-10-CM

## 2019-03-04 DIAGNOSIS — E2839 Other primary ovarian failure: Secondary | ICD-10-CM

## 2019-04-11 ENCOUNTER — Other Ambulatory Visit: Payer: Self-pay

## 2019-04-11 ENCOUNTER — Other Ambulatory Visit: Payer: Medicare Other

## 2019-04-11 DIAGNOSIS — G3184 Mild cognitive impairment, so stated: Secondary | ICD-10-CM | POA: Diagnosis not present

## 2019-04-11 DIAGNOSIS — E7800 Pure hypercholesterolemia, unspecified: Secondary | ICD-10-CM | POA: Diagnosis not present

## 2019-04-11 DIAGNOSIS — I10 Essential (primary) hypertension: Secondary | ICD-10-CM

## 2019-04-11 DIAGNOSIS — C9110 Chronic lymphocytic leukemia of B-cell type not having achieved remission: Secondary | ICD-10-CM

## 2019-04-11 DIAGNOSIS — R2689 Other abnormalities of gait and mobility: Secondary | ICD-10-CM

## 2019-04-11 DIAGNOSIS — R739 Hyperglycemia, unspecified: Secondary | ICD-10-CM

## 2019-04-11 DIAGNOSIS — E039 Hypothyroidism, unspecified: Secondary | ICD-10-CM

## 2019-04-11 NOTE — Progress Notes (Signed)
All labs are stable.  Cholesterol is now at goal.  White blood cells remain around the same.  Kidneys unchanged.

## 2019-04-12 LAB — CBC WITH DIFFERENTIAL/PLATELET
Absolute Monocytes: 872 cells/uL (ref 200–950)
Basophils Absolute: 55 cells/uL (ref 0–200)
Basophils Relative: 0.5 %
Eosinophils Absolute: 131 cells/uL (ref 15–500)
Eosinophils Relative: 1.2 %
HCT: 40.7 % (ref 35.0–45.0)
Hemoglobin: 14 g/dL (ref 11.7–15.5)
Lymphs Abs: 6137 cells/uL — ABNORMAL HIGH (ref 850–3900)
MCH: 32.6 pg (ref 27.0–33.0)
MCHC: 34.4 g/dL (ref 32.0–36.0)
MCV: 94.9 fL (ref 80.0–100.0)
MPV: 10.3 fL (ref 7.5–12.5)
Monocytes Relative: 8 %
Neutro Abs: 3706 cells/uL (ref 1500–7800)
Neutrophils Relative %: 34 %
Platelets: 202 10*3/uL (ref 140–400)
RBC: 4.29 10*6/uL (ref 3.80–5.10)
RDW: 12.5 % (ref 11.0–15.0)
Total Lymphocyte: 56.3 %
WBC: 10.9 10*3/uL — ABNORMAL HIGH (ref 3.8–10.8)

## 2019-04-12 LAB — LIPID PANEL
Cholesterol: 138 mg/dL (ref <200)
HDL: 60 mg/dL (ref >=50)
LDL Cholesterol (Calc): 63 mg/dL (calc)
Non-HDL Cholesterol (Calc): 78 mg/dL (calc) (ref <130)
Total CHOL/HDL Ratio: 2.3 (calc) (ref <5.0)
Triglycerides: 71 mg/dL (ref <150)

## 2019-04-12 LAB — TSH: TSH: 3.22 mIU/L (ref 0.40–4.50)

## 2019-04-12 LAB — COMPLETE METABOLIC PANEL WITH GFR
AG Ratio: 2.3 (calc) (ref 1.0–2.5)
ALT: 11 U/L (ref 6–29)
AST: 21 U/L (ref 10–35)
Albumin: 4.3 g/dL (ref 3.6–5.1)
Alkaline phosphatase (APISO): 55 U/L (ref 37–153)
BUN/Creatinine Ratio: 23 (calc) — ABNORMAL HIGH (ref 6–22)
BUN: 24 mg/dL (ref 7–25)
CO2: 28 mmol/L (ref 20–32)
Calcium: 9.6 mg/dL (ref 8.6–10.4)
Chloride: 104 mmol/L (ref 98–110)
Creat: 1.03 mg/dL — ABNORMAL HIGH (ref 0.60–0.88)
GFR, Est African American: 55 mL/min/{1.73_m2} — ABNORMAL LOW (ref >=60)
GFR, Est Non African American: 47 mL/min/{1.73_m2} — ABNORMAL LOW (ref >=60)
Globulin: 1.9 g/dL (calc) (ref 1.9–3.7)
Glucose, Bld: 104 mg/dL — ABNORMAL HIGH (ref 65–99)
Potassium: 4.4 mmol/L (ref 3.5–5.3)
Sodium: 140 mmol/L (ref 135–146)
Total Bilirubin: 0.7 mg/dL (ref 0.2–1.2)
Total Protein: 6.2 g/dL (ref 6.1–8.1)

## 2019-04-12 LAB — HEMOGLOBIN A1C
Hgb A1c MFr Bld: 5.6 % of total Hgb (ref <5.7)
Mean Plasma Glucose: 114 (calc)
eAG (mmol/L): 6.3 (calc)

## 2019-04-12 LAB — PATHOLOGIST SMEAR REVIEW

## 2019-04-13 ENCOUNTER — Other Ambulatory Visit: Payer: Medicare Other

## 2019-04-14 ENCOUNTER — Other Ambulatory Visit: Payer: Self-pay

## 2019-04-14 ENCOUNTER — Encounter: Payer: Self-pay | Admitting: Internal Medicine

## 2019-04-14 ENCOUNTER — Ambulatory Visit (INDEPENDENT_AMBULATORY_CARE_PROVIDER_SITE_OTHER): Payer: Medicare Other | Admitting: Internal Medicine

## 2019-04-14 VITALS — BP 148/78 | HR 75 | Temp 96.2°F | Ht 61.0 in | Wt 149.9 lb

## 2019-04-14 DIAGNOSIS — R739 Hyperglycemia, unspecified: Secondary | ICD-10-CM | POA: Diagnosis not present

## 2019-04-14 DIAGNOSIS — C9110 Chronic lymphocytic leukemia of B-cell type not having achieved remission: Secondary | ICD-10-CM

## 2019-04-14 DIAGNOSIS — E7800 Pure hypercholesterolemia, unspecified: Secondary | ICD-10-CM | POA: Diagnosis not present

## 2019-04-14 DIAGNOSIS — E039 Hypothyroidism, unspecified: Secondary | ICD-10-CM | POA: Diagnosis not present

## 2019-04-14 DIAGNOSIS — G3184 Mild cognitive impairment, so stated: Secondary | ICD-10-CM

## 2019-04-14 DIAGNOSIS — I10 Essential (primary) hypertension: Secondary | ICD-10-CM | POA: Diagnosis not present

## 2019-04-14 NOTE — Progress Notes (Signed)
Location:  [facility]  Provider: Dr. Hollace Kinnier  Goals of Care:  Advanced Directives 04/14/2019  Does Patient Have a Medical Advance Directive? Yes  Type of Paramedic of [place];Living will  Does patient want to make changes to medical advance directive? No - Patient declined  Copy of [place] in Chart? Yes - validated most recent copy scanned in chart (See row information)  Would patient like information on creating a medical advance directive? -  Pre-existing out of facility DNR order (yellow form or pink MOST form) -     Chief Complaint  Patient presents with  . Medical Management of Chronic Issues    6 month follow up and lab results     HPI: Patient is a 86 y.o. female seen today for medical management of chronic diseases.    Daughter with patient.   Labs reviewed with patient.   Received her first covid vaccine January 14th, and will have second February 4th. She has had no complaints after vaccine.   Does not adhere to a particular diet. Has about 2-3 meals a day. Only drinking water with medications in the AM and PM. She understands she is not drinking enough. Daughter asking for ways to encourage hydration.   Will use a cane when she leave the house, but not at home. No recent falls or injuries. She has a new complaint of right knee pain. The pain started a few weeks ago. Pain is everyday. Knee hurts more when getting up from sitting position. She has not taken any medication for the pain. Resting is the only thing that alleviates pain. Asking for interventions to help with pain.   Last dental visit 03/2019   Past Medical History:  Diagnosis Date  . Arthritis   . Glaucoma   . Hyperlipidemia   . Hypertension   . Osteoporosis   . Thyroid disease     Past Surgical History:  Procedure Laterality Date  . ABDOMINAL HYSTERECTOMY  1965   partial  . CHOLECYSTECTOMY  1989  . SKIN SURGERY  02/2014   Basel Cell  removed from right side of face     No Known Allergies  Outpatient Encounter Medications as of 04/14/2019  Medication Sig  . amLODipine (NORVASC) 5 MG tablet TAKE 1 TABLET BY MOUTH ONCE DAILY  . Calcium Carbonate-Vit D-Min (CALCIUM 1200 PO) Take 1 tablet by mouth 2 (two) times daily.   . Cholecalciferol (VITAMIN D) 2000 UNITS CAPS Take 1 capsule (2,000 Units total) by mouth daily.  Marland Kitchen ibuprofen (ADVIL,MOTRIN) 200 MG tablet Take 200 mg by mouth as needed.  . latanoprost (XALATAN) 0.005 % ophthalmic solution Place 1 drop into both eyes at bedtime.  Marland Kitchen levothyroxine (SYNTHROID) 25 MCG tablet TAKE 1 TABLET BY MOUTH ONCE DAILY BEFORE BREAKFAST FOR  THYROID  . Multiple Vitamins-Minerals (SENIOR MULTIVITAMIN PLUS PO) Take by mouth every morning.  . olmesartan (BENICAR) 20 MG tablet TAKE 1 TABLET BY MOUTH ONCE DAILY  . Omega-3 Fatty Acids (FISH OIL) 1000 MG CAPS Take 1,000 capsules by mouth 2 (two) times daily.  Vladimir Faster Glycol-Propyl Glycol (SYSTANE OP) Apply to eye.  . simvastatin (ZOCOR) 20 MG tablet TAKE 1 TABLET BY MOUTH ONCE DAILY FOR CHOLESTEROL   No facility-administered encounter medications on file as of 04/14/2019.    Review of Systems:  Review of Systems  Constitutional: Negative for activity change, appetite change and fever.  HENT: Negative for dental problem, hearing loss and trouble swallowing.  Eyes: Negative for photophobia and visual disturbance.       Glasses  Respiratory: Negative for cough and shortness of breath.   Cardiovascular: Positive for leg swelling. Negative for chest pain.  Gastrointestinal: Negative for abdominal pain, constipation, diarrhea and nausea.  Endocrine: Negative for polydipsia, polyphagia and polyuria.  Genitourinary: Negative for dysuria, frequency and hematuria.  Musculoskeletal:       Right knee pain  Skin: Negative.   Neurological: Negative for dizziness, weakness, light-headedness and headaches.  Psychiatric/Behavioral: Negative for  dysphoric mood and sleep disturbance. The patient is not nervous/anxious.   All other systems reviewed and are negative.   Health Maintenance  Topic Date Due  . TETANUS/TDAP  03/24/2022  . INFLUENZA VACCINE  Completed  . DEXA SCAN  Completed  . PNA vac Low Risk Adult  Completed    Physical Exam: Vitals:   04/14/19 1039  BP: (!) 148/78  Pulse: 75  Temp: (!) 96.2 F (35.7 C)  TempSrc: Temporal  SpO2: (!) 75%  Weight: 149 lb 14.4 oz (68 kg)  Height: 5\' 1"  (1.549 m)   Body mass index is 28.32 kg/m. Physical Exam Vitals reviewed.  Constitutional:      General: She is not in acute distress.    Appearance: Normal appearance. She is normal weight.  Cardiovascular:     Rate and Rhythm: Normal rate and regular rhythm.     Pulses: Normal pulses.     Heart sounds: Normal heart sounds. No murmur.  Pulmonary:     Effort: Pulmonary effort is normal. No respiratory distress.     Breath sounds: Normal breath sounds. No wheezing.  Abdominal:     General: Abdomen is flat. Bowel sounds are normal.     Palpations: Abdomen is soft.  Musculoskeletal:     Right lower leg: Edema present.     Left lower leg: Edema present.       Legs:  Skin:    General: Skin is warm and dry.     Capillary Refill: Capillary refill takes less than 2 seconds.  Neurological:     General: No focal deficit present.     Mental Status: She is alert and oriented to person, place, and time. Mental status is at baseline.  Psychiatric:        Mood and Affect: Mood normal.        Behavior: Behavior normal.        Thought Content: Thought content normal.        Judgment: Judgment normal.     Labs reviewed: Basic Metabolic Panel: Recent Labs    04/11/19 1045  NA 140  K 4.4  CL 104  CO2 28  GLUCOSE 104*  BUN 24  CREATININE 1.03*  CALCIUM 9.6  TSH 3.22   Liver Function Tests: Recent Labs    04/11/19 1045  AST 21  ALT 11  BILITOT 0.7  PROT 6.2   No results for input(s): LIPASE, AMYLASE in the  last 8760 hours. No results for input(s): AMMONIA in the last 8760 hours. CBC: Recent Labs    05/28/18 1134 04/11/19 1045  WBC 10.4 10.9*  NEUTROABS 2.9 3,706  HGB 13.8 14.0  HCT 42.1 40.7  MCV 97.2 94.9  PLT 203 202   Lipid Panel: Recent Labs    04/11/19 1045  CHOL 138  HDL 60  LDLCALC 63  TRIG 71  CHOLHDL 2.3   Lab Results  Component Value Date   HGBA1C 5.6 04/11/2019    Procedures since  last visit: No results found.  Assessment/Plan 1. Essential hypertension, benign - bp at goal, <150/90 - continue current bp medications - recommend limiting sodium in diet  2. Chronic lymphocytic leukemia ([place]) - no longer followed by hematology, but follow cbc, she is symptomatic at this time - WBC still slightly elevated - CBC with differential/platelets- future - complete metabolic panel with GFR- future  3. Mild cognitive impairment with memory loss - she has strong family support and help of home care agency when needed - her issues with aphasia or word-finding have remained the same and have not progressed - complete metabolic panel with GFR- future  4. Acquired hypothyroidism - TSH within normal range, she is asymptomatic - continue current levothyroxine 25 mcg daily in AM - TSH- future  5. Hyperglycemia - has lost 7 pounds since last visit - continue to limit foods high in sugar and carbs - hemoglobin A1C- future  6. Pure hypercholesterolemia - LDL at goal <100 - she denies changing diet  - continue current statin therapy - limit fried foods and food high in fat  Labs/tests ordered:  cbc with differential/platelets, complete metabolic panel with GFR, hemoglobin A1C, lipid panel, TSH- future Next appt:  6 month follow up

## 2019-04-14 NOTE — Patient Instructions (Signed)
We recommend you take tylenol in the morning to keep your right knee from hurting. Start using your cane at all times.  You may also use otc topical medication and ice on the knee for pain.    Glad you had your first covid vaccine.

## 2019-04-18 ENCOUNTER — Ambulatory Visit: Payer: Medicare Other | Admitting: Internal Medicine

## 2019-05-03 DIAGNOSIS — H524 Presbyopia: Secondary | ICD-10-CM | POA: Diagnosis not present

## 2019-05-03 DIAGNOSIS — H0100A Unspecified blepharitis right eye, upper and lower eyelids: Secondary | ICD-10-CM | POA: Diagnosis not present

## 2019-05-03 DIAGNOSIS — H401134 Primary open-angle glaucoma, bilateral, indeterminate stage: Secondary | ICD-10-CM | POA: Diagnosis not present

## 2019-05-03 DIAGNOSIS — H18593 Other hereditary corneal dystrophies, bilateral: Secondary | ICD-10-CM | POA: Diagnosis not present

## 2019-09-16 ENCOUNTER — Other Ambulatory Visit: Payer: Self-pay | Admitting: Internal Medicine

## 2019-10-11 ENCOUNTER — Other Ambulatory Visit: Payer: Medicare Other

## 2019-10-11 ENCOUNTER — Other Ambulatory Visit: Payer: Self-pay

## 2019-10-11 DIAGNOSIS — C9110 Chronic lymphocytic leukemia of B-cell type not having achieved remission: Secondary | ICD-10-CM | POA: Diagnosis not present

## 2019-10-11 LAB — CBC WITH DIFFERENTIAL/PLATELET
Absolute Monocytes: 883 cells/uL (ref 200–950)
Basophils Absolute: 49 cells/uL (ref 0–200)
Basophils Relative: 0.5 %
Eosinophils Absolute: 116 cells/uL (ref 15–500)
Eosinophils Relative: 1.2 %
HCT: 39 % (ref 35.0–45.0)
Hemoglobin: 13.3 g/dL (ref 11.7–15.5)
Lymphs Abs: 5209 cells/uL — ABNORMAL HIGH (ref 850–3900)
MCH: 32.4 pg (ref 27.0–33.0)
MCHC: 34.1 g/dL (ref 32.0–36.0)
MCV: 94.9 fL (ref 80.0–100.0)
MPV: 9.7 fL (ref 7.5–12.5)
Monocytes Relative: 9.1 %
Neutro Abs: 3444 cells/uL (ref 1500–7800)
Neutrophils Relative %: 35.5 %
Platelets: 184 10*3/uL (ref 140–400)
RBC: 4.11 10*6/uL (ref 3.80–5.10)
RDW: 12.4 % (ref 11.0–15.0)
Total Lymphocyte: 53.7 %
WBC: 9.7 10*3/uL (ref 3.8–10.8)

## 2019-10-12 NOTE — Progress Notes (Signed)
WBC has trended down.  Lymphocytes (type of wbc) also trended down.  This is good.

## 2019-10-13 ENCOUNTER — Other Ambulatory Visit: Payer: Self-pay

## 2019-10-13 ENCOUNTER — Ambulatory Visit (INDEPENDENT_AMBULATORY_CARE_PROVIDER_SITE_OTHER): Payer: Medicare Other | Admitting: Internal Medicine

## 2019-10-13 ENCOUNTER — Encounter: Payer: Self-pay | Admitting: Internal Medicine

## 2019-10-13 VITALS — BP 122/72 | HR 70 | Temp 97.8°F | Ht 61.0 in | Wt 149.6 lb

## 2019-10-13 DIAGNOSIS — G3184 Mild cognitive impairment, so stated: Secondary | ICD-10-CM

## 2019-10-13 DIAGNOSIS — E7800 Pure hypercholesterolemia, unspecified: Secondary | ICD-10-CM | POA: Diagnosis not present

## 2019-10-13 DIAGNOSIS — C9110 Chronic lymphocytic leukemia of B-cell type not having achieved remission: Secondary | ICD-10-CM

## 2019-10-13 DIAGNOSIS — I10 Essential (primary) hypertension: Secondary | ICD-10-CM | POA: Diagnosis not present

## 2019-10-13 DIAGNOSIS — R739 Hyperglycemia, unspecified: Secondary | ICD-10-CM

## 2019-10-13 DIAGNOSIS — E039 Hypothyroidism, unspecified: Secondary | ICD-10-CM

## 2019-10-13 DIAGNOSIS — M5416 Radiculopathy, lumbar region: Secondary | ICD-10-CM

## 2019-10-13 NOTE — Patient Instructions (Addendum)
If your back pain radiating down your right leg returns and interferes with your day, please do the following: Do your stretches daily from therapy Take your tylenol twice a day. Use a heating pad up to 4 times a day for 20 minute intervals Apply a salonpas with lidocaine patch over the right lower back region  If you have sudden loss of bowel or bladder control or become numb, this is an emergency.  I recommend you have a life alert button in case of a fall or other emergency.  I also think it's important that someone check on you twice a week at least when your daughter is away.    You agreed to have someone with you for one day per week to learn your routine and offer any assistance needed.

## 2019-10-13 NOTE — Progress Notes (Signed)
Location:  [facility] clinic Provider:  Jerron Niblack L. Mariea Clonts, D.O., C.M.D.  Code Status: DNR Goals of Care:  Advanced Directives 10/13/2019  Does Patient Have a Medical Advance Directive? Yes  Type of Paramedic of [place];Living will  Does patient want to make changes to medical advance directive? No - Patient declined  Copy of [place] in Chart? Yes - validated most recent copy scanned in chart (See row information)  Would patient like information on creating a medical advance directive? -  Pre-existing out of facility DNR order (yellow form or pink MOST form) -  we still have no living will, hcpoa or DNR on file for her  Chief Complaint  Patient presents with  . Medical Management of Chronic Issues    HPI: Patient is a 86 y.o. female seen today for medical management of chronic diseases.    Her low back has pain that radiates across her right lower back into the right leg and knee.  It hurts in her right groin to her knee.  It was there all of the time.  This week has been better.  Did take some tylenol for it.  Did not try other things at all.  Walking around or standing did not make any difference.    Does eat well.  Declines all help including a life alert button.    Labs reviewed with her and her daughter.  All stable.  Past Medical History:  Diagnosis Date  . Arthritis   . Glaucoma   . Hyperlipidemia   . Hypertension   . Osteoporosis   . Thyroid disease     Past Surgical History:  Procedure Laterality Date  . ABDOMINAL HYSTERECTOMY  1965   partial  . CHOLECYSTECTOMY  1989  . SKIN SURGERY  02/2014   Basel Cell removed from right side of face     No Known Allergies  Outpatient Encounter Medications as of 10/13/2019  Medication Sig  . amLODipine (NORVASC) 5 MG tablet TAKE 1 TABLET BY MOUTH ONCE DAILY  . Calcium Carbonate-Vit D-Min (CALCIUM 1200 PO) Take 1 tablet by mouth 2 (two) times daily.   . Cholecalciferol (VITAMIN D)  2000 UNITS CAPS Take 1 capsule (2,000 Units total) by mouth daily.  Marland Kitchen latanoprost (XALATAN) 0.005 % ophthalmic solution Place 1 drop into both eyes at bedtime.  Marland Kitchen levothyroxine (SYNTHROID) 25 MCG tablet TAKE 1 TABLET BY MOUTH ONCE DAILY BEFORE BREAKFAST FOR  THYROID  . Multiple Vitamins-Minerals (SENIOR MULTIVITAMIN PLUS PO) Take by mouth every morning.  . olmesartan (BENICAR) 20 MG tablet TAKE 1 TABLET BY MOUTH ONCE DAILY  . Omega-3 Fatty Acids (FISH OIL) 1000 MG CAPS Take 1,000 capsules by mouth 2 (two) times daily.  Vladimir Faster Glycol-Propyl Glycol (SYSTANE OP) Apply to eye.  . simvastatin (ZOCOR) 20 MG tablet TAKE 1 TABLET BY MOUTH ONCE DAILY FOR CHOLESTEROL   No facility-administered encounter medications on file as of 10/13/2019.    Review of Systems:  Review of Systems  Constitutional: Negative for chills, fever and malaise/fatigue.  Eyes: Negative for blurred vision.  Cardiovascular: Negative for chest pain, palpitations and leg swelling.  Neurological: Negative for dizziness and loss of consciousness.    Health Maintenance  Topic Date Due  . INFLUENZA VACCINE  10/23/2019  . TETANUS/TDAP  03/24/2022  . DEXA SCAN  Completed  . COVID-19 Vaccine  Completed  . PNA vac Low Risk Adult  Completed    Physical Exam: Vitals:   10/13/19 1002  BP: 122/72  Pulse: 70  Temp: 97.8 F (36.6 C)  TempSrc: Temporal  SpO2: 96%  Weight: 149 lb 9.6 oz (67.9 kg)  Height: 5\' 1"  (1.549 m)   Body mass index is 28.27 kg/m. Physical Exam Vitals reviewed.  Constitutional:      Appearance: Normal appearance.  Eyes:     Extraocular Movements: Extraocular movements intact.     Pupils: Pupils are equal, round, and reactive to light.     Comments: glasses  Cardiovascular:     Rate and Rhythm: Normal rate and regular rhythm.     Pulses: Normal pulses.     Heart sounds: Normal heart sounds.  Pulmonary:     Effort: Pulmonary effort is normal.     Breath sounds: Normal breath sounds. No  wheezing, rhonchi or rales.  Abdominal:     General: Bowel sounds are normal. There is no distension.     Palpations: Abdomen is soft.     Tenderness: There is no abdominal tenderness.  Musculoskeletal:        General: Tenderness present. Normal range of motion.     Right lower leg: Edema present.     Left lower leg: Edema present.     Comments: Tender over lower lumbar paravertebral muscles and SI regions  Skin:    General: Skin is warm and dry.  Neurological:     General: No focal deficit present.     Mental Status: She is alert.     Cranial Nerves: No cranial nerve deficit.     Gait: Gait abnormal.     Comments: Using walker  Psychiatric:        Mood and Affect: Mood normal.     Labs reviewed: Basic Metabolic Panel: Recent Labs    04/11/19 1045  NA 140  K 4.4  CL 104  CO2 28  GLUCOSE 104*  BUN 24  CREATININE 1.03*  CALCIUM 9.6  TSH 3.22   Liver Function Tests: Recent Labs    04/11/19 1045  AST 21  ALT 11  BILITOT 0.7  PROT 6.2   No results for input(s): LIPASE, AMYLASE in the last 8760 hours. No results for input(s): AMMONIA in the last 8760 hours. CBC: Recent Labs    04/11/19 1045 10/11/19 1041  WBC 10.9* 9.7  NEUTROABS 3,706 3,444  HGB 14.0 13.3  HCT 40.7 39.0  MCV 94.9 94.9  PLT 202 184   Lipid Panel: Recent Labs    04/11/19 1045  CHOL 138  HDL 60  LDLCALC 63  TRIG 71  CHOLHDL 2.3   Lab Results  Component Value Date   HGBA1C 5.6 04/11/2019    Procedures since last visit: No results found.  Assessment/Plan 1. Lumbar back pain with radiculopathy affecting right lower extremity -newly worse recently (though a little better right a this moment she keeps saying) AVS instruction provided:If your back pain radiating down your right leg returns and interferes with your day, please do the following: Do your stretches daily from therapy Take your tylenol twice a day. Use a heating pad up to 4 times a day for 20 minute intervals Apply a  salonpas with lidocaine patch over the right lower back region  If you have sudden loss of bowel or bladder control or become numb, this is an emergency.  2. Mild cognitive impairment with memory loss -per AVS:   I recommend you have a life alert button in case of a fall or other emergency.  I also think it's important  that someone check on you twice a week at least when your daughter is away.    You agreed to have someone with you for one day per week to learn your routine and offer any assistance needed.  3. Chronic lymphocytic leukemia (HCC) -wbc actually improved a bit from last time, monitor annually and prn changes with her  4. Essential hypertension, benign -bp at goal, cont same regimen and monitor  5. Acquired hypothyroidism -euthyroid, cont same levothyroxine and monitor annually and prn Lab Results  Component Value Date   TSH 3.22 04/11/2019    6. Hyperglycemia -in normal range this time, monitor annually Lab Results  Component Value Date   HGBA1C 5.6 04/11/2019   7. Pure hypercholesterolemia Lab Results  Component Value Date   CHOL 138 04/11/2019   HDL 60 04/11/2019   LDLCALC 63 04/11/2019   TRIG 71 04/11/2019   CHOLHDL 2.3 04/11/2019  LDL at goal, cont same zocor and monitor annually unless functional status declines or goals of care change  Labs/tests ordered:  No orders of the defined types were placed in this encounter.  Next appt:  6 mos med mgt, no labs unless needed same day   Shandy Vi L. Jozelynn Danielson, D.O. [facility] [address], [place] [address] Cell Phone (Mon-Fri 8am-5pm):  [phone] On Call:  [phone] & follow prompts after 5pm & weekends Office Phone:  [phone] Office Fax:  [phone]

## 2019-12-23 DIAGNOSIS — Z23 Encounter for immunization: Secondary | ICD-10-CM | POA: Diagnosis not present

## 2020-01-20 ENCOUNTER — Other Ambulatory Visit (HOSPITAL_BASED_OUTPATIENT_CLINIC_OR_DEPARTMENT_OTHER): Payer: Self-pay | Admitting: Internal Medicine

## 2020-01-20 ENCOUNTER — Ambulatory Visit: Payer: Medicare Other | Attending: Internal Medicine

## 2020-01-20 DIAGNOSIS — Z23 Encounter for immunization: Secondary | ICD-10-CM

## 2020-01-24 ENCOUNTER — Encounter: Payer: Self-pay | Admitting: Adult Health

## 2020-01-24 ENCOUNTER — Other Ambulatory Visit: Payer: Self-pay

## 2020-01-24 ENCOUNTER — Other Ambulatory Visit: Payer: Self-pay | Admitting: Adult Health

## 2020-01-24 ENCOUNTER — Ambulatory Visit (INDEPENDENT_AMBULATORY_CARE_PROVIDER_SITE_OTHER): Payer: Medicare Other | Admitting: Adult Health

## 2020-01-24 VITALS — BP 128/78 | HR 75 | Temp 97.1°F | Ht 61.0 in | Wt 149.8 lb

## 2020-01-24 DIAGNOSIS — N390 Urinary tract infection, site not specified: Secondary | ICD-10-CM

## 2020-01-24 DIAGNOSIS — R410 Disorientation, unspecified: Secondary | ICD-10-CM | POA: Diagnosis not present

## 2020-01-24 LAB — POCT URINALYSIS DIPSTICK
Bilirubin, UA: NEGATIVE
Glucose, UA: NEGATIVE
Ketones, UA: NEGATIVE
Nitrite, UA: NEGATIVE
Protein, UA: NEGATIVE
Spec Grav, UA: 1.015 (ref 1.010–1.025)
Urobilinogen, UA: 0.2 E.U./dL
pH, UA: 5 (ref 5.0–8.0)

## 2020-01-24 IMAGING — DX DG HAND COMPLETE 3+V*R*
3 series · 3 of 3 positions shown · non-contrast
Comparison: None.

CLINICAL DATA: The patient sustained a fall today and has bruising
over the fifth finger.

EXAM:
RIGHT HAND - COMPLETE 3+ VIEW

[hand pa]
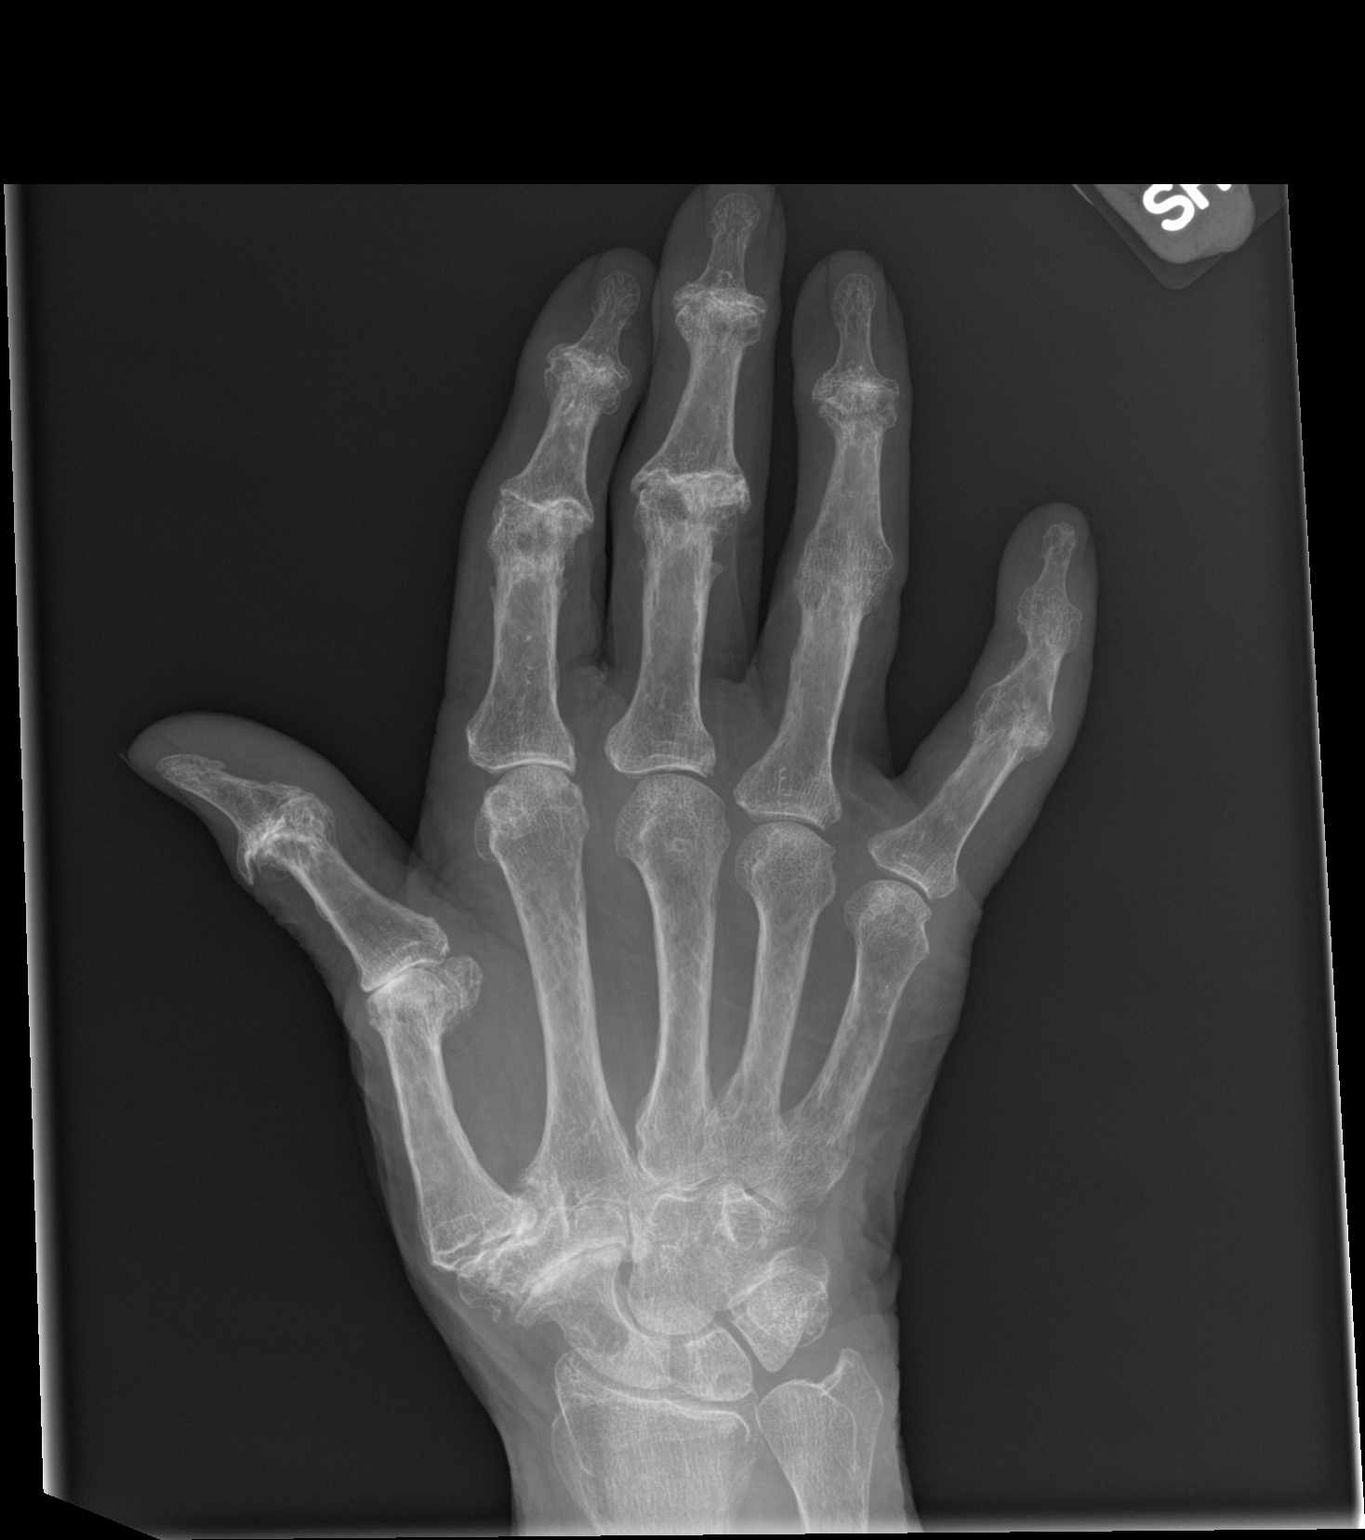

[hand obl]
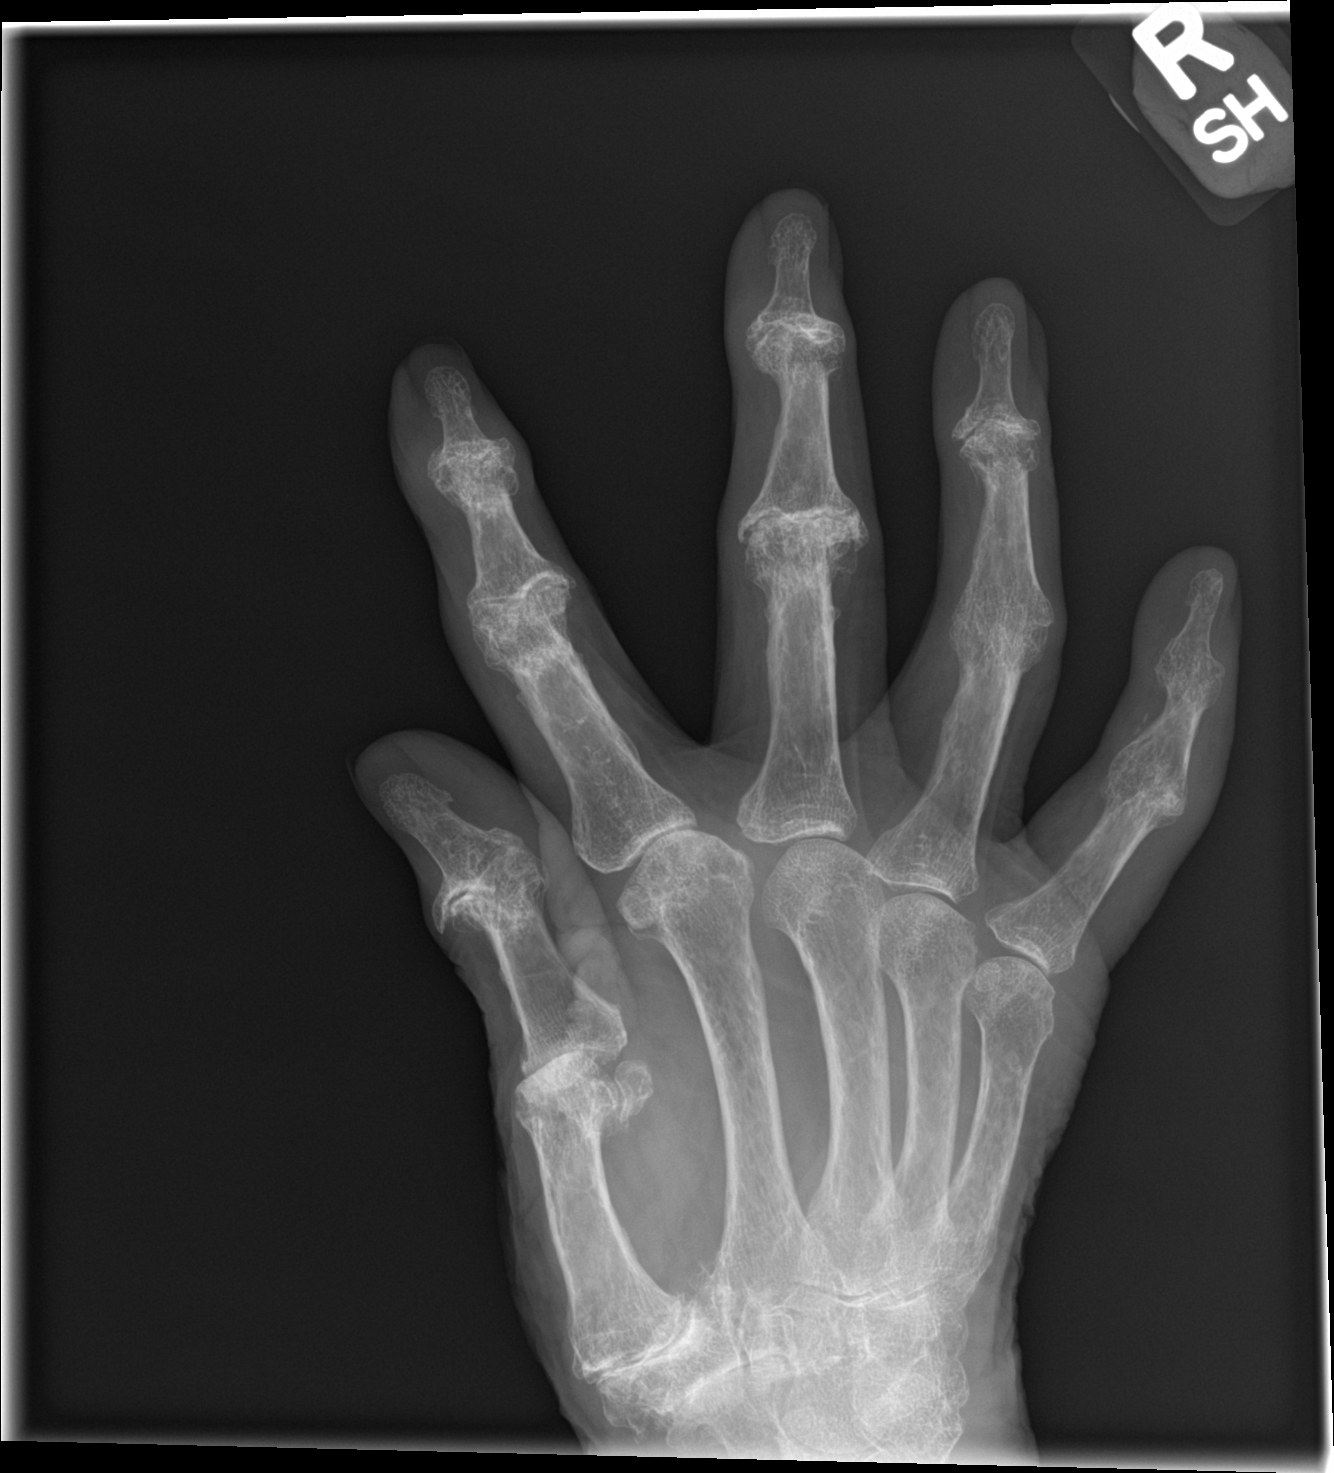

[hand lat]
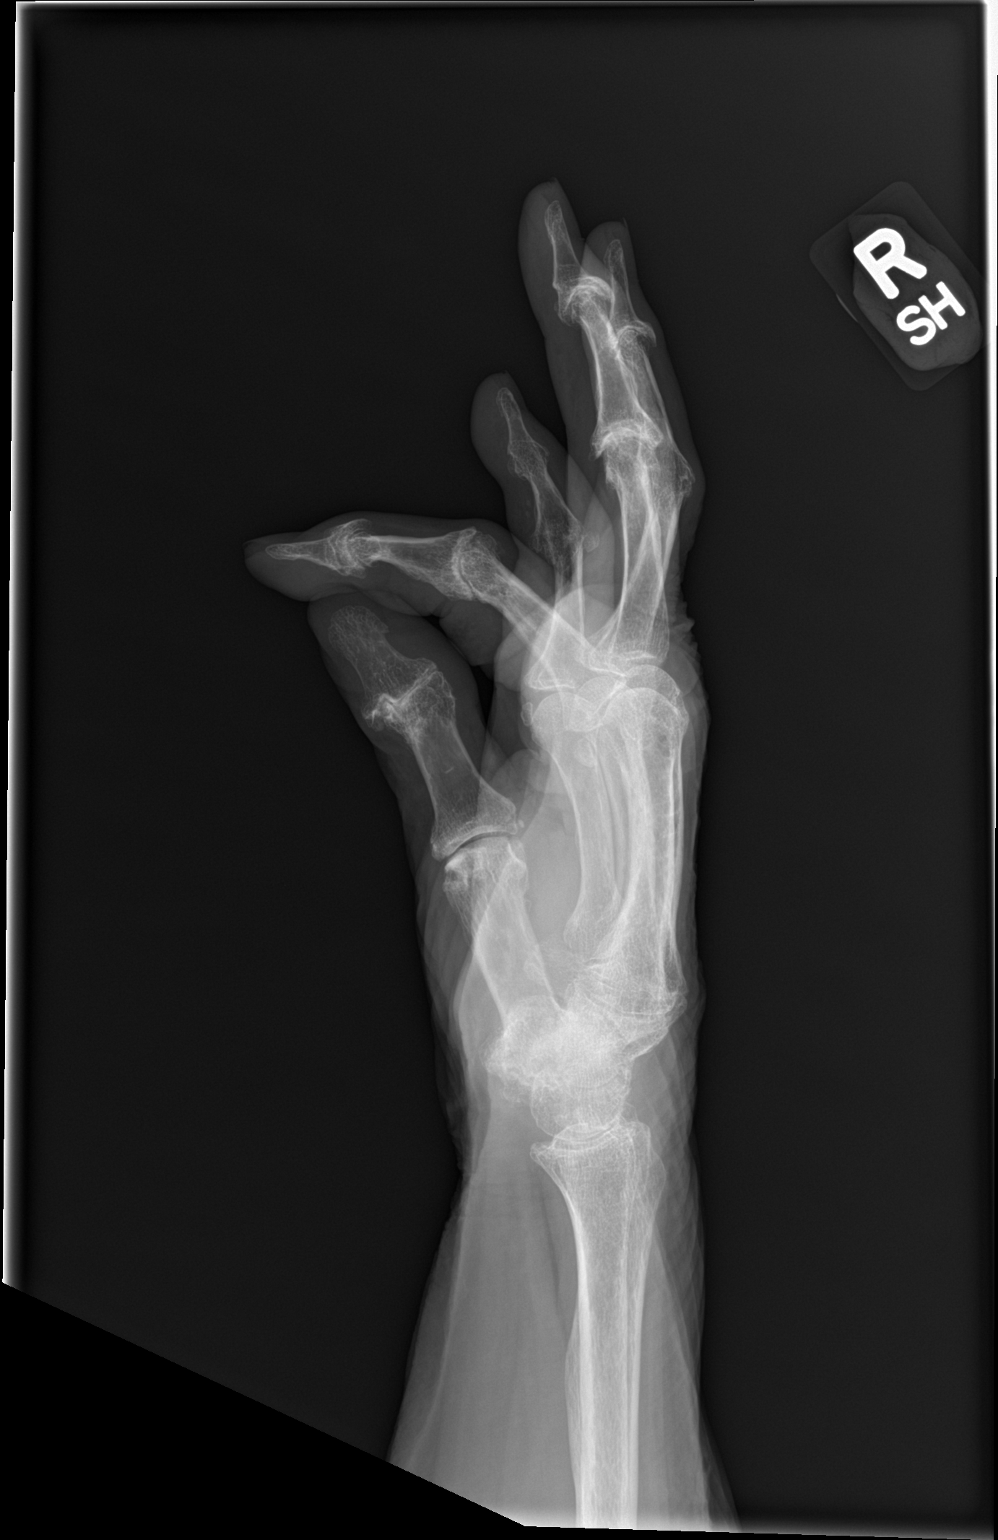

[3 of 3 positions shown; findings below may reference images not displayed]

FINDINGS: The bones are subjectively osteopenic. No acute fracture or
dislocation is observed. There is severe deforming osteoarthritic
change of the DIP joints diffusely. There is fusion across the IP
joints of the fifth finger and the PIP joint of the fourth finger.
There is narrowing of the MCP joints with the first and second MCP
joints being most significantly involved. There is moderate
degenerative change of the first CMC joint. Milder degenerative
changes of the other CMC joints and of the intercarpal joints is
observed. There is mild soft tissue swelling over the fifth finger
especially proximally.
IMPRESSION: There is no acute fracture nor dislocation of the bones of the right
hand. There is severe deforming osteoarthritis of the IP joints with
fusion across the IP joints of the fifth digit and PIP joint of the
fourth digit. There is moderate osteoarthritic change elsewhere.

## 2020-01-24 MED ORDER — SACCHAROMYCES BOULARDII 250 MG PO CAPS: 250.0000 mg | ORAL_CAPSULE | Freq: Two times a day (BID) | ORAL | 0 refills | Status: DC

## 2020-01-24 MED ORDER — CIPROFLOXACIN HCL 500 MG PO TABS: 500.0000 mg | ORAL_TABLET | Freq: Two times a day (BID) | ORAL | 0 refills | Status: DC

## 2020-01-24 MED FILL — CIPROFLOXACIN HCL 500 MG TA: 500 | 7 days supply | Qty: 14 | Fill #0

## 2020-01-24 MED FILL — FLORASTOR 250 MG CAPSULE: 250 | 10 days supply | Qty: 20 | Fill #0

## 2020-01-24 NOTE — Progress Notes (Signed)
[facility] clinic  Provider: Durenda Age - DNP  Code Status:  Full Code  Goals of Care:  Advanced Directives 01/24/2020  Does Patient Have a Medical Advance Directive? Yes  Type of Advance Directive Living will;Healthcare Power of Attorney  Does patient want to make changes to medical advance directive? No - Patient declined  Copy of [place] in Chart? Yes - validated most recent copy scanned in chart (See row information)  Would patient like information on creating a medical advance directive? -  Pre-existing out of facility DNR order (yellow form or pink MOST form) -     Chief Complaint  Patient presents with  . Acute Visit    Confusion, no complaints of a urinary tract infection, presents with daughter.  Daughter stated all medications remain the same.    HPI: Patient is a 86 y.o. female seen today for an acute visit for confusion. Patient came to the office with daughter. Daughter stated that patient claims that she has received a package and that somebody came to her house and stole it. She lives by herself and the daughter lives 5 minutes away. She was able to tell me the month but not the year. She knows that she is in a clinic. Patient denies having fever, hematuria, chills nor dysuria. According to daughter, she does not drink enough water. Urine dipstick showed leukocyte moderate, nitrite negative, protein negative, blood moderate.    Past Medical History:  Diagnosis Date  . Arthritis   . Glaucoma   . Hyperlipidemia   . Hypertension   . Osteoporosis   . Thyroid disease     Past Surgical History:  Procedure Laterality Date  . ABDOMINAL HYSTERECTOMY  1965   partial  . CHOLECYSTECTOMY  1989  . SKIN SURGERY  02/2014   Basel Cell removed from right side of face     No Known Allergies  Outpatient Encounter Medications as of 01/24/2020  Medication Sig  . amLODipine (NORVASC) 5 MG tablet TAKE 1 TABLET BY MOUTH ONCE DAILY  . Calcium  Carbonate-Vit D-Min (CALCIUM 1200 PO) Take 1 tablet by mouth 2 (two) times daily.   . Cholecalciferol (VITAMIN D) 2000 UNITS CAPS Take 1 capsule (2,000 Units total) by mouth daily.  . ciprofloxacin (CIPRO) 500 MG tablet Take 1 tablet (500 mg total) by mouth 2 (two) times daily for 7 days.  Marland Kitchen latanoprost (XALATAN) 0.005 % ophthalmic solution Place 1 drop into both eyes at bedtime.  Marland Kitchen levothyroxine (SYNTHROID) 25 MCG tablet TAKE 1 TABLET BY MOUTH ONCE DAILY BEFORE BREAKFAST FOR  THYROID  . Multiple Vitamins-Minerals (SENIOR MULTIVITAMIN PLUS PO) Take by mouth every morning.  . olmesartan (BENICAR) 20 MG tablet TAKE 1 TABLET BY MOUTH ONCE DAILY  . Omega-3 Fatty Acids (FISH OIL) 1000 MG CAPS Take 1,000 capsules by mouth 2 (two) times daily.  Vladimir Faster Glycol-Propyl Glycol (SYSTANE OP) Apply to eye.  . saccharomyces boulardii (FLORASTOR) 250 MG capsule Take 1 capsule (250 mg total) by mouth 2 (two) times daily for 10 days.  . simvastatin (ZOCOR) 20 MG tablet TAKE 1 TABLET BY MOUTH ONCE DAILY FOR CHOLESTEROL   No facility-administered encounter medications on file as of 01/24/2020.    Review of Systems:  Review of Systems  Constitutional: Negative for activity change, appetite change and fever.  HENT: Negative for congestion.   Respiratory: Negative for cough and shortness of breath.   Cardiovascular: Positive for leg swelling.  Gastrointestinal: Negative for abdominal pain and constipation.  Genitourinary: Positive for dysuria. Negative for difficulty urinating.  Musculoskeletal: Negative for gait problem.  Skin: Negative for color change.    Health Maintenance  Topic Date Due  . TETANUS/TDAP  03/24/2022  . INFLUENZA VACCINE  Completed  . DEXA SCAN  Completed  . COVID-19 Vaccine  Completed  . PNA vac Low Risk Adult  Completed    Physical Exam: Vitals:   01/24/20 1500  BP: 128/78  Pulse: 75  Temp: (!) 97.1 F (36.2 C)  TempSrc: Temporal  SpO2: 98%  Weight: 149 lb 12.8 oz (67.9  kg)  Height: 5\' 1"  (1.549 m)   Body mass index is 28.3 kg/m. Physical Exam Constitutional:      Appearance: Normal appearance.  HENT:     Head: Normocephalic and atraumatic.  Cardiovascular:     Rate and Rhythm: Normal rate and regular rhythm.     Pulses: Normal pulses.     Heart sounds: Normal heart sounds.  Pulmonary:     Effort: Pulmonary effort is normal.     Breath sounds: Normal breath sounds.  Abdominal:     Palpations: Abdomen is soft.  Musculoskeletal:        General: Normal range of motion.     Cervical back: Normal range of motion and neck supple.     Right lower leg: Edema present.     Left lower leg: Edema present.     Comments: BLE 2+edema  Skin:    General: Skin is warm and dry.  Neurological:     Mental Status: She is alert.  Psychiatric:        Mood and Affect: Mood normal.        Behavior: Behavior normal.     Labs reviewed: Basic Metabolic Panel: Recent Labs    04/11/19 1045  NA 140  K 4.4  CL 104  CO2 28  GLUCOSE 104*  BUN 24  CREATININE 1.03*  CALCIUM 9.6  TSH 3.22   Liver Function Tests: Recent Labs    04/11/19 1045  AST 21  ALT 11  BILITOT 0.7  PROT 6.2   CBC: Recent Labs    04/11/19 1045 10/11/19 1041  WBC 10.9* 9.7  NEUTROABS 3,706 3,444  HGB 14.0 13.3  HCT 40.7 39.0  MCV 94.9 94.9  PLT 202 184   Lipid Panel: Recent Labs    04/11/19 1045  CHOL 138  HDL 60  LDLCALC 63  TRIG 71  CHOLHDL 2.3   Lab Results  Component Value Date   HGBA1C 5.6 04/11/2019     Assessment/Plan  1. Confusion - was having confusion X 3 days, insisting that she received a package and somebody has stolen it but daughter got an e-mail stating that the package has not been shipped yet -  No fever, hematuria nor chills -  Confusion might be due to UTI for which she will be ordered Cipro and Florastor  2. Urinary tract infection without hematuria, site unspecified -  Leukocyte moderate, protein negative, blood moderate -  Will  start on Cipro and Florastor - ciprofloxacin (CIPRO) 500 MG tablet; Take 1 tablet (500 mg total) by mouth 2 (two) times daily for 7 days.  Dispense: 14 tablet; Refill: 0 - saccharomyces boulardii (FLORASTOR) 250 MG capsule; Take 1 capsule (250 mg total) by mouth 2 (two) times daily for 10 days.  Dispense: 20 capsule; Refill: 0 -Urine will be sent out for culture and sensitivity -  Instructed to increase water intake  Labs/tests ordered:  Urine  dipstick and culture and sensitivity  Next appt:  02/08/2020

## 2020-01-24 NOTE — Patient Instructions (Signed)
Acute Urinary Retention, Female  Acute urinary retention means that you cannot pee (urinate) at all, or that you pee too little and your bladder is not emptied completely. If it is not treated, it can lead to kidney damage or other serious problems. Follow these instructions at home:  Take over-the-counter and prescription medicines only as told by your doctor. Ask your doctor what medicines you should stay away from. Do not take any medicine unless your doctor says it is okay to do so.  If you were sent home with a tube that drains pee from the bladder (catheter), take care of it as told by your doctor.  Drink enough fluid to keep your pee clear or pale yellow.  If you were given an antibiotic, take it as told by your doctor. Do not stop taking the antibiotic even if you start to feel better.  Do not use any products that contain nicotine or tobacco, such as cigarettes and e-cigarettes. If you need help quitting, ask your doctor.  Watch for changes in your symptoms. Tell your doctor about them.  If told, keep track of any changes in your blood pressure at home. Tell your doctor about them.  Keep all follow-up visits as told by your doctor. This is important. Contact a doctor if:  You have spasms or you leak pee when you have spasms. Get help right away if:  You have chills or a fever.  You have blood in your pee.  You have a tube that drains the bladder and: ? The tube stops draining pee. ? The tube falls out. Summary  Acute urinary retention means that you cannot pee at all, or that you pee too little and your bladder is not emptied completely. If it is not treated, it can result in kidney damage or other serious problems.  If you were sent home with a tube that drains pee from the bladder, take care of it as told by your doctor.  Pay attention to any changes in your symptoms. Tell your doctor about them. This information is not intended to replace advice given to you by your  health care provider. Make sure you discuss any questions you have with your health care provider. Document Revised: 02/20/2017 Document Reviewed: 04/11/2016 Elsevier Patient Education  2020 [employer]  

## 2020-01-25 LAB — URINE CULTURE
MICRO NUMBER:: 11151399
SPECIMEN QUALITY:: ADEQUATE

## 2020-01-26 MED FILL — MODERNA COVID-19 VACCINE 10: 100 | 1 days supply | Qty: 0 | Fill #0

## 2020-01-26 NOTE — Progress Notes (Signed)
Urine collected was probably contaminated. If patient is still having confusion, then a repeat urine culture can be done.

## 2020-02-08 ENCOUNTER — Telehealth: Payer: Self-pay

## 2020-02-08 ENCOUNTER — Encounter: Payer: Self-pay | Admitting: Nurse Practitioner

## 2020-02-08 ENCOUNTER — Ambulatory Visit (INDEPENDENT_AMBULATORY_CARE_PROVIDER_SITE_OTHER): Payer: Medicare Other | Admitting: Nurse Practitioner

## 2020-02-08 ENCOUNTER — Other Ambulatory Visit: Payer: Self-pay

## 2020-02-08 DIAGNOSIS — Z0000 Encounter for general adult medical examination without abnormal findings: Secondary | ICD-10-CM | POA: Diagnosis not present

## 2020-02-08 NOTE — Patient Instructions (Signed)
Krystal [name] , Thank you for taking time to come for your Medicare Wellness Visit. I appreciate your ongoing commitment to your health goals. Please review the following plan we discussed and let me know if I can assist you in the future.   Screening recommendations/referrals: Colonoscopy aged out Mammogram aged out Bone Density up to date Recommended yearly ophthalmology/optometry visit for glaucoma screening and checkup Recommended yearly dental visit for hygiene and checkup  Vaccinations: Influenza vaccine up date Pneumococcal vaccine up date Tdap vaccine up date Shingles vaccine RECOMMENDED to get at local pharmacy    Advanced directives: on file.   Conditions/risks identified: advance age, debility, hypertension, hyperlipidemia.  Next appointment: 1 year for AWV  & 04/09/2020 with Dr Mariea Clonts   Preventive Care 84 Years and Older, Female Preventive care refers to lifestyle choices and visits with your health care provider that can promote health and wellness. What does preventive care include?  A yearly physical exam. This is also called an annual well check.  Dental exams once or twice a year.  Routine eye exams. Ask your health care provider how often you should have your eyes checked.  Personal lifestyle choices, including:  Daily care of your teeth and gums.  Regular physical activity.  Eating a healthy diet.  Avoiding tobacco and drug use.  Limiting alcohol use.  Practicing safe sex.  Taking low-dose aspirin every day.  Taking vitamin and mineral supplements as recommended by your health care provider. What happens during an annual well check? The services and screenings done by your health care provider during your annual well check will depend on your age, overall health, lifestyle risk factors, and family history of disease. Counseling  Your health care provider may ask you questions about your:  Alcohol use.  Tobacco use.  Drug use.  Emotional  well-being.  Home and relationship well-being.  Sexual activity.  Eating habits.  History of falls.  Memory and ability to understand (cognition).  Work and work Statistician.  Reproductive health. Screening  You may have the following tests or measurements:  Height, weight, and BMI.  Blood pressure.  Lipid and cholesterol levels. These may be checked every 5 years, or more frequently if you are over 4 years old.  Skin check.  Lung cancer screening. You may have this screening every year starting at age 57 if you have a 30-pack-year history of smoking and currently smoke or have quit within the past 15 years.  Fecal occult blood test (FOBT) of the stool. You may have this test every year starting at age 4.  Flexible sigmoidoscopy or colonoscopy. You may have a sigmoidoscopy every 5 years or a colonoscopy every 10 years starting at age 21.  Hepatitis C blood test.  Hepatitis B blood test.  Sexually transmitted disease (STD) testing.  Diabetes screening. This is done by checking your blood sugar (glucose) after you have not eaten for a while (fasting). You may have this done every 1-3 years.  Bone density scan. This is done to screen for osteoporosis. You may have this done starting at age 68.  Mammogram. This may be done every 1-2 years. Talk to your health care provider about how often you should have regular mammograms. Talk with your health care provider about your test results, treatment options, and if necessary, the need for more tests. Vaccines  Your health care provider may recommend certain vaccines, such as:  Influenza vaccine. This is recommended every year.  Tetanus, diphtheria, and acellular pertussis (Tdap, Td)  vaccine. You may need a Td booster every 10 years.  Zoster vaccine. You may need this after age 42.  Pneumococcal 13-valent conjugate (PCV13) vaccine. One dose is recommended after age 4.  Pneumococcal polysaccharide (PPSV23) vaccine. One  dose is recommended after age 2. Talk to your health care provider about which screenings and vaccines you need and how often you need them. This information is not intended to replace advice given to you by your health care provider. Make sure you discuss any questions you have with your health care provider. Document Released: 04/06/2015 Document Revised: 11/28/2015 Document Reviewed: 01/09/2015 Elsevier Interactive Patient Education  2017 [employer] Prevention in the Home Falls can cause injuries. They can happen to people of all ages. There are many things you can do to make your home safe and to help prevent falls. What can I do on the outside of my home?  Regularly fix the edges of walkways and driveways and fix any cracks.  Remove anything that might make you trip as you walk through a door, such as a raised step or threshold.  Trim any bushes or trees on the path to your home.  Use bright outdoor lighting.  Clear any walking paths of anything that might make someone trip, such as rocks or tools.  Regularly check to see if handrails are loose or broken. Make sure that both sides of any steps have handrails.  Any raised decks and porches should have guardrails on the edges.  Have any leaves, snow, or ice cleared regularly.  Use sand or salt on walking paths during winter.  Clean up any spills in your garage right away. This includes oil or grease spills. What can I do in the bathroom?  Use night lights.  Install grab bars by the toilet and in the tub and shower. Do not use towel bars as grab bars.  Use non-skid mats or decals in the tub or shower.  If you need to sit down in the shower, use a plastic, non-slip stool.  Keep the floor dry. Clean up any water that spills on the floor as soon as it happens.  Remove soap buildup in the tub or shower regularly.  Attach bath mats securely with double-sided non-slip rug tape.  Do not have throw rugs and other  things on the floor that can make you trip. What can I do in the bedroom?  Use night lights.  Make sure that you have a light by your bed that is easy to reach.  Do not use any sheets or blankets that are too big for your bed. They should not hang down onto the floor.  Have a firm chair that has side arms. You can use this for support while you get dressed.  Do not have throw rugs and other things on the floor that can make you trip. What can I do in the kitchen?  Clean up any spills right away.  Avoid walking on wet floors.  Keep items that you use a lot in easy-to-reach places.  If you need to reach something above you, use a strong step stool that has a grab bar.  Keep electrical cords out of the way.  Do not use floor polish or wax that makes floors slippery. If you must use wax, use non-skid floor wax.  Do not have throw rugs and other things on the floor that can make you trip. What can I do with my stairs?  Do not leave  any items on the stairs.  Make sure that there are handrails on both sides of the stairs and use them. Fix handrails that are broken or loose. Make sure that handrails are as long as the stairways.  Check any carpeting to make sure that it is firmly attached to the stairs. Fix any carpet that is loose or worn.  Avoid having throw rugs at the top or bottom of the stairs. If you do have throw rugs, attach them to the floor with carpet tape.  Make sure that you have a light switch at the top of the stairs and the bottom of the stairs. If you do not have them, ask someone to add them for you. What else can I do to help prevent falls?  Wear shoes that:  Do not have high heels.  Have rubber bottoms.  Are comfortable and fit you well.  Are closed at the toe. Do not wear sandals.  If you use a stepladder:  Make sure that it is fully opened. Do not climb a closed stepladder.  Make sure that both sides of the stepladder are locked into place.  Ask  someone to hold it for you, if possible.  Clearly mark and make sure that you can see:  Any grab bars or handrails.  First and last steps.  Where the edge of each step is.  Use tools that help you move around (mobility aids) if they are needed. These include:  Canes.  Walkers.  Scooters.  Crutches.  Turn on the lights when you go into a dark area. Replace any light bulbs as soon as they burn out.  Set up your furniture so you have a clear path. Avoid moving your furniture around.  If any of your floors are uneven, fix them.  If there are any pets around you, be aware of where they are.  Review your medicines with your doctor. Some medicines can make you feel dizzy. This can increase your chance of falling. Ask your doctor what other things that you can do to help prevent falls. This information is not intended to replace advice given to you by your health care provider. Make sure you discuss any questions you have with your health care provider. Document Released: 01/04/2009 Document Revised: 08/16/2015 Document Reviewed: 04/14/2014 Elsevier Interactive Patient Education  2017 [employer].

## 2020-02-08 NOTE — Telephone Encounter (Signed)
[name], zemanek are scheduled for a virtual visit with your provider today.    Just as we do with appointments in the office, we must obtain your consent to participate.  Your consent will be active for this visit and any virtual visit you may have with one of our providers in the next 365 days.    If you have a MyChart account, I can also send a copy of this consent to you electronically.  All virtual visits are billed to your insurance company just like a traditional visit in the office.  As this is a virtual visit, video technology does not allow for your provider to perform a traditional examination.  This may limit your provider's ability to fully assess your condition.  If your provider identifies any concerns that need to be evaluated in person or the need to arrange testing such as labs, EKG, etc, we will make arrangements to do so.    Although advances in technology are sophisticated, we cannot ensure that it will always work on either your end or our end.  If the connection with a video visit is poor, we may have to switch to a telephone visit.  With either a video or telephone visit, we are not always able to ensure that we have a secure connection.   I need to obtain your verbal consent now.   Are you willing to proceed with your visit today?   Krystal [name] has provided verbal consent on 02/08/2020 for a virtual visit (video or telephone).   [name], [place] 02/08/2020  12:45 PM

## 2020-02-08 NOTE — Progress Notes (Signed)
° °  This service is provided via telemedicine  No vital signs collected/recorded due to the encounter was a telemedicine visit.   Location of patient (ex: home, work):  Home  Patient consents to a telephone visit:  See Tele health consent from 02/08/2020  Location of the provider (ex: office, home): [facility]  Name of any referring provider:  Dr. Hollace Kinnier MD  Names of all persons participating in the telemedicine service and their role in the encounter: Marisa Cyphers RMA Sherrie Mustache NP, Patient and Daughter Fredric Mare  Time spent on call:  8 min.

## 2020-02-08 NOTE — Progress Notes (Signed)
Subjective:   Krystal [name] is a 86 y.o. female who presents for Medicare Annual (Subsequent) preventive examination.  Review of Systems     Cardiac Risk Factors include: sedentary lifestyle;advanced age (>62men, >58 women);dyslipidemia;hypertension;family history of premature cardiovascular disease     Objective:    Today's Vitals   There is no height or weight on file to calculate BMI.  Advanced Directives 02/08/2020 01/24/2020 10/13/2019 04/14/2019 02/04/2019 04/11/2018 03/02/2018  Does Patient Have a Medical Advance Directive? Yes Yes Yes Yes Yes Yes No  Type of Paramedic of [place];Living will Living will;Healthcare Power of [place];Living will [place];Living will [place];Living will [place];Living will -  Does patient want to make changes to medical advance directive? No - Patient declined No - Patient declined No - Patient declined No - Patient declined No - Patient declined No - Patient declined -  Copy of [place] in Chart? Yes - validated most recent copy scanned in chart (See row information) Yes - validated most recent copy scanned in chart (See row information) Yes - validated most recent copy scanned in chart (See row information) Yes - validated most recent copy scanned in chart (See row information) Yes - validated most recent copy scanned in chart (See row information) Yes - validated most recent copy scanned in chart (See row information) -  Would patient like information on creating a medical advance directive? - - - - - - No - Patient declined  Pre-existing out of facility DNR order (yellow form or pink MOST form) - - - - - - -    Current Medications (verified) Outpatient Encounter Medications as of 02/08/2020  Medication Sig   amLODipine (NORVASC) 5 MG tablet TAKE 1 TABLET BY MOUTH ONCE DAILY   Calcium Carbonate-Vit D-Min  (CALCIUM 1200 PO) Take 1 tablet by mouth 2 (two) times daily.    Cholecalciferol (VITAMIN D) 2000 UNITS CAPS Take 1 capsule (2,000 Units total) by mouth daily.   latanoprost (XALATAN) 0.005 % ophthalmic solution Place 1 drop into both eyes at bedtime.   levothyroxine (SYNTHROID) 25 MCG tablet TAKE 1 TABLET BY MOUTH ONCE DAILY BEFORE BREAKFAST FOR  THYROID   Multiple Vitamins-Minerals (SENIOR MULTIVITAMIN PLUS PO) Take by mouth every morning.   olmesartan (BENICAR) 20 MG tablet TAKE 1 TABLET BY MOUTH ONCE DAILY   Omega-3 Fatty Acids (FISH OIL) 1000 MG CAPS Take 1,000 capsules by mouth 2 (two) times daily.   Polyethyl Glycol-Propyl Glycol (SYSTANE OP) Apply to eye.   simvastatin (ZOCOR) 20 MG tablet TAKE 1 TABLET BY MOUTH ONCE DAILY FOR CHOLESTEROL   No facility-administered encounter medications on file as of 02/08/2020.    Allergies (verified) Patient has no known allergies.   History: Past Medical History:  Diagnosis Date   Arthritis    Glaucoma    Hyperlipidemia    Hypertension    Osteoporosis    Thyroid disease    Past Surgical History:  Procedure Laterality Date   ABDOMINAL HYSTERECTOMY  1965   partial   [place]  02/2014   [place] Cell removed from right side of face    Family History  Problem Relation Age of Onset   Cancer Mother    Cancer Father        lung   Heart disease Father    Social History   Socioeconomic History   Marital status: Widowed  Spouse name: Not on file   Number of children: Not on file   Years of education: Not on file   Highest education level: Not on file  Occupational History   Not on file  Tobacco Use   Smoking status: Never Smoker   Smokeless tobacco: Never Used   Tobacco comment: never used tobacco  Vaping Use   Vaping Use: Never used  Substance and Sexual Activity   Alcohol use: No    Alcohol/week: 0.0 standard drinks   Drug use: No   Sexual activity: Not  Currently  Other Topics Concern   Not on file  Social History Narrative   Caffeine: Coffee   Widow, married in 1947   Lives in house, 1 stories, one person, one dog   Current/past profession: Network engineer   Exercise: No   Living Will: Yes   DNR: No, would not like to discuss              Social Determinants of Radio broadcast assistant Strain:    Difficulty of Paying Living Expenses: Not on file  Food Insecurity:    Worried About Charity fundraiser in the Last Year: Not on file   [employer] of Food in the Last Year: Not on file  Transportation Needs:    Lack of Transportation (Medical): Not on file   Lack of Transportation (Non-Medical): Not on file  Physical Activity:    Days of Exercise per Week: Not on file   Minutes of Exercise per Session: Not on file  Stress:    Feeling of Stress : Not on file  Social Connections:    Frequency of Communication with Friends and Family: Not on file   Frequency of Social Gatherings with Friends and Family: Not on file   Attends Religious Services: Not on file   Active Member of Clubs or Organizations: Not on file   Attends Archivist Meetings: Not on file   Marital Status: Not on file    Tobacco Counseling Counseling given: Not Answered Comment: never used tobacco   Clinical Intake:  Pre-visit preparation completed: Yes  Pain : 0-10 Pain Score:  ("hurts") Pain Type: Acute pain Pain Location: Leg (upper leg) Pain Orientation: Right, Upper Pain Descriptors / Indicators: Aching Pain Onset: 1 to 4 weeks ago Pain Frequency: Intermittent Pain Relieving Factors: exercises Effect of Pain on Daily Activities: none  Pain Relieving Factors: exercises  BMI - recorded: 28 Nutritional Status: BMI 25 -29 Overweight Nutritional Risks: None Diabetes: No  How often do you need to have someone help you when you read instructions, pamphlets, or other written materials from your doctor or pharmacy?: 1 -  Never  Diabetic?no         Activities of Daily Living In your present state of health, do you have any difficulty performing the following activities: 02/08/2020  Hearing? Y  Vision? N  Difficulty concentrating or making decisions? N  Walking or climbing stairs? N  Dressing or bathing? N  Doing errands, shopping? N  Preparing Food and eating ? N  Using the Toilet? N  In the past six months, have you accidently leaked urine? N  Do you have problems with loss of bowel control? N  Managing your Medications? N  Managing your Finances? N  Housekeeping or managing your Housekeeping? N  Some recent data might be hidden    Patient Care Team: Gayland Curry, DO as PCP - General (Geriatric Medicine) [name], MD as  Consulting Physician (Oncology) [name], MD as Consulting Physician (Ophthalmology)  Indicate any recent Medical Services you may have received from other than [facility] providers in the past year (date may be approximate).     Assessment:   This is a routine wellness examination for Krystal [name].  Hearing/Vision screen No exam data present  Dietary issues and exercise activities discussed: Current Exercise Habits: The patient does not participate in regular exercise at present  Goals     Eat more fruits and vegetables     Patient will start increasing fruits and vegetables and protein in diet.     Patient Stated     Patient will increase water to 4 cups a day      Depression Screen PHQ 2/9 Scores 02/08/2020 10/13/2019 04/14/2019 02/04/2019 10/18/2018 04/05/2018 02/01/2018  PHQ - 2 Score 0 0 0 0 0 0 0    Fall Risk Fall Risk  02/08/2020 10/13/2019 04/14/2019 02/04/2019 10/18/2018  Falls in the past year? 0 0 0 1 0  Number falls in past yr: 0 0 0 0 0  Injury with Fall? 0 0 1 0 0  Comment - - - - -  Risk for fall due to : - - - - -    Any stairs in or around the home? No  If so, are there any without handrails? No  Home free of loose throw rugs in  walkways, pet beds, electrical cords, etc? Yes  Adequate lighting in your home to reduce risk of falls? Yes   ASSISTIVE DEVICES UTILIZED TO PREVENT FALLS:  Life alert? No  Use of a cane, walker or w/c? Yes  Grab bars in the bathroom? Yes  Shower chair or bench in shower? Yes  Elevated toilet seat or a handicapped toilet? No   TIMED UP AND GO:  Was the test performed? No .    Cognitive Function: MMSE - Mini Mental State Exam 04/05/2018 02/01/2018 09/26/2016 09/10/2015 07/31/2014  Orientation to time 5 5 5 5 4   Orientation to Place 4 4 4 4 4   Registration 3 3 3 3 3   Attention/ Calculation 5 5 5 5 5   Recall 3 3 2 3 1   Language- name 2 objects 2 2 2 2 2   Language- repeat 0 1 1 1 1   Language- follow 3 step command 3 3 3 3 3   Language- read & follow direction 1 1 1 1 1   Write a sentence 1 1 1 1 1   Copy design 1 1 1 1  0  Total score 28 29 28 29 25      6CIT Screen 02/08/2020  What Year? 0 points  What month? 0 points  What time? 0 points  Count back from 20 0 points  Months in reverse 0 points  Repeat phrase 10 points  Total Score 10    Immunizations Immunization History  Administered Date(s) Administered   Fluad Quad(high Dose 65+) 02/04/2019   Influenza,inj,Quad PF,6+ Mos 02/02/2015   Influenza-Unspecified 01/02/2014, 01/11/2016, 12/23/2019   Moderna SARS-COV2 Booster Vaccination 01/20/2020   PFIZER SARS-COV-2 Vaccination 04/03/2019, 05/14/2019   Pneumococcal Conjugate-13 04/24/2014   Pneumococcal-Unspecified 03/15/2013   Tdap 03/24/2012    TDAP status: Up to date Flu Vaccine status: Up to date Pneumococcal vaccine status: Up to date Covid-19 vaccine status: Completed vaccines  Qualifies for Shingles Vaccine? Yes   Zostavax completed No   Shingrix Completed?: No.    Education has been provided regarding the importance of this vaccine. Patient has been advised to call insurance  company to determine out of pocket expense if they have not yet received this  vaccine. Advised may also receive vaccine at local pharmacy or Health Dept. Verbalized acceptance and understanding.  Screening Tests Health Maintenance  Topic Date Due   TETANUS/TDAP  03/24/2022   INFLUENZA VACCINE  Completed   DEXA SCAN  Completed   COVID-19 Vaccine  Completed   PNA vac Low Risk Adult  Completed    Health Maintenance  There are no preventive care reminders to display for this patient.  Colorectal cancer screening: No longer required.  Mammogram status: No longer required.  Bone Density status: Completed 2020. Results reflect: Bone density results: OSTEOPENIA. Repeat every 2 years.  Lung Cancer Screening: (Low Dose CT Chest recommended if Age 78-80 years, 30 pack-year currently smoking OR have quit w/in 15years.) does not qualify.   Lung Cancer Screening Referral: na  Additional Screening:  Hepatitis C Screening: does not qualify; Completed na  Vision Screening: Recommended annual ophthalmology exams for early detection of glaucoma and other disorders of the eye. Is the patient up to date with their annual eye exam?  Yes  Who is the provider or what is the name of the office in which the patient attends annual eye exams? Dr Satira Sark If pt is not established with a provider, would they like to be referred to a provider to establish care? No .   Dental Screening: Recommended annual dental exams for proper oral hygiene  Community Resource Referral / Chronic Care Management: CRR required this visit?  No   CCM required this visit?  No      Plan:     I have personally reviewed and noted the following in the patients chart:    Medical and social history  Use of alcohol, tobacco or illicit drugs   Current medications and supplements  Functional ability and status  Nutritional status  Physical activity  Advanced directives  List of other physicians  Hospitalizations, surgeries, and ER visits in previous 12 months  Vitals  Screenings to  include cognitive, depression, and falls  Referrals and appointments  In addition, I have reviewed and discussed with patient certain preventive protocols, quality metrics, and best practice recommendations. A written personalized care plan for preventive services as well as general preventive health recommendations were provided to patient.     Lauree Chandler, NP   02/08/2020    Virtual Visit via Telephone Note  I connected with@ on 02/08/20 at  1:00 PM EST by telephone and verified that I am speaking with the correct person using two identifiers.  Location: Patient: home Provider: [facility]   I discussed the limitations, risks, security and privacy concerns of performing an evaluation and management service by telephone and the availability of in person appointments. I also discussed with the patient that there may be a patient responsible charge related to this service. The patient expressed understanding and agreed to proceed.   I discussed the assessment and treatment plan with the patient. The patient was provided an opportunity to ask questions and all were answered. The patient agreed with the plan and demonstrated an understanding of the instructions.   The patient was advised to call back or seek an in-person evaluation if the symptoms worsen or if the condition fails to improve as anticipated.  I provided 18 minutes of non-face-to-face time during this encounter.  Carlos American. Harle Battiest Avs printed and mailed

## 2020-04-09 ENCOUNTER — Ambulatory Visit: Payer: Medicare Other | Admitting: Internal Medicine

## 2020-04-16 ENCOUNTER — Ambulatory Visit: Payer: Medicare Other | Admitting: Internal Medicine

## 2020-04-30 ENCOUNTER — Encounter: Payer: Self-pay | Admitting: Internal Medicine

## 2020-04-30 ENCOUNTER — Other Ambulatory Visit: Payer: Self-pay

## 2020-04-30 ENCOUNTER — Ambulatory Visit (INDEPENDENT_AMBULATORY_CARE_PROVIDER_SITE_OTHER): Payer: Medicare Other | Admitting: Internal Medicine

## 2020-04-30 VITALS — BP 118/62 | HR 73 | Temp 97.9°F | Ht 61.0 in | Wt 149.8 lb

## 2020-04-30 DIAGNOSIS — R739 Hyperglycemia, unspecified: Secondary | ICD-10-CM | POA: Diagnosis not present

## 2020-04-30 DIAGNOSIS — C9110 Chronic lymphocytic leukemia of B-cell type not having achieved remission: Secondary | ICD-10-CM

## 2020-04-30 DIAGNOSIS — E7800 Pure hypercholesterolemia, unspecified: Secondary | ICD-10-CM

## 2020-04-30 DIAGNOSIS — M5416 Radiculopathy, lumbar region: Secondary | ICD-10-CM

## 2020-04-30 DIAGNOSIS — E039 Hypothyroidism, unspecified: Secondary | ICD-10-CM | POA: Diagnosis not present

## 2020-04-30 DIAGNOSIS — G3184 Mild cognitive impairment, so stated: Secondary | ICD-10-CM

## 2020-04-30 DIAGNOSIS — I10 Essential (primary) hypertension: Secondary | ICD-10-CM

## 2020-04-30 NOTE — Progress Notes (Signed)
Location:  [facility] clinic Provider:  Nick Armel L. Mariea Clonts, D.O., C.M.D.  Goals of Care:  Advanced Directives 04/30/2020  Does Patient Have a Medical Advance Directive? Yes  Type of Advance Directive -  Does patient want to make changes to medical advance directive? No - Patient declined  Copy of [place] in Chart? -  Would patient like information on creating a medical advance directive? -  Pre-existing out of facility DNR order (yellow form or pink MOST form) -     Chief Complaint  Patient presents with  . Medical Management of Chronic Issues    6 month follow up    HPI: Patient is a 87 y.o. female seen today for medical management of chronic diseases.   Her daughter asked her to stop driving.  She will say "my head is not right today".  She agreed to stop driving.    She had some paranoia and some things that were not happening seemed very real to her.  Her daughter thinks it may have been due to UTI.  After tx of UTI, it went away.  Explained to them about delirium.  She drinks one glass in a day of water.  She does not sit and eat a real meal--eats here and there.  She says she can't get used to doing something she hasn't done for 90 yrs.  Her son in law got her a special cup for water and all but she just doesn't drink.  Same leg pain she's had--nothing new.  She had this complaint even before moving here.  Her daughter reports it's gotten worse.  Taking calcium and fish oil just once a day.    BP is good.  The medicines are working well.  Past Medical History:  Diagnosis Date  . Arthritis   . Glaucoma   . Hyperlipidemia   . Hypertension   . Osteoporosis   . Thyroid disease     Past Surgical History:  Procedure Laterality Date  . ABDOMINAL HYSTERECTOMY  1965   partial  . CHOLECYSTECTOMY  1989  . SKIN SURGERY  02/2014   Basel Cell removed from right side of face     No Known Allergies  Outpatient Encounter Medications as of 04/30/2020  Medication Sig   . amLODipine (NORVASC) 5 MG tablet TAKE 1 TABLET BY MOUTH ONCE DAILY  . Calcium Carbonate-Vit D-Min (CALCIUM 1200 PO) Take 1 tablet by mouth daily.  . Cholecalciferol (VITAMIN D) 2000 UNITS CAPS Take 1 capsule (2,000 Units total) by mouth daily.  Marland Kitchen latanoprost (XALATAN) 0.005 % ophthalmic solution Place 1 drop into both eyes at bedtime.  Marland Kitchen levothyroxine (SYNTHROID) 25 MCG tablet TAKE 1 TABLET BY MOUTH ONCE DAILY BEFORE BREAKFAST FOR  THYROID  . Multiple Vitamins-Minerals (SENIOR MULTIVITAMIN PLUS PO) Take by mouth every morning.  . olmesartan (BENICAR) 20 MG tablet TAKE 1 TABLET BY MOUTH ONCE DAILY  . Omega-3 Fatty Acids (FISH OIL) 1000 MG CAPS Take 1,000 capsules by mouth daily.  Vladimir Faster Glycol-Propyl Glycol (SYSTANE OP) Apply to eye.  . simvastatin (ZOCOR) 20 MG tablet TAKE 1 TABLET BY MOUTH ONCE DAILY FOR CHOLESTEROL   No facility-administered encounter medications on file as of 04/30/2020.    Review of Systems:  Review of Systems  Constitutional: Negative for chills, fever and malaise/fatigue.  HENT: Positive for hearing loss. Negative for congestion and sore throat.   Eyes: Negative for blurred vision.       Glasses, glaucoma  Respiratory: Negative for cough  and shortness of breath.   Cardiovascular: Positive for leg swelling. Negative for chest pain and palpitations.  Gastrointestinal: Negative for abdominal pain, blood in stool, constipation and melena.  Genitourinary: Negative for dysuria.  Musculoskeletal: Positive for back pain. Negative for falls.  Neurological: Negative for dizziness, loss of consciousness and weakness.  Endo/Heme/Allergies: Bruises/bleeds easily.  Psychiatric/Behavioral: Positive for memory loss. Negative for depression. The patient is not nervous/anxious and does not have insomnia.     Health Maintenance  Topic Date Due  . COVID-19 Vaccine (4 - Booster) 07/20/2020  . TETANUS/TDAP  03/24/2022  . INFLUENZA VACCINE  Completed  . DEXA SCAN  Completed   . PNA vac Low Risk Adult  Completed    Physical Exam: Vitals:   04/30/20 1202  BP: 118/62  Pulse: 73  Temp: 97.9 F (36.6 C)  SpO2: 97%  Weight: 149 lb 12.8 oz (67.9 kg)  Height: 5\' 1"  (1.549 m)   Body mass index is 28.3 kg/m. Physical Exam Vitals reviewed.  Constitutional:      Appearance: Normal appearance.  HENT:     Ears:     Comments: HOH Eyes:     Conjunctiva/sclera: Conjunctivae normal.     Pupils: Pupils are equal, round, and reactive to light.  Cardiovascular:     Rate and Rhythm: Normal rate and regular rhythm.     Pulses: Normal pulses.     Heart sounds: Normal heart sounds.  Pulmonary:     Effort: Pulmonary effort is normal.     Breath sounds: Normal breath sounds. No wheezing, rhonchi or rales.  Abdominal:     General: Bowel sounds are normal.     Palpations: Abdomen is soft.     Tenderness: There is no abdominal tenderness.  Musculoskeletal:        General: Normal range of motion.     Right lower leg: Edema present.     Left lower leg: Edema present.     Comments: Nonpitting chronic edema of feet and ankles, cannot and will not wear compression stockings or consistently elevate feet  Skin:    General: Skin is warm and dry.  Neurological:     General: No focal deficit present.     Mental Status: She is alert and oriented to person, place, and time.     Comments: But short-term memory loss  Psychiatric:        Mood and Affect: Mood normal.        Behavior: Behavior normal.     Labs reviewed: Basic Metabolic Panel: No results for input(s): NA, K, CL, CO2, GLUCOSE, BUN, CREATININE, CALCIUM, MG, PHOS, TSH in the last 8760 hours. Liver Function Tests: No results for input(s): AST, ALT, ALKPHOS, BILITOT, PROT, ALBUMIN in the last 8760 hours. No results for input(s): LIPASE, AMYLASE in the last 8760 hours. No results for input(s): AMMONIA in the last 8760 hours. CBC: Recent Labs    10/11/19 1041  WBC 9.7  NEUTROABS 3,444  HGB 13.3  HCT 39.0   MCV 94.9  PLT 184   Lipid Panel: No results for input(s): CHOL, HDL, LDLCALC, TRIG, CHOLHDL, LDLDIRECT in the last 8760 hours. Lab Results  Component Value Date   HGBA1C 5.6 04/11/2019    Procedures since last visit: No results found.  Assessment/Plan 1. Lumbar back pain with radiculopathy affecting right lower extremity -chronic, does ok with her fish oil, won't take anything regularly for pain  2. Mild cognitive impairment with memory loss -had a delirium spell with a  UTI where she was delusional and paranoid but it fortunately cleared up with abx -educated on hydration again to prevent these and in general   3. Chronic lymphocytic leukemia ([place]) - has long been stable, monitor 1-2 times per year - CBC with Differential/Platelet  4. Essential hypertension, benign -bp at goal with benicar and amlodipine just 5mg   5. Acquired hypothyroidism -cont current levothyroxine 60mcg daily - TSH  6. Hyperglycemia - stable, monitor - COMPLETE METABOLIC PANEL WITH GFR - Hemoglobin A1c  7. Pure hypercholesterolemia - cont zocor - COMPLETE METABOLIC PANEL WITH GFR - Lipid panel  Labs/tests ordered:   Lab Orders     CBC with Differential/Platelet     COMPLETE METABOLIC PANEL WITH GFR     Hemoglobin A1c     Lipid panel     TSH  Next appt:  6 mos med mgt   Ayo Guarino L. Alexcia Schools, D.O. [facility] [address], [place] [address] Cell Phone (Mon-Fri 8am-5pm):  [phone] On Call:  [phone] & follow prompts after 5pm & weekends Office Phone:  [phone] Office Fax:  [phone]

## 2020-05-01 LAB — COMPLETE METABOLIC PANEL WITH GFR
AG Ratio: 2 (calc) (ref 1.0–2.5)
ALT: 8 U/L (ref 6–29)
AST: 20 U/L (ref 10–35)
Albumin: 4.2 g/dL (ref 3.6–5.1)
Alkaline phosphatase (APISO): 53 U/L (ref 37–153)
BUN/Creatinine Ratio: 18 (calc) (ref 6–22)
BUN: 20 mg/dL (ref 7–25)
CO2: 24 mmol/L (ref 20–32)
Calcium: 9.4 mg/dL (ref 8.6–10.4)
Chloride: 105 mmol/L (ref 98–110)
Creat: 1.12 mg/dL — ABNORMAL HIGH (ref 0.60–0.88)
GFR, Est African American: 49 mL/min/{1.73_m2} — ABNORMAL LOW (ref >=60)
GFR, Est Non African American: 43 mL/min/{1.73_m2} — ABNORMAL LOW (ref >=60)
Globulin: 2.1 g/dL (calc) (ref 1.9–3.7)
Glucose, Bld: 85 mg/dL (ref 65–99)
Potassium: 4.2 mmol/L (ref 3.5–5.3)
Sodium: 139 mmol/L (ref 135–146)
Total Bilirubin: 0.9 mg/dL (ref 0.2–1.2)
Total Protein: 6.3 g/dL (ref 6.1–8.1)

## 2020-05-01 LAB — TSH: TSH: 4.11 mIU/L (ref 0.40–4.50)

## 2020-05-01 LAB — CBC WITH DIFFERENTIAL/PLATELET
Absolute Monocytes: 840 cells/uL (ref 200–950)
Basophils Absolute: 42 cells/uL (ref 0–200)
Basophils Relative: 0.4 %
Eosinophils Absolute: 126 cells/uL (ref 15–500)
Eosinophils Relative: 1.2 %
HCT: 39.6 % (ref 35.0–45.0)
Hemoglobin: 13.5 g/dL (ref 11.7–15.5)
Lymphs Abs: 4715 cells/uL — ABNORMAL HIGH (ref 850–3900)
MCH: 31.8 pg (ref 27.0–33.0)
MCHC: 34.1 g/dL (ref 32.0–36.0)
MCV: 93.4 fL (ref 80.0–100.0)
MPV: 10.4 fL (ref 7.5–12.5)
Monocytes Relative: 8 %
Neutro Abs: 4778 cells/uL (ref 1500–7800)
Neutrophils Relative %: 45.5 %
Platelets: 222 10*3/uL (ref 140–400)
RBC: 4.24 10*6/uL (ref 3.80–5.10)
RDW: 12.2 % (ref 11.0–15.0)
Total Lymphocyte: 44.9 %
WBC: 10.5 10*3/uL (ref 3.8–10.8)

## 2020-05-01 LAB — LIPID PANEL
Cholesterol: 156 mg/dL (ref <200)
HDL: 65 mg/dL (ref >=50)
LDL Cholesterol (Calc): 77 mg/dL (calc)
Non-HDL Cholesterol (Calc): 91 mg/dL (calc) (ref <130)
Total CHOL/HDL Ratio: 2.4 (calc) (ref <5.0)
Triglycerides: 68 mg/dL (ref <150)

## 2020-05-01 LAB — HEMOGLOBIN A1C
Hgb A1c MFr Bld: 5.7 % of total Hgb — ABNORMAL HIGH (ref <5.7)
Mean Plasma Glucose: 117 mg/dL
eAG (mmol/L): 6.5 mmol/L

## 2020-05-01 NOTE — Progress Notes (Signed)
Only thing I'm worried about on her labs (given she is 41 and wants to be left alone :) ), is that her kidney function again indicates she's not drinking her water--we spent much of the visit talking about the importance of this and it's not been for lack of trying by her family. Sugar and cholesterol trended up a little, but not to get worried about.

## 2020-05-04 DIAGNOSIS — H401134 Primary open-angle glaucoma, bilateral, indeterminate stage: Secondary | ICD-10-CM | POA: Diagnosis not present

## 2020-05-04 DIAGNOSIS — H26491 Other secondary cataract, right eye: Secondary | ICD-10-CM | POA: Diagnosis not present

## 2020-05-04 DIAGNOSIS — H18593 Other hereditary corneal dystrophies, bilateral: Secondary | ICD-10-CM | POA: Diagnosis not present

## 2020-05-04 DIAGNOSIS — H524 Presbyopia: Secondary | ICD-10-CM | POA: Diagnosis not present

## 2020-05-14 ENCOUNTER — Encounter: Payer: Self-pay | Admitting: Internal Medicine

## 2020-08-02 ENCOUNTER — Other Ambulatory Visit: Payer: Self-pay | Admitting: *Deleted

## 2020-08-02 MED ORDER — OLMESARTAN MEDOXOMIL 20 MG PO TABS: 20.0000 mg | ORAL_TABLET | Freq: Every day | ORAL | 3 refills | Status: DC

## 2020-08-02 MED ORDER — LEVOTHYROXINE SODIUM 25 MCG PO TABS: ORAL_TABLET | ORAL | 3 refills | Status: DC

## 2020-08-02 MED ORDER — AMLODIPINE BESYLATE 5 MG PO TABS: 1.0000 | ORAL_TABLET | Freq: Every day | ORAL | 3 refills | Status: DC

## 2020-08-02 MED ORDER — SIMVASTATIN 20 MG PO TABS: 20.0000 mg | ORAL_TABLET | Freq: Every day | ORAL | 3 refills | Status: DC

## 2020-08-02 NOTE — Telephone Encounter (Signed)
[facility] requested refill.  ? ?

## 2020-08-02 NOTE — Telephone Encounter (Signed)
[facility] requested refills.  

## 2020-08-02 NOTE — Addendum Note (Signed)
Addended by: Rafael Bihari A on: 08/02/2020 09:32 AM   Modules accepted: Orders

## 2020-11-01 ENCOUNTER — Ambulatory Visit: Payer: Medicare Other | Admitting: Nurse Practitioner

## 2020-11-01 ENCOUNTER — Ambulatory Visit: Payer: Medicare Other | Admitting: Internal Medicine

## 2020-11-02 ENCOUNTER — Encounter: Payer: Self-pay | Admitting: Nurse Practitioner

## 2020-11-02 ENCOUNTER — Other Ambulatory Visit: Payer: Self-pay

## 2020-11-02 ENCOUNTER — Ambulatory Visit (INDEPENDENT_AMBULATORY_CARE_PROVIDER_SITE_OTHER): Payer: Medicare Other | Admitting: Nurse Practitioner

## 2020-11-02 VITALS — BP 130/70 | HR 74 | Temp 97.1°F | Ht 61.0 in | Wt 146.8 lb

## 2020-11-02 DIAGNOSIS — C9110 Chronic lymphocytic leukemia of B-cell type not having achieved remission: Secondary | ICD-10-CM | POA: Diagnosis not present

## 2020-11-02 DIAGNOSIS — M5416 Radiculopathy, lumbar region: Secondary | ICD-10-CM

## 2020-11-02 DIAGNOSIS — G3184 Mild cognitive impairment, so stated: Secondary | ICD-10-CM

## 2020-11-02 DIAGNOSIS — E039 Hypothyroidism, unspecified: Secondary | ICD-10-CM | POA: Diagnosis not present

## 2020-11-02 DIAGNOSIS — R739 Hyperglycemia, unspecified: Secondary | ICD-10-CM | POA: Diagnosis not present

## 2020-11-02 DIAGNOSIS — E7800 Pure hypercholesterolemia, unspecified: Secondary | ICD-10-CM | POA: Diagnosis not present

## 2020-11-02 DIAGNOSIS — I10 Essential (primary) hypertension: Secondary | ICD-10-CM

## 2020-11-02 MED ORDER — DONEPEZIL HCL 5 MG PO TABS
5.0000 mg | ORAL_TABLET | Freq: Every day | ORAL | 1 refills | Status: DC
Start: 2020-11-02 — End: 2021-02-25

## 2020-11-02 NOTE — Progress Notes (Signed)
Careteam: Patient Care Team: Krystal Chandler, NP as PCP - General (Geriatric Medicine) [name], MD as Consulting Physician (Oncology) [name], MD as Consulting Physician (Ophthalmology)  PLACE OF SERVICE:  [place] Directive information Does Patient Have a Medical Advance Directive?: Yes, Type of Advance Directive: [place];Living will, Does patient want to make changes to medical advance directive?: No - Patient declined  No Known Allergies  Chief Complaint  Patient presents with   Medical Management of Chronic Issues    6 month follow up. Patient is starting to have trouble with memory.     HPI: Patient is a 87 y.o. female for routine follow up.   She continues to live alone, she no longer drives.  Daughter has a concern that her memory is worse.  She is not remembering words. Daughter notices lack of interest. Does not feel like socializing with others. Interacts with hairdresser once a week.  Daughter would like medication if possible.  Good days and bad days.  More bad days recently.  She is still managing medications.  Does puzzles .  Back pain stable. Some days worse than other.   Htn- controlled on norvasc and benicar  Hyperlipdiemia- controles on zocor.    Review of Systems:  Review of Systems  Constitutional:  Negative for chills, fever and weight loss.  HENT:  Negative for tinnitus.   Respiratory:  Negative for cough, sputum production and shortness of breath.   Cardiovascular:  Negative for chest pain, palpitations and leg swelling.  Gastrointestinal:  Negative for abdominal pain, constipation, diarrhea and heartburn.  Genitourinary:  Negative for dysuria, frequency and urgency.  Musculoskeletal:  Positive for back pain. Negative for falls, joint pain and myalgias.  Skin: Negative.   Neurological:  Negative for dizziness and headaches.  Psychiatric/Behavioral:  Positive for memory loss. Negative for  depression. The patient does not have insomnia.    Past Medical History:  Diagnosis Date   Arthritis    Glaucoma    Hyperlipidemia    Hypertension    Osteoporosis    Thyroid disease    Past Surgical History:  Procedure Laterality Date   ABDOMINAL HYSTERECTOMY  1965   partial   CHOLECYSTECTOMY  1989   SKIN SURGERY  02/2014   Basel Cell removed from right side of face    Social History:   reports that she has never smoked. She has never used smokeless tobacco. She reports that she does not drink alcohol and does not use drugs.  Family History  Problem Relation Age of Onset   Cancer Mother    Cancer Father        lung   Heart disease Father     Medications: Patient's Medications  New Prescriptions   No medications on file  Previous Medications   AMLODIPINE (NORVASC) 5 MG TABLET    Take 1 tablet (5 mg total) by mouth daily.   CALCIUM CARBONATE-VIT D-MIN (CALCIUM 1200 PO)    Take 1 tablet by mouth daily.   CHOLECALCIFEROL (VITAMIN D) 2000 UNITS CAPS    Take 1 capsule (2,000 Units total) by mouth daily.   COVID-19 MRNA VACCINE, MODERNA, 100 MCG/0.5ML INJECTION    INJECT AS DIRECTED   LATANOPROST (XALATAN) 0.005 % OPHTHALMIC SOLUTION    Place 1 drop into both eyes at bedtime.   LEVOTHYROXINE (SYNTHROID) 25 MCG TABLET    Take one tablet by mouth once daily before breakfast for thyroid on empty stomach.  MULTIPLE VITAMINS-MINERALS (SENIOR MULTIVITAMIN PLUS PO)    Take by mouth every morning.   OLMESARTAN (BENICAR) 20 MG TABLET    Take 1 tablet (20 mg total) by mouth daily.   OMEGA-3 FATTY ACIDS (FISH OIL) 1000 MG CAPS    Take 1,000 capsules by mouth daily.   POLYETHYL GLYCOL-PROPYL GLYCOL (SYSTANE OP)    Apply to eye.   SIMVASTATIN (ZOCOR) 20 MG TABLET    Take 1 tablet (20 mg total) by mouth daily. for cholesterol.  Modified Medications   No medications on file  Discontinued Medications   CIPROFLOXACIN (CIPRO) 500 MG TABLET    TAKE 1 TABLET (500 MG TOTAL) BY MOUTH 2 (TWO)  TIMES DAILY FOR 7 DAYS.   SACCHAROMYCES BOULARDII (FLORASTOR) 250 MG CAPSULE    TAKE 1 CAPSULE (250 MG TOTAL) BY MOUTH 2 (TWO) TIMES DAILY FOR 10 DAYS.    Physical Exam:  Vitals:   11/02/20 1452  BP: 130/70  Pulse: 74  Temp: (!) 97.1 F (36.2 C)  TempSrc: Temporal  SpO2: 98%  Weight: 146 lb 12.8 oz (66.6 kg)  Height: '5\' 1"'  (1.549 m)   Body mass index is 27.74 kg/m. Wt Readings from Last 3 Encounters:  11/02/20 146 lb 12.8 oz (66.6 kg)  04/30/20 149 lb 12.8 oz (67.9 kg)  01/24/20 149 lb 12.8 oz (67.9 kg)    Physical Exam Constitutional:      General: She is not in acute distress.    Appearance: She is well-developed. She is not diaphoretic.  HENT:     Head: Normocephalic and atraumatic.     Mouth/Throat:     Pharynx: No oropharyngeal exudate.  Eyes:     Conjunctiva/sclera: Conjunctivae normal.     Pupils: Pupils are equal, round, and reactive to light.  Cardiovascular:     Rate and Rhythm: Normal rate and regular rhythm.     Heart sounds: Normal heart sounds.  Pulmonary:     Effort: Pulmonary effort is normal.     Breath sounds: Normal breath sounds.  Abdominal:     General: Bowel sounds are normal.     Palpations: Abdomen is soft.  Musculoskeletal:     Cervical back: Normal range of motion and neck supple.     Right lower leg: No edema.     Left lower leg: No edema.  Skin:    General: Skin is warm and dry.  Neurological:     Mental Status: She is alert.  Psychiatric:        Mood and Affect: Mood normal.   Labs reviewed: Basic Metabolic Panel: Recent Labs    04/30/20 1227  NA 139  K 4.2  CL 105  CO2 24  GLUCOSE 85  BUN 20  CREATININE 1.12*  CALCIUM 9.4  TSH 4.11   Liver Function Tests: Recent Labs    04/30/20 1227  AST 20  ALT 8  BILITOT 0.9  PROT 6.3   No results for input(s): LIPASE, AMYLASE in the last 8760 hours. No results for input(s): AMMONIA in the last 8760 hours. CBC: Recent Labs    04/30/20 1227  WBC 10.5  NEUTROABS  4,778  HGB 13.5  HCT 39.6  MCV 93.4  PLT 222   Lipid Panel: Recent Labs    04/30/20 1227  CHOL 156  HDL 65  LDLCALC 77  TRIG 68  CHOLHDL 2.4   TSH: Recent Labs    04/30/20 1227  TSH 4.11   A1C: Lab Results  Component Value Date  HGBA1C 5.7 (H) 04/30/2020     Assessment/Plan 1. Lumbar back pain with radiculopathy affecting right lower extremity -ongoing, stable at this time.  2. Chronic lymphocytic leukemia ([place]) -no longer followed by hematology. Following blood counts in office.  - CBC with Differential/Platelet  3. Essential hypertension, benign --stable. Goal bp <140/90. Continue on norvasc and benicar with low sodium diet.  - CMP with eGFR([facility]) - CBC with Differential/Platelet  4. Acquired hypothyroidism -continues on synthroid 37mg  - TSH  5. Hyperglycemia -will follow up lab. - Hemoglobin A1c  6. Pure hypercholesterolemia -continues on zocor. LDL at goal. Continue dietary modifications with medication management.  - Lipid panel - CMP with eGFR([facility])  7. Mild cognitive impairment with memory loss -worsening memory loss noted by daughter. CT head in 2020 noted atrophy. Suspect worsening of this. Continues to have support from family. She would like to start medication to slow progression at this time.  - donepezil (ARICEPT) 5 MG tablet; Take 1 tablet (5 mg total) by mouth daily.  Dispense: 90 tablet; Refill: 1   Next appt: 3 months.  JCarlos American EJuarez [facility] [phone]

## 2020-11-03 LAB — LIPID PANEL
Cholesterol: 150 mg/dL (ref <200)
HDL: 59 mg/dL (ref >=50)
LDL Cholesterol (Calc): 72 mg/dL (calc)
Non-HDL Cholesterol (Calc): 91 mg/dL (calc) (ref <130)
Total CHOL/HDL Ratio: 2.5 (calc) (ref <5.0)
Triglycerides: 100 mg/dL (ref <150)

## 2020-11-03 LAB — COMPLETE METABOLIC PANEL WITH GFR
AG Ratio: 2.2 (calc) (ref 1.0–2.5)
ALT: 9 U/L (ref 6–29)
AST: 18 U/L (ref 10–35)
Albumin: 4.1 g/dL (ref 3.6–5.1)
Alkaline phosphatase (APISO): 64 U/L (ref 37–153)
BUN: 17 mg/dL (ref 7–25)
CO2: 27 mmol/L (ref 20–32)
Calcium: 9.3 mg/dL (ref 8.6–10.4)
Chloride: 108 mmol/L (ref 98–110)
Creat: 0.94 mg/dL (ref 0.60–0.95)
Globulin: 1.9 g/dL (calc) (ref 1.9–3.7)
Glucose, Bld: 104 mg/dL — ABNORMAL HIGH (ref 65–99)
Potassium: 3.8 mmol/L (ref 3.5–5.3)
Sodium: 141 mmol/L (ref 135–146)
Total Bilirubin: 0.6 mg/dL (ref 0.2–1.2)
Total Protein: 6 g/dL — ABNORMAL LOW (ref 6.1–8.1)
eGFR: 57 mL/min/{1.73_m2} — ABNORMAL LOW (ref >=60)

## 2020-11-03 LAB — CBC WITH DIFFERENTIAL/PLATELET
Absolute Monocytes: 863 cells/uL (ref 200–950)
Basophils Absolute: 49 cells/uL (ref 0–200)
Basophils Relative: 0.5 %
Eosinophils Absolute: 136 cells/uL (ref 15–500)
Eosinophils Relative: 1.4 %
HCT: 41 % (ref 35.0–45.0)
Hemoglobin: 13.1 g/dL (ref 11.7–15.5)
Lymphs Abs: 4802 cells/uL — ABNORMAL HIGH (ref 850–3900)
MCH: 31 pg (ref 27.0–33.0)
MCHC: 32 g/dL (ref 32.0–36.0)
MCV: 96.9 fL (ref 80.0–100.0)
MPV: 10.1 fL (ref 7.5–12.5)
Monocytes Relative: 8.9 %
Neutro Abs: 3851 cells/uL (ref 1500–7800)
Neutrophils Relative %: 39.7 %
Platelets: 206 10*3/uL (ref 140–400)
RBC: 4.23 10*6/uL (ref 3.80–5.10)
RDW: 12.7 % (ref 11.0–15.0)
Total Lymphocyte: 49.5 %
WBC: 9.7 10*3/uL (ref 3.8–10.8)

## 2020-11-03 LAB — HEMOGLOBIN A1C
Hgb A1c MFr Bld: 5.5 % of total Hgb (ref <5.7)
Mean Plasma Glucose: 111 mg/dL
eAG (mmol/L): 6.2 mmol/L

## 2020-11-03 LAB — TSH: TSH: 3.91 mIU/L (ref 0.40–4.50)

## 2020-12-25 ENCOUNTER — Other Ambulatory Visit (HOSPITAL_BASED_OUTPATIENT_CLINIC_OR_DEPARTMENT_OTHER): Payer: Self-pay

## 2020-12-25 ENCOUNTER — Ambulatory Visit: Payer: Medicare Other | Attending: Internal Medicine

## 2020-12-25 DIAGNOSIS — Z23 Encounter for immunization: Secondary | ICD-10-CM | POA: Diagnosis not present

## 2020-12-25 MED ORDER — INFLUENZA VAC A&B SA ADJ QUAD 0.5 ML IM PRSY
PREFILLED_SYRINGE | INTRAMUSCULAR | 0 refills | Status: DC
  Filled 2020-12-25: qty 0.5, 1d supply, fill #0

## 2020-12-25 NOTE — Progress Notes (Signed)
   Covid-19 Vaccination Clinic  Name:  Krystal [name]    MRN: [id number] DOB: [birth year]  12/25/2020  Krystal [name] was observed post Covid-19 immunization for 15 minutes without incident. She was provided with Vaccine Information Sheet and instruction to access the V-Safe system.   Krystal [name] was instructed to call 911 with any severe reactions post vaccine: Difficulty breathing  Swelling of face and throat  A fast heartbeat  A bad rash all over body  Dizziness and weakness

## 2021-01-04 ENCOUNTER — Other Ambulatory Visit (HOSPITAL_BASED_OUTPATIENT_CLINIC_OR_DEPARTMENT_OTHER): Payer: Self-pay

## 2021-01-04 DIAGNOSIS — Z23 Encounter for immunization: Secondary | ICD-10-CM | POA: Diagnosis not present

## 2021-01-04 MED ORDER — COVID-19MRNA BIVAL VACC PFIZER 30 MCG/0.3ML IM SUSP
INTRAMUSCULAR | 0 refills | Status: DC
  Filled 2021-01-04: qty 0.3, 1d supply, fill #0

## 2021-02-08 ENCOUNTER — Ambulatory Visit: Payer: Medicare Other | Admitting: Nurse Practitioner

## 2021-02-12 ENCOUNTER — Other Ambulatory Visit: Payer: Self-pay

## 2021-02-12 ENCOUNTER — Ambulatory Visit (INDEPENDENT_AMBULATORY_CARE_PROVIDER_SITE_OTHER): Payer: Medicare Other | Admitting: Nurse Practitioner

## 2021-02-12 ENCOUNTER — Encounter: Payer: Self-pay | Admitting: Nurse Practitioner

## 2021-02-12 ENCOUNTER — Telehealth: Payer: Self-pay

## 2021-02-12 DIAGNOSIS — Z0000 Encounter for general adult medical examination without abnormal findings: Secondary | ICD-10-CM

## 2021-02-12 NOTE — Progress Notes (Signed)
This service is provided via telemedicine  No vital signs collected/recorded due to the encounter was a telemedicine visit.   Location of patient (ex: home, work):  Home  Patient consents to a telephone visit:  Yes, see encounter dated 02/12/2021  Location of the provider (ex: office, home):  [place]  Name of any referring provider:  N/A  Names of all persons participating in the telemedicine service and their role in the encounter:  Sherrie Mustache, Nurse Practitioner, Carroll Kinds, CMA, and patient.   Time spent on call:  13 minutes with medical assistant

## 2021-02-12 NOTE — Progress Notes (Signed)
Subjective:   Sherby Leyan Branden is a 87 y.o. female who presents for Medicare Annual (Subsequent) preventive examination.  Review of Systems     Cardiac Risk Factors include: sedentary lifestyle;advanced age (>74men, >90 women);family history of premature cardiovascular disease;hypertension;dyslipidemia     Objective:    There were no vitals filed for this visit. There is no height or weight on file to calculate BMI.  Advanced Directives 02/12/2021 11/02/2020 04/30/2020 02/08/2020 01/24/2020 10/13/2019 04/14/2019  Does Patient Have a Medical Advance Directive? Yes Yes Yes Yes Yes Yes Yes  Type of Paramedic of [place];Living will [place];Living will - [place];Living will Living will;Healthcare Power of [place];Living will [place];Living will  Does patient want to make changes to medical advance directive? No - Patient declined No - Patient declined No - Patient declined No - Patient declined No - Patient declined No - Patient declined No - Patient declined  Copy of [place] in Chart? Yes - validated most recent copy scanned in chart (See row information) Yes - validated most recent copy scanned in chart (See row information) - Yes - validated most recent copy scanned in chart (See row information) Yes - validated most recent copy scanned in chart (See row information) Yes - validated most recent copy scanned in chart (See row information) Yes - validated most recent copy scanned in chart (See row information)  Would patient like information on creating a medical advance directive? - - - - - - -  Pre-existing out of facility DNR order (yellow form or pink MOST form) - - - - - - -    Current Medications (verified) Outpatient Encounter Medications as of 02/12/2021  Medication Sig   amLODipine (NORVASC) 5 MG tablet Take 1 tablet (5 mg total) by mouth daily.    Calcium Carbonate-Vit D-Min (CALCIUM 1200 PO) Take 1 tablet by mouth daily.   Cholecalciferol (VITAMIN D) 2000 UNITS CAPS Take 1 capsule (2,000 Units total) by mouth daily.   donepezil (ARICEPT) 5 MG tablet Take 1 tablet (5 mg total) by mouth daily.   latanoprost (XALATAN) 0.005 % ophthalmic solution Place 1 drop into both eyes at bedtime.   levothyroxine (SYNTHROID) 25 MCG tablet Take one tablet by mouth once daily before breakfast for thyroid on empty stomach.   Multiple Vitamins-Minerals (SENIOR MULTIVITAMIN PLUS PO) Take by mouth every morning.   olmesartan (BENICAR) 20 MG tablet Take 1 tablet (20 mg total) by mouth daily.   Omega-3 Fatty Acids (FISH OIL) 1000 MG CAPS Take 1,000 capsules by mouth daily.   Polyethyl Glycol-Propyl Glycol (SYSTANE OP) Apply to eye.   simvastatin (ZOCOR) 20 MG tablet Take 1 tablet (20 mg total) by mouth daily. for cholesterol.   [DISCONTINUED] COVID-19 mRNA bivalent vaccine, Pfizer, injection Inject into the muscle.   [DISCONTINUED] influenza vaccine adjuvanted (FLUAD) 0.5 ML injection Inject into the muscle.   No facility-administered encounter medications on file as of 02/12/2021.    Allergies (verified) Patient has no known allergies.   History: Past Medical History:  Diagnosis Date   Arthritis    Glaucoma    Hyperlipidemia    Hypertension    Osteoporosis    Thyroid disease    Past Surgical History:  Procedure Laterality Date   ABDOMINAL HYSTERECTOMY  1965   partial   CHOLECYSTECTOMY  1989   SKIN SURGERY  02/2014   [place] Cell removed from right side of face  Family History  Problem Relation Age of Onset   Cancer Mother    Cancer Father        lung   Heart disease Father    Social History   Socioeconomic History   Marital status: Widowed    Spouse name: Not on file   Number of children: Not on file   Years of education: Not on file   Highest education level: Not on file  Occupational History   Not on file  Tobacco Use    Smoking status: Never   Smokeless tobacco: Never   Tobacco comments:    never used tobacco  Vaping Use   Vaping Use: Never used  Substance and Sexual Activity   Alcohol use: No    Alcohol/week: 0.0 standard drinks   Drug use: No   Sexual activity: Not Currently  Other Topics Concern   Not on file  Social History Narrative   Caffeine: Coffee   Widow, married in 1947   Lives in house, 1 stories, one person, one dog   Current/past profession: Network engineer   Exercise: No   Living Will: Yes   DNR: No, would not like to discuss              Social Determinants of Radio broadcast assistant Strain: Not on file  Food Insecurity: Not on file  Transportation Needs: Not on file  Physical Activity: Not on file  Stress: Not on file  Social Connections: Not on file    Tobacco Counseling Counseling given: Not Answered Tobacco comments: never used tobacco   Clinical Intake:  Pre-visit preparation completed: Yes  Pain : No/denies pain     BMI - recorded: 27.74 Nutritional Risks: None, Nausea/ vomitting/ diarrhea Diabetes: No  How often do you need to have someone help you when you read instructions, pamphlets, or other written materials from your doctor or pharmacy?: 1 - Never  Diabetic?no         Activities of Daily Living In your present state of health, do you have any difficulty performing the following activities: 02/12/2021  Hearing? Y  Vision? N  Difficulty concentrating or making decisions? Y  Walking or climbing stairs? N  Dressing or bathing? N  Doing errands, shopping? Y  Preparing Food and eating ? N  Using the Toilet? N  In the past six months, have you accidently leaked urine? N  Do you have problems with loss of bowel control? N  Managing your Medications? N  Managing your Finances? Y  Comment daughter helps  Housekeeping or managing your Housekeeping? N  Some recent data might be hidden    Patient Care Team: Lauree Chandler, NP as PCP -  General (Geriatric Medicine) [name], MD as Consulting Physician (Oncology) [name], MD as Consulting Physician (Ophthalmology)  Indicate any recent Medical Services you may have received from other than [facility] providers in the past year (date may be approximate).     Assessment:   This is a routine wellness examination for Derian.  Hearing/Vision screen Hearing Screening - Comments:: Patient has no hearing problems Vision Screening - Comments:: Patient has vision problems. Patient sees Dr. Satira Sark. She has not had recent eye exam  Dietary issues and exercise activities discussed: Current Exercise Habits: The patient does not participate in regular exercise at present   Goals Addressed   None    Depression Screen [facility] 2/9 Scores 02/12/2021 04/30/2020 02/08/2020 10/13/2019 04/14/2019 02/04/2019 10/18/2018  PHQ - 2 Score 0 0  0 0 0 0 0    Fall Risk Fall Risk  02/12/2021 11/02/2020 04/30/2020 02/08/2020 10/13/2019  Falls in the past year? 0 0 0 0 0  Number falls in past yr: 0 0 0 0 0  Injury with Fall? 0 0 0 0 0  Comment - - - - -  Risk for fall due to : No Fall Risks No Fall Risks - - -  Follow up Falls evaluation completed Falls evaluation completed - - -    FALL RISK PREVENTION PERTAINING TO THE HOME:  Any stairs in or around the home? No  If so, are there any without handrails? No  Home free of loose throw rugs in walkways, pet beds, electrical cords, etc? Yes  Adequate lighting in your home to reduce risk of falls? Yes   ASSISTIVE DEVICES UTILIZED TO PREVENT FALLS:  Life alert? No  Use of a cane, walker or w/c? No  Grab bars in the bathroom? Yes  Shower chair or bench in shower? Yes  Elevated toilet seat or a handicapped toilet? No   TIMED UP AND GO:  Was the test performed? No .    Cognitive Function: MMSE - Mini Mental State Exam 04/05/2018 02/01/2018 09/26/2016 09/10/2015 07/31/2014  Orientation to time 5 5 5 5 4   Orientation to Place 4 4 4 4 4    Registration 3 3 3 3 3   Attention/ Calculation 5 5 5 5 5   Recall 3 3 2 3 1   Language- name 2 objects 2 2 2 2 2   Language- repeat 0 1 1 1 1   Language- follow 3 step command 3 3 3 3 3   Language- read & follow direction 1 1 1 1 1   Write a sentence 1 1 1 1 1   Copy design 1 1 1 1  0  Total score 28 29 28 29 25      6CIT Screen 02/12/2021 02/08/2020  What Year? 0 points 0 points  What month? 0 points 0 points  What time? 0 points 0 points  Count back from 20 0 points 0 points  Months in reverse 4 points 0 points  Repeat phrase 10 points 10 points  Total Score 14 10    Immunizations Immunization History  Administered Date(s) Administered   Fluad Quad(high Dose 65+) 02/04/2019, 12/25/2020   Influenza,inj,Quad PF,6+ Mos 02/02/2015   Influenza-Unspecified 01/02/2014, 01/11/2016, 12/23/2019   Moderna SARS-COV2 Booster Vaccination 01/20/2020   PFIZER(Purple Top)SARS-COV-2 Vaccination 04/03/2019, 05/14/2019, 01/20/2020   Pfizer Covid-19 Vaccine Bivalent Booster 32yrs & up 12/25/2020   Pneumococcal Conjugate-13 04/24/2014   Pneumococcal-Unspecified 03/15/2013   Tdap 03/24/2012    TDAP status: Up to date  Flu Vaccine status: Up to date  Pneumococcal vaccine status: Up to date  Covid-19 vaccine status: Completed vaccines  Qualifies for Shingles Vaccine? Yes   Zostavax completed No   Shingrix Completed?: No.    Education has been provided regarding the importance of this vaccine. Patient has been advised to call insurance company to determine out of pocket expense if they have not yet received this vaccine. Advised may also receive vaccine at local pharmacy or Health Dept. Verbalized acceptance and understanding.  Screening Tests Health Maintenance  Topic Date Due   Pneumonia Vaccine 84+ Years old (2 - PPSV23 if available, else PCV20) 04/25/2015   Zoster Vaccines- Shingrix (1 of 2) 11/02/2021 (Originally 10/05/1944)   COVID-19 Vaccine (4 - Booster for Pfizer series) 02/19/2021    TETANUS/TDAP  03/24/2022   INFLUENZA VACCINE  Completed  DEXA SCAN  Completed   HPV VACCINES  Aged Out    Health Maintenance  Health Maintenance Due  Topic Date Due   Pneumonia Vaccine 56+ Years old (2 - PPSV23 if available, else PCV20) 04/25/2015    Colorectal cancer screening: No longer required.   Mammogram status: No longer required due to aged out.  Bone Density status: Completed 02/2019. Results reflect: Bone density results: OSTEOPENIA. Repeat every 2 years.  Lung Cancer Screening: (Low Dose CT Chest recommended if Age 82-80 years, 30 pack-year currently smoking OR have quit w/in 15years.) does not qualify.   Lung Cancer Screening Referral: na  Additional Screening:  Hepatitis C Screening: does not qualify;   Vision Screening: Recommended annual ophthalmology exams for early detection of glaucoma and other disorders of the eye. Is the patient up to date with their annual eye exam?  Yes  Who is the provider or what is the name of the office in which the patient attends annual eye exams? [facility] If pt is not established with a provider, would they like to be referred to a provider to establish care? No .   Dental Screening: Recommended annual dental exams for proper oral hygiene  Community Resource Referral / Chronic Care Management: CRR required this visit?  No   CCM required this visit?  No      Plan:     I have personally reviewed and noted the following in the patient's chart:   Medical and social history Use of alcohol, tobacco or illicit drugs  Current medications and supplements including opioid prescriptions.  Functional ability and status Nutritional status Physical activity Advanced directives List of other physicians Hospitalizations, surgeries, and ER visits in previous 12 months Vitals Screenings to include cognitive, depression, and falls Referrals and appointments  In addition, I have reviewed and discussed with patient certain preventive  protocols, quality metrics, and best practice recommendations. A written personalized care plan for preventive services as well as general preventive health recommendations were provided to patient.     Lauree Chandler, NP   02/12/2021    Virtual Visit via Telephone Note  I connected withNAME@ on 02/12/21 at  8:30 AM EST by telephone and verified that I am speaking with the correct person using two identifiers.  Location: Patient: home Provider: [place]    I discussed the limitations, risks, security and privacy concerns of performing an evaluation and management service by telephone and the availability of in person appointments. I also discussed with the patient that there may be a patient responsible charge related to this service. The patient expressed understanding and agreed to proceed.   I discussed the assessment and treatment plan with the patient. The patient was provided an opportunity to ask questions and all were answered. The patient agreed with the plan and demonstrated an understanding of the instructions.   The patient was advised to call back or seek an in-person evaluation if the symptoms worsen or if the condition fails to improve as anticipated.  I provided 15 minutes of non-face-to-face time during this encounter.  [employer]. Harle Battiest Avs printed and mailed

## 2021-02-12 NOTE — Patient Instructions (Signed)
[name] , Thank you for taking time to come for your Medicare Wellness Visit. I appreciate your ongoing commitment to your health goals. Please review the following plan we discussed and let me know if I can assist you in the future.   Screening recommendations/referrals: Colonoscopy aged out Mammogram aged out Bone Density decline Recommended yearly ophthalmology/optometry visit for glaucoma screening and checkup Recommended yearly dental visit for hygiene and checkup  Vaccinations: Influenza vaccine up to date Pneumococcal vaccine up to date Tdap vaccine up to date Shingles vaccine declined.     Advanced directives: on file.  Conditions/risks identified: advance age.   Next appointment: yearly for AWV    Preventive Care 60 Years and Older, Female Preventive care refers to lifestyle choices and visits with your health care provider that can promote health and wellness. What does preventive care include? A yearly physical exam. This is also called an annual well check. Dental exams once or twice a year. Routine eye exams. Ask your health care provider how often you should have your eyes checked. Personal lifestyle choices, including: Daily care of your teeth and gums. Regular physical activity. Eating a healthy diet. Avoiding tobacco and drug use. Limiting alcohol use. Practicing safe sex. Taking low-dose aspirin every day. Taking vitamin and mineral supplements as recommended by your health care provider. What happens during an annual well check? The services and screenings done by your health care provider during your annual well check will depend on your age, overall health, lifestyle risk factors, and family history of disease. Counseling  Your health care provider may ask you questions about your: Alcohol use. Tobacco use. Drug use. Emotional well-being. Home and relationship well-being. Sexual activity. Eating habits. History of falls. Memory and ability  to understand (cognition). Work and work Statistician. Reproductive health. Screening  You may have the following tests or measurements: Height, weight, and BMI. Blood pressure. Lipid and cholesterol levels. These may be checked every 5 years, or more frequently if you are over 70 years old. Skin check. Lung cancer screening. You may have this screening every year starting at age 63 if you have a 30-pack-year history of smoking and currently smoke or have quit within the past 15 years. Fecal occult blood test (FOBT) of the stool. You may have this test every year starting at age 77. Flexible sigmoidoscopy or colonoscopy. You may have a sigmoidoscopy every 5 years or a colonoscopy every 10 years starting at age 69. Hepatitis C blood test. Hepatitis B blood test. Sexually transmitted disease (STD) testing. Diabetes screening. This is done by checking your blood sugar (glucose) after you have not eaten for a while (fasting). You may have this done every 1-3 years. Bone density scan. This is done to screen for osteoporosis. You may have this done starting at age 54. Mammogram. This may be done every 1-2 years. Talk to your health care provider about how often you should have regular mammograms. Talk with your health care provider about your test results, treatment options, and if necessary, the need for more tests. Vaccines  Your health care provider may recommend certain vaccines, such as: Influenza vaccine. This is recommended every year. Tetanus, diphtheria, and acellular pertussis (Tdap, Td) vaccine. You may need a Td booster every 10 years. Zoster vaccine. You may need this after age 46. Pneumococcal 13-valent conjugate (PCV13) vaccine. One dose is recommended after age 6. Pneumococcal polysaccharide (PPSV23) vaccine. One dose is recommended after age 39. Talk to your health care provider about which  screenings and vaccines you need and how often you need them. This information is not  intended to replace advice given to you by your health care provider. Make sure you discuss any questions you have with your health care provider. Document Released: 04/06/2015 Document Revised: 11/28/2015 Document Reviewed: 01/09/2015 Elsevier Interactive Patient Education  2017 [place] Prevention in the Home Falls can cause injuries. They can happen to people of all ages. There are many things you can do to make your home safe and to help prevent falls. What can I do on the outside of my home? Regularly fix the edges of walkways and driveways and fix any cracks. Remove anything that might make you trip as you walk through a door, such as a raised step or threshold. Trim any bushes or trees on the path to your home. Use bright outdoor lighting. Clear any walking paths of anything that might make someone trip, such as rocks or tools. Regularly check to see if handrails are loose or broken. Make sure that both sides of any steps have handrails. Any raised decks and porches should have guardrails on the edges. Have any leaves, snow, or ice cleared regularly. Use sand or salt on walking paths during winter. Clean up any spills in your garage right away. This includes oil or grease spills. What can I do in the bathroom? Use night lights. Install grab bars by the toilet and in the tub and shower. Do not use towel bars as grab bars. Use non-skid mats or decals in the tub or shower. If you need to sit down in the shower, use a plastic, non-slip stool. Keep the floor dry. Clean up any water that spills on the floor as soon as it happens. Remove soap buildup in the tub or shower regularly. Attach bath mats securely with double-sided non-slip rug tape. Do not have throw rugs and other things on the floor that can make you trip. What can I do in the bedroom? Use night lights. Make sure that you have a light by your bed that is easy to reach. Do not use any sheets or blankets that are  too big for your bed. They should not hang down onto the floor. Have a firm chair that has side arms. You can use this for support while you get dressed. Do not have throw rugs and other things on the floor that can make you trip. What can I do in the kitchen? Clean up any spills right away. Avoid walking on wet floors. Keep items that you use a lot in easy-to-reach places. If you need to reach something above you, use a strong step stool that has a grab bar. Keep electrical cords out of the way. Do not use floor polish or wax that makes floors slippery. If you must use wax, use non-skid floor wax. Do not have throw rugs and other things on the floor that can make you trip. What can I do with my stairs? Do not leave any items on the stairs. Make sure that there are handrails on both sides of the stairs and use them. Fix handrails that are broken or loose. Make sure that handrails are as long as the stairways. Check any carpeting to make sure that it is firmly attached to the stairs. Fix any carpet that is loose or worn. Avoid having throw rugs at the top or bottom of the stairs. If you do have throw rugs, attach them to the floor with carpet  tape. Make sure that you have a light switch at the top of the stairs and the bottom of the stairs. If you do not have them, ask someone to add them for you. What else can I do to help prevent falls? Wear shoes that: Do not have high heels. Have rubber bottoms. Are comfortable and fit you well. Are closed at the toe. Do not wear sandals. If you use a stepladder: Make sure that it is fully opened. Do not climb a closed stepladder. Make sure that both sides of the stepladder are locked into place. Ask someone to hold it for you, if possible. Clearly mark and make sure that you can see: Any grab bars or handrails. First and last steps. Where the edge of each step is. Use tools that help you move around (mobility aids) if they are needed. These  include: Canes. Walkers. Scooters. Crutches. Turn on the lights when you go into a dark area. Replace any light bulbs as soon as they burn out. Set up your furniture so you have a clear path. Avoid moving your furniture around. If any of your floors are uneven, fix them. If there are any pets around you, be aware of where they are. Review your medicines with your doctor. Some medicines can make you feel dizzy. This can increase your chance of falling. Ask your doctor what other things that you can do to help prevent falls. This information is not intended to replace advice given to you by your health care provider. Make sure you discuss any questions you have with your health care provider. Document Released: 01/04/2009 Document Revised: 08/16/2015 Document Reviewed: 04/14/2014 Elsevier Interactive Patient Education  2017 [employer].

## 2021-02-12 NOTE — Telephone Encounter (Signed)
Ms. Krystal, [name] are scheduled for a virtual visit with your provider today.    Just as we do with appointments in the office, we must obtain your consent to participate.  Your consent will be active for this visit and any virtual visit you may have with one of our providers in the next 365 days.    If you have a MyChart account, I can also send a copy of this consent to you electronically.  All virtual visits are billed to your insurance company just like a traditional visit in the office.  As this is a virtual visit, video technology does not allow for your provider to perform a traditional examination.  This may limit your provider's ability to fully assess your condition.  If your provider identifies any concerns that need to be evaluated in person or the need to arrange testing such as labs, EKG, etc, we will make arrangements to do so.    Although advances in technology are sophisticated, we cannot ensure that it will always work on either your end or our end.  If the connection with a video visit is poor, we may have to switch to a telephone visit.  With either a video or telephone visit, we are not always able to ensure that we have a secure connection.   I need to obtain your verbal consent now.   Are you willing to proceed with your visit today?   Krystal [name] has provided verbal consent on 02/12/2021 for a virtual visit (video or telephone).   Carroll Kinds, [facility] 02/12/2021  9:16 AM

## 2021-02-25 ENCOUNTER — Other Ambulatory Visit: Payer: Self-pay

## 2021-02-25 ENCOUNTER — Ambulatory Visit (INDEPENDENT_AMBULATORY_CARE_PROVIDER_SITE_OTHER): Payer: Medicare Other | Admitting: Nurse Practitioner

## 2021-02-25 ENCOUNTER — Other Ambulatory Visit: Payer: Self-pay | Admitting: Nurse Practitioner

## 2021-02-25 ENCOUNTER — Other Ambulatory Visit (HOSPITAL_BASED_OUTPATIENT_CLINIC_OR_DEPARTMENT_OTHER): Payer: Self-pay

## 2021-02-25 ENCOUNTER — Encounter: Payer: Self-pay | Admitting: Nurse Practitioner

## 2021-02-25 VITALS — BP 132/74 | HR 80 | Temp 97.1°F | Ht 61.0 in | Wt 135.0 lb

## 2021-02-25 DIAGNOSIS — M5416 Radiculopathy, lumbar region: Secondary | ICD-10-CM

## 2021-02-25 DIAGNOSIS — I10 Essential (primary) hypertension: Secondary | ICD-10-CM

## 2021-02-25 DIAGNOSIS — R634 Abnormal weight loss: Secondary | ICD-10-CM

## 2021-02-25 DIAGNOSIS — E039 Hypothyroidism, unspecified: Secondary | ICD-10-CM | POA: Diagnosis not present

## 2021-02-25 DIAGNOSIS — E7800 Pure hypercholesterolemia, unspecified: Secondary | ICD-10-CM

## 2021-02-25 DIAGNOSIS — F01B0 Vascular dementia, moderate, without behavioral disturbance, psychotic disturbance, mood disturbance, and anxiety: Secondary | ICD-10-CM

## 2021-02-25 MED ORDER — DONEPEZIL HCL 10 MG PO TABS
10.0000 mg | ORAL_TABLET | Freq: Every day | ORAL | 3 refills | Status: DC
  Filled 2021-02-25: qty 30, 30d supply, fill #0
  Filled 2021-04-09: qty 30, 30d supply, fill #1

## 2021-02-25 NOTE — Progress Notes (Signed)
Careteam: Patient Care Team: Lauree Chandler, NP as PCP - General (Geriatric Medicine) [name], MD as Consulting Physician (Oncology) [name], MD as Consulting Physician (Ophthalmology)  PLACE OF SERVICE:  [place] Directive information Does Patient Have a Medical Advance Directive?: Yes, Type of Advance Directive: [place];Living will, Does patient want to make changes to medical advance directive?: No - Patient declined  No Known Allergies  Chief Complaint  Patient presents with   Medical Management of Chronic Issues    3 month follow-up. Discuss need for pneu and covid #4 vaccine or postpone/exclude if patient refuses. Here with [name], daughter.      HPI: Patient is a 87 y.o. female for follow up.   Started aricept at last visit due to slow progression of memory loss. She was living alone and continues to do so. Does not dive.  She is very to herself- always has been. Does not wish to do any day program Long term goal is someone will come to live with her.   She has lost 10 lbs since last visit. Uses frozen meals that she microwaves. Does not eat 3 meals a day. Reports she only eats when she is hungry. Daughter states she only eats 1 meal a day.  Daughter reports she is very inactive. She will not drink protein drinks.   Tsh was stable on last labs, continues on synthroid 25 mcg.   Htn- continue on norvasc 5 mg daily with benicar 20 mg  Hyperlipidemia- LDL at goal in Aug, continues on zocor 20 mg daily   Review of Systems:  Review of Systems  Constitutional:  Negative for chills, fever and weight loss.  HENT:  Negative for tinnitus.   Respiratory:  Negative for cough, sputum production and shortness of breath.   Cardiovascular:  Negative for chest pain, palpitations and leg swelling.  Gastrointestinal:  Negative for abdominal pain, constipation, diarrhea and heartburn.  Genitourinary:  Negative for dysuria,  frequency and urgency.  Musculoskeletal:  Positive for back pain. Negative for falls, joint pain and myalgias.  Skin: Negative.   Neurological:  Negative for dizziness and headaches.  Psychiatric/Behavioral:  Positive for memory loss. Negative for depression. The patient does not have insomnia.    Past Medical History:  Diagnosis Date   Arthritis    Glaucoma    Hyperlipidemia    Hypertension    Osteoporosis    Thyroid disease    Past Surgical History:  Procedure Laterality Date   ABDOMINAL HYSTERECTOMY  1965   partial   CHOLECYSTECTOMY  1989   SKIN SURGERY  02/2014   Basel Cell removed from right side of face    Social History:   reports that she has never smoked. She has never used smokeless tobacco. She reports that she does not drink alcohol and does not use drugs.  Family History  Problem Relation Age of Onset   Cancer Mother    Cancer Father        lung   Heart disease Father     Medications: Patient's Medications  New Prescriptions   No medications on file  Previous Medications   AMLODIPINE (NORVASC) 5 MG TABLET    Take 1 tablet (5 mg total) by mouth daily.   CALCIUM CARBONATE-VIT D-MIN (CALCIUM 1200 PO)    Take 1 tablet by mouth daily.   CHOLECALCIFEROL (VITAMIN D) 2000 UNITS CAPS    Take 1 capsule (2,000 Units total) by mouth daily.  DONEPEZIL (ARICEPT) 5 MG TABLET    Take 1 tablet (5 mg total) by mouth daily.   LATANOPROST (XALATAN) 0.005 % OPHTHALMIC SOLUTION    Place 1 drop into both eyes at bedtime.   LEVOTHYROXINE (SYNTHROID) 25 MCG TABLET    Take one tablet by mouth once daily before breakfast for thyroid on empty stomach.   MULTIPLE VITAMINS-MINERALS (SENIOR MULTIVITAMIN PLUS PO)    Take by mouth every morning.   OLMESARTAN (BENICAR) 20 MG TABLET    Take 1 tablet (20 mg total) by mouth daily.   OMEGA-3 FATTY ACIDS (FISH OIL) 1000 MG CAPS    Take 1,000 capsules by mouth daily.   POLYETHYL GLYCOL-PROPYL GLYCOL (SYSTANE OP)    Apply to eye.   SIMVASTATIN  (ZOCOR) 20 MG TABLET    Take 1 tablet (20 mg total) by mouth daily. for cholesterol.  Modified Medications   No medications on file  Discontinued Medications   No medications on file    Physical Exam:  Vitals:   02/25/21 1034  BP: 132/74  Pulse: 80  Temp: (!) 97.1 F (36.2 C)  TempSrc: Temporal  SpO2: 97%  Weight: 135 lb (61.2 kg)  Height: '5\' 1"'  (1.549 m)   Body mass index is 25.51 kg/m. Wt Readings from Last 3 Encounters:  02/25/21 135 lb (61.2 kg)  11/02/20 146 lb 12.8 oz (66.6 kg)  04/30/20 149 lb 12.8 oz (67.9 kg)    Physical Exam Constitutional:      General: She is not in acute distress.    Appearance: She is well-developed. She is not diaphoretic.  HENT:     Head: Normocephalic and atraumatic.     Mouth/Throat:     Pharynx: No oropharyngeal exudate.  Eyes:     Conjunctiva/sclera: Conjunctivae normal.     Pupils: Pupils are equal, round, and reactive to light.  Cardiovascular:     Rate and Rhythm: Normal rate and regular rhythm.     Heart sounds: Normal heart sounds.  Pulmonary:     Effort: Pulmonary effort is normal.     Breath sounds: Normal breath sounds.  Abdominal:     General: Bowel sounds are normal.     Palpations: Abdomen is soft.  Musculoskeletal:     Cervical back: Normal range of motion and neck supple.     Right lower leg: No edema.     Left lower leg: No edema.  Skin:    General: Skin is warm and dry.  Neurological:     Mental Status: She is confused.  Psychiatric:        Mood and Affect: Mood normal.        Cognition and Memory: Cognition is impaired. Memory is impaired. She exhibits impaired recent memory and impaired remote memory.   Labs reviewed: Basic Metabolic Panel: Recent Labs    04/30/20 1227 11/02/20 1519  NA 139 141  K 4.2 3.8  CL 105 108  CO2 24 27  GLUCOSE 85 104*  BUN 20 17  CREATININE 1.12* 0.94  CALCIUM 9.4 9.3  TSH 4.11 3.91   Liver Function Tests: Recent Labs    04/30/20 1227 11/02/20 1519  AST 20  18  ALT 8 9  BILITOT 0.9 0.6  PROT 6.3 6.0*   No results for input(s): LIPASE, AMYLASE in the last 8760 hours. No results for input(s): AMMONIA in the last 8760 hours. CBC: Recent Labs    04/30/20 1227 11/02/20 1519  WBC 10.5 9.7  NEUTROABS 4,778 3,851  HGB 13.5 13.1  HCT 39.6 41.0  MCV 93.4 96.9  PLT 222 206   Lipid Panel: Recent Labs    04/30/20 1227 11/02/20 1519  CHOL 156 150  HDL 65 59  LDLCALC 77 72  TRIG 68 100  CHOLHDL 2.4 2.5   TSH: Recent Labs    04/30/20 1227 11/02/20 1519  TSH 4.11 3.91   A1C: Lab Results  Component Value Date   HGBA1C 5.5 11/02/2020     Assessment/Plan 1. Weight loss 10 lb weight loss since last OV 3 months ago. Reports decrease in appetite. Daughter is encouraged her to increase nutrition.  -suspect this to worsen with dementia progression.  - CMP with eGFR([facility]) - TSH  2. Essential hypertension, benign -Blood pressure well controlled Continue current medications Recheck metabolic panel - CMP with eGFR([facility])  3. Moderate vascular dementia without behavioral disturbance, psychotic disturbance, mood disturbance, or anxiety -progressive decline, weight loss noted. She lives alone and daughter helps her. Her home is set up to have someone live in with her if needed.  -will increase aricept to help slow disease progression.  - donepezil (ARICEPT) 10 MG tablet; Take 1 tablet (10 mg total) by mouth daily.  Dispense: 30 tablet; Refill: 3  4. Lumbar back pain with radiculopathy affecting right lower extremity -ongoing, daughter reports she will not take tylenol which helps.   5. Acquired hypothyroidism -will follow up TSh due to significant weight loss. Continues on synthroid 25 mcg.   6. Pure hypercholesterolemia -continues on zocor 60 mg daily    Next appt: 3 months.  Carlos American. [place], [facility] [phone]

## 2021-02-25 NOTE — Patient Instructions (Signed)
encouraged to liberalize diet. To have protein supplement in addition to smallest meal of the day. 3 meals a day.   To increase aricept to 10 mg daily, New Rx has been sent to pharmacy.

## 2021-02-26 LAB — COMPLETE METABOLIC PANEL WITH GFR
AG Ratio: 2.2 (calc) (ref 1.0–2.5)
ALT: 8 U/L (ref 6–29)
AST: 18 U/L (ref 10–35)
Albumin: 4.1 g/dL (ref 3.6–5.1)
Alkaline phosphatase (APISO): 60 U/L (ref 37–153)
BUN/Creatinine Ratio: 17 (calc) (ref 6–22)
BUN: 18 mg/dL (ref 7–25)
CO2: 26 mmol/L (ref 20–32)
Calcium: 9.2 mg/dL (ref 8.6–10.4)
Chloride: 107 mmol/L (ref 98–110)
Creat: 1.09 mg/dL — ABNORMAL HIGH (ref 0.60–0.95)
Globulin: 1.9 g/dL (calc) (ref 1.9–3.7)
Glucose, Bld: 111 mg/dL — ABNORMAL HIGH (ref 65–99)
Potassium: 3.9 mmol/L (ref 3.5–5.3)
Sodium: 142 mmol/L (ref 135–146)
Total Bilirubin: 0.7 mg/dL (ref 0.2–1.2)
Total Protein: 6 g/dL — ABNORMAL LOW (ref 6.1–8.1)
eGFR: 47 mL/min/{1.73_m2} — ABNORMAL LOW (ref >=60)

## 2021-02-26 LAB — TSH: TSH: 3.56 mIU/L (ref 0.40–4.50)

## 2021-02-27 ENCOUNTER — Encounter: Payer: Self-pay | Admitting: Nurse Practitioner

## 2021-04-09 ENCOUNTER — Other Ambulatory Visit (HOSPITAL_BASED_OUTPATIENT_CLINIC_OR_DEPARTMENT_OTHER): Payer: Self-pay

## 2021-06-04 ENCOUNTER — Other Ambulatory Visit: Payer: Self-pay | Admitting: Nurse Practitioner

## 2021-06-04 DIAGNOSIS — G3184 Mild cognitive impairment, so stated: Secondary | ICD-10-CM

## 2021-06-10 ENCOUNTER — Ambulatory Visit: Payer: Medicare Other | Admitting: Nurse Practitioner

## 2021-06-10 ENCOUNTER — Encounter: Payer: Self-pay | Admitting: Nurse Practitioner

## 2021-06-10 ENCOUNTER — Other Ambulatory Visit: Payer: Self-pay

## 2021-06-10 ENCOUNTER — Ambulatory Visit (INDEPENDENT_AMBULATORY_CARE_PROVIDER_SITE_OTHER): Payer: Medicare Other | Admitting: Nurse Practitioner

## 2021-06-10 VITALS — BP 118/66 | HR 86 | Temp 97.1°F | Ht 61.0 in | Wt 143.0 lb

## 2021-06-10 DIAGNOSIS — F01B0 Vascular dementia, moderate, without behavioral disturbance, psychotic disturbance, mood disturbance, and anxiety: Secondary | ICD-10-CM

## 2021-06-10 DIAGNOSIS — E039 Hypothyroidism, unspecified: Secondary | ICD-10-CM

## 2021-06-10 DIAGNOSIS — E7800 Pure hypercholesterolemia, unspecified: Secondary | ICD-10-CM

## 2021-06-10 DIAGNOSIS — I10 Essential (primary) hypertension: Secondary | ICD-10-CM | POA: Diagnosis not present

## 2021-06-10 DIAGNOSIS — W19XXXA Unspecified fall, initial encounter: Secondary | ICD-10-CM

## 2021-06-10 DIAGNOSIS — S51812A Laceration without foreign body of left forearm, initial encounter: Secondary | ICD-10-CM | POA: Diagnosis not present

## 2021-06-10 NOTE — Patient Instructions (Signed)
Consider adding namenda to regimen ?Make sure to eating more protein.  ? ? ?

## 2021-06-10 NOTE — Progress Notes (Signed)
? ? ?Careteam: ?Patient Care Team: ?Lauree Chandler, NP as PCP - General (Geriatric Medicine) ?[name], MD as Consulting Physician (Oncology) ?[name], MD as Consulting Physician (Ophthalmology) ? ?PLACE OF SERVICE:  ?[facility]  ?Advanced Directive information ?Does Patient Have a Medical Advance Directive?: Yes, Type of Advance Directive: [place];Living will, Does patient want to make changes to medical advance directive?: No - Patient declined ? ?No Known Allergies ? ?Chief Complaint  ?Patient presents with  ? Medical Management of Chronic Issues  ?  3 month follow-up. Discuss need for pneumonia and covid vaccines ([facility] verified).Patient denies receiving any vaccines since last visit. Here with daughter [name]. Per [name] weight was entered wrong at last visit, patient weighed at home and weight was 149 (mychart message was sent). Patient fell this morning and needs left forearm area examined. May need entire left side examined to assure no additional injury related to fall.  ?  ? ? ? ?HPI: Patient is a 88 y.o. female for follow up.  ?Daughter came over to get her for appt and found her in the floor.  ?She does not remember what happened.  ?Hit her right hand and left arm.  ?She lives alone.  ?She has fallen twice In the last 3 months.  ? ?Weight was wrong at last visit. Her home weights were in 140 lbs.  ? ?Cr was up at last visit, "should drink more than I do" ? ?TSH at goal in December.  ? ?Daughter does not feels like aricept is helping. Question if contributing to falls.  ?Both times falls are after shower but not in bathroom. No injury  ? ?Review of Systems:  ?Review of Systems  ?Unable to perform ROS: Dementia  ? ?Past Medical History:  ?Diagnosis Date  ? Arthritis   ? Glaucoma   ? Hyperlipidemia   ? Hypertension   ? Osteoporosis   ? Thyroid disease   ? ?Past Surgical History:  ?Procedure Laterality Date  ? ABDOMINAL HYSTERECTOMY  1965  ? partial  ? CHOLECYSTECTOMY   1989  ? SKIN SURGERY  02/2014  ? Basel Cell removed from right side of face   ? ?Social History: ?  reports that she has never smoked. She has never used smokeless tobacco. She reports that she does not drink alcohol and does not use drugs. ? ?Family History  ?Problem Relation Age of Onset  ? Cancer Mother   ? Cancer Father   ?     lung  ? Heart disease Father   ? ? ?Medications: ?Patient's Medications  ?New Prescriptions  ? No medications on file  ?Previous Medications  ? AMLODIPINE (NORVASC) 5 MG TABLET    Take 1 tablet (5 mg total) by mouth daily.  ? CALCIUM CARBONATE-VIT D-MIN (CALCIUM 1200 PO)    Take 1 tablet by mouth daily.  ? CHOLECALCIFEROL (VITAMIN D) 2000 UNITS CAPS    Take 1 capsule (2,000 Units total) by mouth daily.  ? DONEPEZIL (ARICEPT) 5 MG TABLET    Take 5 mg by mouth at bedtime.  ? LATANOPROST (XALATAN) 0.005 % OPHTHALMIC SOLUTION    Place 1 drop into both eyes at bedtime.  ? LEVOTHYROXINE (SYNTHROID) 25 MCG TABLET    Take one tablet by mouth once daily before breakfast for thyroid on empty stomach.  ? MULTIPLE VITAMINS-MINERALS (SENIOR MULTIVITAMIN PLUS PO)    Take by mouth every morning.  ? OLMESARTAN (BENICAR) 20 MG TABLET    Take 1 tablet (  20 mg total) by mouth daily.  ? OMEGA-3 FATTY ACIDS (FISH OIL) 1000 MG CAPS    Take 1,000 capsules by mouth daily.  ? POLYETHYL GLYCOL-PROPYL GLYCOL (SYSTANE OP)    Apply 1 drop to eye as needed.  ? SIMVASTATIN (ZOCOR) 20 MG TABLET    Take 1 tablet (20 mg total) by mouth daily. for cholesterol.  ?Modified Medications  ? No medications on file  ?Discontinued Medications  ? DONEPEZIL (ARICEPT) 10 MG TABLET    Take 1 tablet (10 mg total) by mouth daily.  ? ? ?Physical Exam: ? ?Vitals:  ? 06/10/21 1404  ?BP: 118/66  ?Pulse: 86  ?Temp: (!) 97.1 ?F (36.2 ?C)  ?TempSrc: Temporal  ?SpO2: 95%  ?Weight: 143 lb (64.9 kg)  ?Height: '5\' 1"'  (1.549 m)  ? ?Body mass index is 27.02 kg/m?. ?Wt Readings from Last 3 Encounters:  ?06/10/21 143 lb (64.9 kg)  ?02/25/21 135 lb (61.2  kg)  ?11/02/20 146 lb 12.8 oz (66.6 kg)  ? ? ?Physical Exam ?Constitutional:   ?   General: She is not in acute distress. ?   Appearance: She is well-developed. She is not diaphoretic.  ?HENT:  ?   Head: Normocephalic and atraumatic.  ?   Mouth/Throat:  ?   Pharynx: No oropharyngeal exudate.  ?Eyes:  ?   Conjunctiva/sclera: Conjunctivae normal.  ?   Pupils: Pupils are equal, round, and reactive to light.  ?Cardiovascular:  ?   Rate and Rhythm: Normal rate and regular rhythm.  ?   Heart sounds: Normal heart sounds.  ?Pulmonary:  ?   Effort: Pulmonary effort is normal.  ?   Breath sounds: Normal breath sounds.  ?Abdominal:  ?   General: Bowel sounds are normal.  ?   Palpations: Abdomen is soft.  ?Musculoskeletal:  ?   Cervical back: Normal range of motion and neck supple.  ?   Right lower leg: No edema.  ?   Left lower leg: No edema.  ?Skin: ?   General: Skin is warm and dry.  ?   Findings: Bruising (to right hand) and erythema (with skin tears noted to left forearm) present.  ?Neurological:  ?   Mental Status: She is alert. Mental status is at baseline.  ?   Motor: Weakness present.  ?   Gait: Gait abnormal.  ?Psychiatric:     ?   Mood and Affect: Mood normal.  ? ? ?Labs reviewed: ?Basic Metabolic Panel: ?Recent Labs  ?  11/02/20 ?1519 02/25/21 ?1102  ?NA 141 142  ?K 3.8 3.9  ?CL 108 107  ?CO2 27 26  ?GLUCOSE 104* 111*  ?BUN 17 18  ?CREATININE 0.94 1.09*  ?CALCIUM 9.3 9.2  ?TSH 3.91 3.56  ? ?Liver Function Tests: ?Recent Labs  ?  11/02/20 ?1519 02/25/21 ?1102  ?AST 18 18  ?ALT 9 8  ?BILITOT 0.6 0.7  ?PROT 6.0* 6.0*  ? ?No results for input(s): LIPASE, AMYLASE in the last 8760 hours. ?No results for input(s): AMMONIA in the last 8760 hours. ?CBC: ?Recent Labs  ?  11/02/20 ?1519  ?WBC 9.7  ?NEUTROABS 3,851  ?HGB 13.1  ?HCT 41.0  ?MCV 96.9  ?PLT 206  ? ?Lipid Panel: ?Recent Labs  ?  11/02/20 ?1519  ?CHOL 150  ?HDL 59  ?[place] 72  ?TRIG 100  ?CHOLHDL 2.5  ? ?TSH: ?Recent Labs  ?  11/02/20 ?1519 02/25/21 ?1102  ?TSH 3.91  3.56  ? ?A1C: ?Lab Results  ?Component Value Date  ?  HGBA1C 5.5 11/02/2020  ? ? ? ?Assessment/Plan ?1. Essential hypertension, benign ?Blood pressure well controlled ?Continue current medications ?Recheck metabolic panel ?- CBC with Differential/Platelet ?- CMP with eGFR([facility]) ? ?2. Acquired hypothyroidism ?-tsh at goal on last labs, continues on synthroid 25 mcg ? ?3. Pure hypercholesterolemia ?-continues on simvastatin.  ?- CMP with eGFR([facility]) ?- Lipid panel ? ?4. Moderate vascular dementia without behavioral disturbance, psychotic disturbance, mood disturbance, or anxiety ?-Stable, slow progressive decline noted, has support of daughter however she lives a one. Recommended more supervision due to recent falls.  ?-continues on aricept, discussed adding namenda to regimen to slow progression.  ? ?5. Skin tear of left forearm without complication, initial encounter ?-cleaned with saline in office and xeroform applied, to change dressing with xerofrom and gauze every other day (sooner if needed) until healed ? ?6. Fall, initial encounter ?-increase supervision recommended and fall precautions discussed.  ? ? ?Return in about 4 months (around 10/10/2021) for routine follow up . ?Carlos American. Dewaine Oats, AGNP ? ?[facility] ?[phone]  ?

## 2021-06-11 ENCOUNTER — Other Ambulatory Visit (HOSPITAL_BASED_OUTPATIENT_CLINIC_OR_DEPARTMENT_OTHER): Payer: Self-pay

## 2021-06-11 LAB — CBC WITH DIFFERENTIAL/PLATELET
Absolute Monocytes: 660 cells/uL (ref 200–950)
Basophils Absolute: 40 cells/uL (ref 0–200)
Basophils Relative: 0.3 %
Eosinophils Absolute: 13 cells/uL — ABNORMAL LOW (ref 15–500)
Eosinophils Relative: 0.1 %
HCT: 41.4 % (ref 35.0–45.0)
Hemoglobin: 13.9 g/dL (ref 11.7–15.5)
Lymphs Abs: 2851 cells/uL (ref 850–3900)
MCH: 31.3 pg (ref 27.0–33.0)
MCHC: 33.6 g/dL (ref 32.0–36.0)
MCV: 93.2 fL (ref 80.0–100.0)
MPV: 10.1 fL (ref 7.5–12.5)
Monocytes Relative: 5 %
Neutro Abs: 9636 cells/uL — ABNORMAL HIGH (ref 1500–7800)
Neutrophils Relative %: 73 %
Platelets: 207 10*3/uL (ref 140–400)
RBC: 4.44 10*6/uL (ref 3.80–5.10)
RDW: 12.3 % (ref 11.0–15.0)
Total Lymphocyte: 21.6 %
WBC: 13.2 10*3/uL — ABNORMAL HIGH (ref 3.8–10.8)

## 2021-06-11 LAB — COMPLETE METABOLIC PANEL WITH GFR
AG Ratio: 2.2 (calc) (ref 1.0–2.5)
ALT: 9 U/L (ref 6–29)
AST: 19 U/L (ref 10–35)
Albumin: 4.2 g/dL (ref 3.6–5.1)
Alkaline phosphatase (APISO): 57 U/L (ref 37–153)
BUN/Creatinine Ratio: 19 (calc) (ref 6–22)
BUN: 23 mg/dL (ref 7–25)
CO2: 23 mmol/L (ref 20–32)
Calcium: 9.7 mg/dL (ref 8.6–10.4)
Chloride: 105 mmol/L (ref 98–110)
Creat: 1.24 mg/dL — ABNORMAL HIGH (ref 0.60–0.95)
Globulin: 1.9 g/dL (calc) (ref 1.9–3.7)
Glucose, Bld: 111 mg/dL (ref 65–139)
Potassium: 4.1 mmol/L (ref 3.5–5.3)
Sodium: 140 mmol/L (ref 135–146)
Total Bilirubin: 1 mg/dL (ref 0.2–1.2)
Total Protein: 6.1 g/dL (ref 6.1–8.1)
eGFR: 41 mL/min/{1.73_m2} — ABNORMAL LOW (ref >=60)

## 2021-06-11 LAB — LIPID PANEL
Cholesterol: 142 mg/dL (ref <200)
HDL: 66 mg/dL (ref >=50)
LDL Cholesterol (Calc): 62 mg/dL (calc)
Non-HDL Cholesterol (Calc): 76 mg/dL (calc) (ref <130)
Total CHOL/HDL Ratio: 2.2 (calc) (ref <5.0)
Triglycerides: 65 mg/dL (ref <150)

## 2021-06-25 ENCOUNTER — Encounter: Payer: Self-pay | Admitting: Nurse Practitioner

## 2021-06-25 MED ORDER — DONEPEZIL HCL 10 MG PO TABS: 10.0000 mg | ORAL_TABLET | Freq: Every day | ORAL | 1 refills | Status: DC

## 2021-06-25 MED ORDER — DONEPEZIL HCL 5 MG PO TABS: 5.0000 mg | ORAL_TABLET | Freq: Every day | ORAL | 1 refills | Status: DC

## 2021-06-25 NOTE — Telephone Encounter (Signed)
Lets have her increase aricept to 10 mg by mouth daily first and then we can add namenda.  ?

## 2021-06-25 NOTE — Telephone Encounter (Signed)
What about the Namenda? ?Patient is requesting. ? ? ?OV Note Dated 06/10/2021: ?4. Moderate vascular dementia without behavioral disturbance, psychotic disturbance, mood disturbance, or anxiety ?-Stable, slow progressive decline noted, has support of daughter however she lives a one. Recommended more supervision due to recent falls.  ?-continues on aricept, discussed adding namenda to regimen to slow progression ? ?

## 2021-06-25 NOTE — Telephone Encounter (Signed)
Pended Rx and sent to [facility] for [employer].  ?

## 2021-07-03 MED ORDER — DONEPEZIL HCL 10 MG PO TABS
10.0000 mg | ORAL_TABLET | Freq: Every day | ORAL | 1 refills | Status: DC
Start: 2021-07-03 — End: 2021-11-22

## 2021-07-03 NOTE — Addendum Note (Signed)
Addended by: Rafael Bihari A on: 07/03/2021 09:55 AM ? ? Modules accepted: Orders ? ?

## 2021-07-31 DIAGNOSIS — H18593 Other hereditary corneal dystrophies, bilateral: Secondary | ICD-10-CM | POA: Diagnosis not present

## 2021-07-31 DIAGNOSIS — H52203 Unspecified astigmatism, bilateral: Secondary | ICD-10-CM | POA: Diagnosis not present

## 2021-07-31 DIAGNOSIS — H401134 Primary open-angle glaucoma, bilateral, indeterminate stage: Secondary | ICD-10-CM | POA: Diagnosis not present

## 2021-07-31 DIAGNOSIS — H26491 Other secondary cataract, right eye: Secondary | ICD-10-CM | POA: Diagnosis not present

## 2021-09-07 ENCOUNTER — Other Ambulatory Visit: Payer: Self-pay | Admitting: Nurse Practitioner

## 2021-10-21 ENCOUNTER — Encounter: Payer: Self-pay | Admitting: Nurse Practitioner

## 2021-10-21 ENCOUNTER — Ambulatory Visit (INDEPENDENT_AMBULATORY_CARE_PROVIDER_SITE_OTHER): Payer: Medicare Other | Admitting: Nurse Practitioner

## 2021-10-21 VITALS — BP 130/74 | HR 89 | Temp 97.1°F | Ht 61.0 in | Wt 135.0 lb

## 2021-10-21 DIAGNOSIS — E039 Hypothyroidism, unspecified: Secondary | ICD-10-CM

## 2021-10-21 DIAGNOSIS — R600 Localized edema: Secondary | ICD-10-CM | POA: Diagnosis not present

## 2021-10-21 DIAGNOSIS — F01B0 Vascular dementia, moderate, without behavioral disturbance, psychotic disturbance, mood disturbance, and anxiety: Secondary | ICD-10-CM | POA: Diagnosis not present

## 2021-10-21 DIAGNOSIS — E7800 Pure hypercholesterolemia, unspecified: Secondary | ICD-10-CM

## 2021-10-21 DIAGNOSIS — R634 Abnormal weight loss: Secondary | ICD-10-CM

## 2021-10-21 DIAGNOSIS — I10 Essential (primary) hypertension: Secondary | ICD-10-CM | POA: Diagnosis not present

## 2021-10-21 MED ORDER — MEMANTINE HCL 28 X 5 MG & 21 X 10 MG PO TABS: ORAL_TABLET | ORAL | 12 refills | Status: DC

## 2021-10-21 NOTE — Patient Instructions (Addendum)
-  encouraged to elevate legs above level of heart as tolerates, low sodium diet, compression hose/socks as tolerates (on in am, off in pm)  -increase protein in diet  -to use protein supplement daily- in addition to meal.   Encourage activity.

## 2021-10-21 NOTE — Progress Notes (Signed)
Careteam: Patient Care Team: Lauree Chandler, NP as PCP - General (Geriatric Medicine) [name], MD as Consulting Physician (Oncology) [name], MD as Consulting Physician (Ophthalmology)  PLACE OF SERVICE:  [place] Directive information Does Patient Have a Medical Advance Directive?: Yes, Type of Advance Directive: [place];Living will, Does patient want to make changes to medical advance directive?: No - Patient declined  No Known Allergies  Chief Complaint  Patient presents with   Medical Management of Chronic Issues    4 month follow-up. Discuss need for pneumonia vaccine and additional covide boosters. Patient denies receiving any vaccines since last visit, [facility] verified. Decrease in appetite. Aricept seems ineffective, per daughter. Bilateral foot swelling. Here with [name], daughter.       HPI: Patient is a 88 y.o. female for routine follow up.   She lives alone. She has someone come by 4 days a week for 4 hours. On the other days her daughter is there.  She gets breakfast and lunch from caregiver. Daughter has to call her and remind her to eat dinner.  She is now eating more now than what she was.  She no longer uses the stove or microwave (has forgotten how to do so)   Blood pressure controlled on current regimen.   Legs swelling more- she sits most the day She is very inactive and has been most her life.    Review of Systems:  Review of Systems  Unable to perform ROS: Dementia   Past Medical History:  Diagnosis Date   Arthritis    Glaucoma    Hyperlipidemia    Hypertension    Osteoporosis    Thyroid disease    Past Surgical History:  Procedure Laterality Date   ABDOMINAL HYSTERECTOMY  1965   partial   CHOLECYSTECTOMY  1989   SKIN SURGERY  02/2014   Basel Cell removed from right side of face    Social History:   reports that she has never smoked. She has never used smokeless tobacco. She reports  that she does not drink alcohol and does not use drugs.  Family History  Problem Relation Age of Onset   Cancer Mother    Cancer Father        lung   Heart disease Father     Medications: Patient's Medications  New Prescriptions   No medications on file  Previous Medications   AMLODIPINE (NORVASC) 5 MG TABLET    TAKE 1 TABLET BY MOUTH  DAILY   CALCIUM CARBONATE-VIT D-MIN (CALCIUM 1200 PO)    Take 1 tablet by mouth daily.   CHOLECALCIFEROL (VITAMIN D) 2000 UNITS CAPS    Take 1 capsule (2,000 Units total) by mouth daily.   DONEPEZIL (ARICEPT) 10 MG TABLET    Take 1 tablet (10 mg total) by mouth at bedtime.   LEVOTHYROXINE (SYNTHROID) 25 MCG TABLET    TAKE 1 TABLET BY MOUTH ONCE DAILY BEFORE BREAKFAST ON  AN EMPTY STOMACH FOR  THYROID   MULTIPLE VITAMINS-MINERALS (SENIOR MULTIVITAMIN PLUS PO)    Take by mouth every morning.   OLMESARTAN (BENICAR) 20 MG TABLET    TAKE 1 TABLET BY MOUTH  DAILY   OMEGA-3 FATTY ACIDS (FISH OIL) 1000 MG CAPS    Take 1,000 capsules by mouth daily.   POLYETHYL GLYCOL-PROPYL GLYCOL (SYSTANE OP)    Apply 1 drop to eye as needed.   SIMVASTATIN (ZOCOR) 20 MG TABLET    TAKE 1 TABLET  BY MOUTH  DAILY FOR CHOLESTEROL  Modified Medications   No medications on file  Discontinued Medications   LATANOPROST (XALATAN) 0.005 % OPHTHALMIC SOLUTION    Place 1 drop into both eyes at bedtime.    Physical Exam:  Vitals:   10/21/21 1010  BP: 130/74  Pulse: 89  Temp: (!) 97.1 F (36.2 C)  TempSrc: Temporal  SpO2: 96%  Weight: 135 lb (61.2 kg)  Height: _0  (1.549 m)   Body mass index is 25.51 kg/m. Wt Readings from Last 3 Encounters:  10/21/21 135 lb (61.2 kg)  06/10/21 143 lb (64.9 kg)  02/25/21 135 lb (61.2 kg)    Physical Exam Constitutional:      General: She is not in acute distress.    Appearance: She is well-developed. She is not diaphoretic.  HENT:     Head: Normocephalic and atraumatic.     Mouth/Throat:     Pharynx: No oropharyngeal exudate.   Eyes:     Conjunctiva/sclera: Conjunctivae normal.     Pupils: Pupils are equal, round, and reactive to light.  Cardiovascular:     Rate and Rhythm: Normal rate and regular rhythm.     Heart sounds: Normal heart sounds.  Pulmonary:     Effort: Pulmonary effort is normal.     Breath sounds: Normal breath sounds.  Abdominal:     General: Bowel sounds are normal.     Palpations: Abdomen is soft.  Musculoskeletal:     Cervical back: Normal range of motion and neck supple.     Right lower leg: Edema (2+) present.     Left lower leg: Edema (2+) present.  Skin:    General: Skin is warm and dry.  Neurological:     Mental Status: She is alert.  Psychiatric:        Mood and Affect: Mood normal.     Labs reviewed: Basic Metabolic Panel: Recent Labs    11/02/20 1519 02/25/21 1102 06/10/21 1440  NA 141 142 140  K 3.8 3.9 4.1  CL 108 107 105  CO2 _1 GLUCOSE 104* 111* 111  BUN _2 CREATININE 0.94 1.09* 1.24*  CALCIUM 9.3 9.2 9.7  TSH 3.91 3.56  --    Liver Function Tests: Recent Labs    11/02/20 1519 02/25/21 1102 06/10/21 1440  AST _3 ALT _4 BILITOT 0.6 0.7 1.0  PROT 6.0* 6.0* 6.1   No results for input(s): "LIPASE", "AMYLASE" in the last 8760 hours. No results for input(s): "AMMONIA" in the last 8760 hours. CBC: Recent Labs    11/02/20 1519 06/10/21 1440  WBC 9.7 13.2*  NEUTROABS 3,851 9,636*  HGB 13.1 13.9  HCT 41.0 41.4  MCV 96.9 93.2  PLT 206 207   Lipid Panel: Recent Labs    11/02/20 1519 06/10/21 1440  CHOL 150 142  HDL 59 66  LDLCALC 72 62  TRIG 100 65  CHOLHDL 2.5 2.2   TSH: Recent Labs    11/02/20 1519 02/25/21 1102  TSH 3.91 3.56   A1C: Lab Results  Component Value Date   HGBA1C 5.5 11/02/2020     Assessment/Plan 1. Essential hypertension, benign -stable, continues on norvasc and benicar - CBC with Differential/Platelet - CMP with eGFR([facility])  2. Acquired hypothyroidism -TSh has been controlled on  synthroid 25 mcg  3. Pure hypercholesterolemia -controlled on zocor  4. Moderate vascular dementia without behavioral disturbance, psychotic disturbance, mood disturbance, or anxiety (HCC) -  progressive decline, supportive care from family and now has caregivers to help with ADLs -contiue aricept and will start namenda titrate pack - memantine (NAMENDA TITRATION PAK) tablet pack; 5 mg/day for =1 week; 5 mg twice daily for =1 week; 15 mg/day given in 5 mg and 10 mg separated doses for =1 week; then 10 mg twice daily  Dispense: 49 tablet; Refill: 12  5. Weight loss -now has caregivers coming in to give her meals. Encouraged 3 meals daily with protein supplement daily. She is now eating more than she previously was.   6. Lower leg swelling -encourage increase in acitivity, increase protein in diet.  -encouraged to elevate legs above level of heart as tolerates, low sodium diet, compression hose as tolerates (on in am, off in pm)   Return in about 3 months (around 01/21/2022) for routine follow up. Marland Kitchen And weight loss.  Carlos American. [place], [facility] [phone]

## 2021-10-22 LAB — CBC WITH DIFFERENTIAL/PLATELET
Absolute Monocytes: 795 cells/uL (ref 200–950)
Basophils Absolute: 49 cells/uL (ref 0–200)
Basophils Relative: 0.5 %
Eosinophils Absolute: 68 cells/uL (ref 15–500)
Eosinophils Relative: 0.7 %
HCT: 42 % (ref 35.0–45.0)
Hemoglobin: 14.3 g/dL (ref 11.7–15.5)
Lymphs Abs: 4840 cells/uL — ABNORMAL HIGH (ref 850–3900)
MCH: 32.4 pg (ref 27.0–33.0)
MCHC: 34 g/dL (ref 32.0–36.0)
MCV: 95 fL (ref 80.0–100.0)
MPV: 10.3 fL (ref 7.5–12.5)
Monocytes Relative: 8.2 %
Neutro Abs: 3948 cells/uL (ref 1500–7800)
Neutrophils Relative %: 40.7 %
Platelets: 204 10*3/uL (ref 140–400)
RBC: 4.42 10*6/uL (ref 3.80–5.10)
RDW: 12.9 % (ref 11.0–15.0)
Total Lymphocyte: 49.9 %
WBC: 9.7 10*3/uL (ref 3.8–10.8)

## 2021-10-22 LAB — COMPLETE METABOLIC PANEL WITH GFR
AG Ratio: 2.2 (calc) (ref 1.0–2.5)
ALT: 9 U/L (ref 6–29)
AST: 18 U/L (ref 10–35)
Albumin: 4.1 g/dL (ref 3.6–5.1)
Alkaline phosphatase (APISO): 100 U/L (ref 37–153)
BUN/Creatinine Ratio: 13 (calc) (ref 6–22)
BUN: 13 mg/dL (ref 7–25)
CO2: 23 mmol/L (ref 20–32)
Calcium: 9.5 mg/dL (ref 8.6–10.4)
Chloride: 105 mmol/L (ref 98–110)
Creat: 0.98 mg/dL — ABNORMAL HIGH (ref 0.60–0.95)
Globulin: 1.9 g/dL (calc) (ref 1.9–3.7)
Glucose, Bld: 95 mg/dL (ref 65–99)
Potassium: 4.3 mmol/L (ref 3.5–5.3)
Sodium: 139 mmol/L (ref 135–146)
Total Bilirubin: 1 mg/dL (ref 0.2–1.2)
Total Protein: 6 g/dL — ABNORMAL LOW (ref 6.1–8.1)
eGFR: 53 mL/min/{1.73_m2} — ABNORMAL LOW (ref >=60)

## 2021-11-21 ENCOUNTER — Other Ambulatory Visit: Payer: Self-pay | Admitting: Nurse Practitioner

## 2022-01-14 ENCOUNTER — Encounter: Payer: Self-pay | Admitting: Adult Health

## 2022-01-14 ENCOUNTER — Ambulatory Visit (INDEPENDENT_AMBULATORY_CARE_PROVIDER_SITE_OTHER): Payer: Medicare Other | Admitting: Adult Health

## 2022-01-14 VITALS — BP 138/88 | HR 72 | Temp 97.5°F | Ht 61.0 in | Wt 137.4 lb

## 2022-01-14 DIAGNOSIS — I10 Essential (primary) hypertension: Secondary | ICD-10-CM

## 2022-01-14 DIAGNOSIS — R600 Localized edema: Secondary | ICD-10-CM | POA: Diagnosis not present

## 2022-01-14 DIAGNOSIS — S51011A Laceration without foreign body of right elbow, initial encounter: Secondary | ICD-10-CM | POA: Diagnosis not present

## 2022-01-14 DIAGNOSIS — N1831 Chronic kidney disease, stage 3a: Secondary | ICD-10-CM | POA: Diagnosis not present

## 2022-01-14 MED ORDER — FUROSEMIDE 20 MG PO TABS: 20.0000 mg | ORAL_TABLET | Freq: Every day | ORAL | 0 refills | Status: DC

## 2022-01-14 NOTE — Patient Instructions (Signed)
Laceration Care, Adult A laceration is a cut that may go through all layers of the skin and into the tissue that is right under the skin. Some lacerations heal on their own. Others need to be closed with stitches (sutures), staples, skin adhesive strips, or skin glue. Proper care of a laceration reduces the risk for infection, helps the laceration heal better, and may prevent scarring. General tips Keep the wound clean and dry. Do not scratch or pick at the wound. Wash your hands with soap and water for at least 20 seconds before and after touching your wound or changing your bandage (dressing). If soap and water are not available, use hand sanitizer. Do not usedisinfectants or antiseptics, such as rubbing alcohol, to clean your wound unless told by your health care provider. If you were given a dressing, you should change it at least once a day, or as told by your health care provider. You should also change it if it becomes wet or dirty. How to care for your laceration If sutures or staples were used: Keep the wound completely dry for the first 24 hours, or as told by your health care provider. After that time, you may shower or bathe. Do not soak your wound in water until after the sutures or staples have been removed. Clean the wound once each day, or as told by your health care provider. To do this: Wash the wound with soap and water. Rinse the wound with water to remove all soap. Pat the wound dry with a clean towel. Do not rub the wound. After cleaning the wound, apply a thin layer of antibiotic ointment, other topical ointments, or a non-adherent dressing as told by your health care provider. This will help prevent infection and keep the dressing from sticking to the wound. Have the sutures or staples removed as told by your health care provider. Do not  remove sutures or staples yourself. If skin adhesive strips were used: Do not get the skin adhesive strips wet. You may shower or bathe,  but keep the wound dry. If the wound gets wet, pat it dry with a clean towel. Do not rub the wound. Skin adhesive strips fall off on their own. If adhesive strip edges start to loosen and curl up, you may trim the loose edges. Do not remove adhesive strips completely unless your health care provider tells you to do that. If skin glue was used: You may shower or bathe, but try to keep the wound dry. Do not soak the wound in water. After showering or bathing, pat the wound dry with a clean towel. Do not rub the wound. Do not do any activities that will make you sweat a lot until the skin glue has fallen off. Do not apply liquid, cream, or ointment medicine to the wound while the skin glue is in place. Doing this may loosen the film before the wound has healed. If a dressing is placed over the wound, do not apply tape directly over the skin glue. Doing this may cause the glue to be pulled off before the wound has healed. Do not pick at the glue. Skin glue usually remains in place for 5-10 days and then falls off the skin. Follow these instructions at home: Medicines Take over-the-counter and prescription medicines only as told by your health care provider. If you were prescribed an antibiotic medicine or ointment, take or apply it as told by your health care provider. Do not stop using it even if   your condition improves. Managing pain and swelling If directed, put ice on the injured area. To do this: Put ice in a plastic bag. Place a towel between your skin and the bag. Leave the ice on for 20 minutes, 2-3 times a day. Remove the ice if your skin turns bright red. This is very important. If you cannot feel pain, heat, or cold, you have a greater risk of damage to the area. Raise (elevate) the injured area above the level of your heart while you are sitting or lying down for the first 24-48 hours after the laceration is repaired. General instructions  Avoid any activity that could cause your wound  to reopen. Check your wound every day for signs of infection. Watch for: More redness, swelling, or pain. Fluid or blood. Warmth. Pus or a bad smell. Keep all follow-up visits. This is important. Contact a health care provider if: You received a tetanus shot and you have swelling, severe pain, redness, or bleeding at the injection site. Your closed wound breaks open. You have any of these signs of infection: More redness, swelling, or pain around your wound. Fluid or blood coming from your wound. Warmth coming from your wound. Pus or a bad smell coming from your wound. A fever. You notice something coming out of the wound, such as wood or glass. Your pain is not controlled with medicine. You notice a change in the color of your skin near your wound. You need to change the dressing often. You develop a new rash. You have numbness around the wound. Get help right away if: You develop severe swelling around the wound. Your pain suddenly increases and is severe. You develop painful lumps near the wound or on skin anywhere else on your body. You have a red streak going away from your wound. The wound is on your hand or foot, and you cannot properly move a finger or toe. The wound is on your hand or foot, and you notice that your fingers or toes look pale or bluish. Summary A laceration is a cut that may go through all layers of the skin and into the tissue that is right under the skin. Some lacerations heal on their own. Others need to be closed with stitches (sutures), staples, skin adhesive strips, or skin glue. Proper care of a laceration reduces the risk of infection, helps the laceration heal better, and may prevent scarring. This information is not intended to replace advice given to you by your health care provider. Make sure you discuss any questions you have with your health care provider. Document Revised: 05/17/2020 Document Reviewed: 05/17/2020 Elsevier Patient Education   2023 [employer] Inc.  

## 2022-01-14 NOTE — Progress Notes (Signed)
[facility] clinic  Provider: Durenda Age DNP  Code Status:  Full Code  Goals of Care:     10/21/2021   10:09 AM  Advanced Directives  Does Patient Have a Medical Advance Directive? Yes  Type of Paramedic of [place];Living will  Does patient want to make changes to medical advance directive? No - Patient declined  Copy of [place] in Chart? Yes - validated most recent copy scanned in chart (See row information)     Chief Complaint  Patient presents with   Acute Visit    Patient presents today for a post fall and skin tear that occurred on yesterday,01/13/22. She have bruises to the eyes, arms and left fingers.    HPI: Patient is a 88 y.o. female seen today for an acute visit for laceration on right forearm/elbow. She was accompanied by her daughter today.  Patient lives alone and has a caregiver at home for 4 hours/day, 4 days a week. Daughter lives close to patient. Patient stated that she fell at home. When daughter came to the house, she was dressing up. She has bruises on bilateral eyes and arms. She has a skin tear on her right upper arm/elbow. Daughter don't know where she hit her arm on. No noted erythema nor drainage.  Noted BLE to have 2-3+edema. Daughter said that she has always have the edema. Today BP 138/88. She takes Amlodipine and Olmesartan for hypertension. Last GFR 53, creatinine 0.98 and BUN 13 (10/21/21).  Past Medical History:  Diagnosis Date   Arthritis    Glaucoma    Hyperlipidemia    Hypertension    Osteoporosis    Thyroid disease     Past Surgical History:  Procedure Laterality Date   ABDOMINAL HYSTERECTOMY  1965   partial   CHOLECYSTECTOMY  1989   SKIN SURGERY  02/2014   Basel Cell removed from right side of face     No Known Allergies  Outpatient Encounter Medications as of 01/14/2022  Medication Sig   amLODipine (NORVASC) 5 MG tablet TAKE 1 TABLET BY MOUTH  DAILY   Calcium Carbonate-Vit  D-Min (CALCIUM 1200 PO) Take 1 tablet by mouth daily.   Cholecalciferol (VITAMIN D) 2000 UNITS CAPS Take 1 capsule (2,000 Units total) by mouth daily.   donepezil (ARICEPT) 10 MG tablet TAKE 1 TABLET BY MOUTH AT  BEDTIME   furosemide (LASIX) 20 MG tablet Take 1 tablet (20 mg total) by mouth daily for 5 days.   levothyroxine (SYNTHROID) 25 MCG tablet TAKE 1 TABLET BY MOUTH ONCE DAILY BEFORE BREAKFAST ON  AN EMPTY STOMACH FOR  THYROID   Multiple Vitamins-Minerals (SENIOR MULTIVITAMIN PLUS PO) Take by mouth every morning.   olmesartan (BENICAR) 20 MG tablet TAKE 1 TABLET BY MOUTH  DAILY   Omega-3 Fatty Acids (FISH OIL) 1000 MG CAPS Take 1,000 capsules by mouth daily.   Polyethyl Glycol-Propyl Glycol (SYSTANE OP) Apply 1 drop to eye as needed.   simvastatin (ZOCOR) 20 MG tablet TAKE 1 TABLET BY MOUTH  DAILY FOR CHOLESTEROL   memantine (NAMENDA TITRATION PAK) tablet pack 5 mg/day for =1 week; 5 mg twice daily for =1 week; 15 mg/day given in 5 mg and 10 mg separated doses for =1 week; then 10 mg twice daily (Patient not taking: Reported on 01/14/2022)   No facility-administered encounter medications on file as of 01/14/2022.    Review of Systems:  Review of Systems  Constitutional:  Negative for appetite change, chills, fatigue  and fever.  HENT:  Negative for congestion, hearing loss, rhinorrhea and sore throat.   Eyes: Negative.   Respiratory:  Negative for cough, shortness of breath and wheezing.   Cardiovascular:  Positive for leg swelling. Negative for chest pain and palpitations.  Gastrointestinal:  Negative for abdominal pain, constipation, diarrhea, nausea and vomiting.  Genitourinary:  Negative for dysuria.  Musculoskeletal:  Negative for arthralgias, back pain and myalgias.  Skin:  Positive for wound. Negative for color change and rash.  Neurological:  Negative for dizziness, weakness and headaches.  Psychiatric/Behavioral:  Negative for behavioral problems. The patient is not  nervous/anxious.     Health Maintenance  Topic Date Due   Zoster Vaccines- Shingrix (1 of 2) Never done   Pneumonia Vaccine 61+ Years old (2 - PPSV23 or PCV20) 06/19/2014   COVID-19 Vaccine (4 - Pfizer risk series) 02/19/2021   INFLUENZA VACCINE  10/22/2021   Medicare Annual Wellness (AWV)  03/14/2022   TETANUS/TDAP  03/24/2022   DEXA SCAN  Completed   HPV VACCINES  Aged Out    Physical Exam: Vitals:   01/14/22 1459  BP: 138/88  Pulse: 72  Temp: (!) 97.5 F (36.4 C)  SpO2: 97%  Weight: 137 lb 6.4 oz (62.3 kg)  Height: '5\' 1"'  (1.549 m)   Body mass index is 25.96 kg/m. Physical Exam Constitutional:      General: She is not in acute distress.    Appearance: Normal appearance.  HENT:     Head: Normocephalic and atraumatic.     Nose: Nose normal.     Mouth/Throat:     Mouth: Mucous membranes are moist.  Eyes:     Conjunctiva/sclera: Conjunctivae normal.  Cardiovascular:     Rate and Rhythm: Normal rate and regular rhythm.  Pulmonary:     Effort: Pulmonary effort is normal.     Breath sounds: Normal breath sounds.  Abdominal:     General: Bowel sounds are normal.     Palpations: Abdomen is soft.  Musculoskeletal:        General: Normal range of motion.     Cervical back: Normal range of motion.  Skin:    General: Skin is warm and dry.     Comments: Right forearm/elbow with open wound measuring 5 cm X 3 cm. No erythema nor drainage.  Neurological:     Mental Status: She is alert. Mental status is at baseline. She is disoriented.  Psychiatric:        Mood and Affect: Mood normal.        Behavior: Behavior normal.      Labs reviewed: Basic Metabolic Panel: Recent Labs    02/25/21 1102 06/10/21 1440 10/21/21 1042  NA 142 140 139  K 3.9 4.1 4.3  CL 107 105 105  CO2 '26 23 23  ' GLUCOSE 111* 111 95  BUN '18 23 13  ' CREATININE 1.09* 1.24* 0.98*  CALCIUM 9.2 9.7 9.5  TSH 3.56  --   --    Liver Function Tests: Recent Labs    02/25/21 1102 06/10/21 1440  10/21/21 1042  AST '18 19 18  ' ALT '8 9 9  ' BILITOT 0.7 1.0 1.0  PROT 6.0* 6.1 6.0*   No results for input(s): "LIPASE", "AMYLASE" in the last 8760 hours. No results for input(s): "AMMONIA" in the last 8760 hours. CBC: Recent Labs    06/10/21 1440 10/21/21 1042  WBC 13.2* 9.7  NEUTROABS 9,636* 3,948  HGB 13.9 14.3  HCT 41.4 42.0  MCV 93.2  95.0  PLT 207 204   Lipid Panel: Recent Labs    06/10/21 1440  CHOL 142  HDL 66  LDLCALC 62  TRIG 65  CHOLHDL 2.2   Lab Results  Component Value Date   HGBA1C 5.5 11/02/2020    Procedures since last visit: No results found.  Assessment/Plan  1. Skin tear of right elbow without complication, initial encounter -   sustained from a fall at home -  cleansed wound with NS, applied mepitel with bacitracin ointment and covered with dry dressing -  instructed daughter to keep the dressing and change it only when wet -  follow up on 01/20/22  2. Bilateral lower extremity edema -  instructed daughter to give patient banana everyday -  instructed to elevate BLE at night - furosemide (LASIX) 20 MG tablet; Take 1 tablet (20 mg total) by mouth daily for 5 days.  Dispense: 5 tablet; Refill: 0  3. Essential hypertension, benign -  BP 138/88, stable -  continue Olmesartan and Amlodipine   4. Stage 3a chronic kidney disease [facility]) Lab Results  Component Value Date   NA 139 10/21/2021   K 4.3 10/21/2021   CO2 23 10/21/2021   GLUCOSE 95 10/21/2021   BUN 13 10/21/2021   CREATININE 0.98 (H) 10/21/2021   CALCIUM 9.5 10/21/2021   EGFR 53 (L) 10/21/2021   GFRNONAA 43 (L) 04/30/2020   - BMP with eGFR([facility]); Future, 01/20/22   Labs/tests ordered:  BMP on 01/20/22  Next appt:  01/20/22

## 2022-01-20 ENCOUNTER — Encounter: Payer: Self-pay | Admitting: Adult Health

## 2022-01-20 ENCOUNTER — Other Ambulatory Visit: Payer: Self-pay | Admitting: *Deleted

## 2022-01-20 ENCOUNTER — Ambulatory Visit (INDEPENDENT_AMBULATORY_CARE_PROVIDER_SITE_OTHER): Payer: Medicare Other | Admitting: Adult Health

## 2022-01-20 VITALS — BP 123/55 | HR 73 | Temp 97.7°F | Ht 61.0 in | Wt 135.0 lb

## 2022-01-20 DIAGNOSIS — T148XXA Other injury of unspecified body region, initial encounter: Secondary | ICD-10-CM

## 2022-01-20 DIAGNOSIS — R600 Localized edema: Secondary | ICD-10-CM

## 2022-01-20 DIAGNOSIS — N1831 Chronic kidney disease, stage 3a: Secondary | ICD-10-CM

## 2022-01-20 NOTE — Patient Instructions (Signed)

## 2022-01-20 NOTE — Progress Notes (Signed)
[facility] clinic  Provider:  Durenda Age DNP  Code Status:  Full Code  Goals of Care:     10/21/2021   10:09 AM  Advanced Directives  Does Patient Have a Medical Advance Directive? Yes  Type of Paramedic of [place];Living will  Does patient want to make changes to medical advance directive? No - Patient declined  Copy of [place] in Chart? Yes - validated most recent copy scanned in chart (See row information)     Chief Complaint  Patient presents with   Medical Management of Chronic Issues    1 week follow up  Discuss need for Shingles first of 2, Pneumonia 65+,Covid,and Flu vaccines, Medicare Wellness (AWV) (Yearly) Due Nov. 22, 2023.    HPI: Patient is a 88 y.o. female seen today for an acute visit for wound check. She fell at home a week ago and sustained bruising on face and laceration on right forearm. She lives in her own house with a caregiver for 4 hours/day. Daughter lives nearby. Daughter accompanied patient today. It was reported that patient has removed the dressing herself and daughter re-wrapped it. Wound tissue is reddish and pinkish. No noted pus or erythema  or swelling. Latest weight is 135 lbs, had 2 lbs weight loss in a week. She completed 5 days of Lasix 20 mg PO for lower extremity 2+edema. She remains to have 2+ .   Past Medical History:  Diagnosis Date   Arthritis    Glaucoma    Hyperlipidemia    Hypertension    Osteoporosis    Thyroid disease     Past Surgical History:  Procedure Laterality Date   ABDOMINAL HYSTERECTOMY  1965   partial   CHOLECYSTECTOMY  1989   SKIN SURGERY  02/2014   Basel Cell removed from right side of face     No Known Allergies  Outpatient Encounter Medications as of 01/20/2022  Medication Sig   amLODipine (NORVASC) 5 MG tablet TAKE 1 TABLET BY MOUTH  DAILY   Calcium Carbonate-Vit D-Min (CALCIUM 1200 PO) Take 1 tablet by mouth daily.   Cholecalciferol (VITAMIN D)  2000 UNITS CAPS Take 1 capsule (2,000 Units total) by mouth daily.   donepezil (ARICEPT) 10 MG tablet TAKE 1 TABLET BY MOUTH AT  BEDTIME   levothyroxine (SYNTHROID) 25 MCG tablet TAKE 1 TABLET BY MOUTH ONCE DAILY BEFORE BREAKFAST ON  AN EMPTY STOMACH FOR  THYROID   Multiple Vitamins-Minerals (SENIOR MULTIVITAMIN PLUS PO) Take by mouth every morning.   olmesartan (BENICAR) 20 MG tablet TAKE 1 TABLET BY MOUTH  DAILY   Omega-3 Fatty Acids (FISH OIL) 1000 MG CAPS Take 1,000 capsules by mouth daily.   Polyethyl Glycol-Propyl Glycol (SYSTANE OP) Apply 1 drop to eye as needed.   simvastatin (ZOCOR) 20 MG tablet TAKE 1 TABLET BY MOUTH  DAILY FOR CHOLESTEROL   furosemide (LASIX) 20 MG tablet Take 1 tablet (20 mg total) by mouth daily for 5 days.   [DISCONTINUED] memantine (NAMENDA TITRATION PAK) tablet pack 5 mg/day for =1 week; 5 mg twice daily for =1 week; 15 mg/day given in 5 mg and 10 mg separated doses for =1 week; then 10 mg twice daily (Patient not taking: Reported on 01/14/2022)   No facility-administered encounter medications on file as of 01/20/2022.    Review of Systems:  Review of Systems  Health Maintenance  Topic Date Due   Zoster Vaccines- Shingrix (1 of 2) Never done   Pneumonia Vaccine  28+ Years old (2 - PPSV23 or PCV20) 06/19/2014   COVID-19 Vaccine (4 - Pfizer risk series) 02/19/2021   INFLUENZA VACCINE  10/22/2021   Medicare Annual Wellness (AWV)  02/12/2022   TETANUS/TDAP  03/24/2022   DEXA SCAN  Completed   HPV VACCINES  Aged Out    Physical Exam: Vitals:   01/20/22 1417  BP: (!) 123/55  Pulse: 73  Temp: 97.7 F (36.5 C)  SpO2: 96%  Weight: 135 lb (61.2 kg)  Height: '5\' 1"'$  (1.549 m)   Body mass index is 25.51 kg/m. Physical Exam Constitutional:      Appearance: Normal appearance.  HENT:     Head: Normocephalic and atraumatic.     Nose: Nose normal.     Mouth/Throat:     Mouth: Mucous membranes are moist.  Eyes:     Conjunctiva/sclera: Conjunctivae  normal.  Cardiovascular:     Rate and Rhythm: Normal rate and regular rhythm.  Pulmonary:     Effort: Pulmonary effort is normal.     Breath sounds: Normal breath sounds.  Abdominal:     General: Bowel sounds are normal.     Palpations: Abdomen is soft.  Musculoskeletal:        General: Normal range of motion.     Cervical back: Normal range of motion.  Skin:    General: Skin is warm and dry.     Comments: Wound on right forearm near the elbow with pinkish-reddish tissue, has minimal serosanguinous drainage.  Neurological:     General: No focal deficit present.     Mental Status: She is alert. She is disoriented.  Psychiatric:        Mood and Affect: Mood normal.        Behavior: Behavior normal.     Labs reviewed: Basic Metabolic Panel: Recent Labs    02/25/21 1102 06/10/21 1440 10/21/21 1042  NA 142 140 139  K 3.9 4.1 4.3  CL 107 105 105  CO2 '26 23 23  '$ GLUCOSE 111* 111 95  BUN '18 23 13  '$ CREATININE 1.09* 1.24* 0.98*  CALCIUM 9.2 9.7 9.5  TSH 3.56  --   --    Liver Function Tests: Recent Labs    02/25/21 1102 06/10/21 1440 10/21/21 1042  AST '18 19 18  '$ ALT '8 9 9  '$ BILITOT 0.7 1.0 1.0  PROT 6.0* 6.1 6.0*   No results for input(s): "LIPASE", "AMYLASE" in the last 8760 hours. No results for input(s): "AMMONIA" in the last 8760 hours. CBC: Recent Labs    06/10/21 1440 10/21/21 1042  WBC 13.2* 9.7  NEUTROABS 9,636* 3,948  HGB 13.9 14.3  HCT 41.4 42.0  MCV 93.2 95.0  PLT 207 204   Lipid Panel: Recent Labs    06/10/21 1440  CHOL 142  HDL 66  LDLCALC 62  TRIG 65  CHOLHDL 2.2   Lab Results  Component Value Date   HGBA1C 5.5 11/02/2020    Procedures since last visit: No results found.  Assessment/Plan  1. Open wound -  Cleansed wound on right forearm with NS, applied, applied mepitel dressing with triple antibiotic ointment and covered with dressing. -   instructed daughter to keep wound clean and dry  2. Bilateral lower extremity edema -   completed Lasix X 5 days -  instructed to elevate bilateral feet elevated at night and when sitting for a long time -  BMP will be done today at the lab      Labs/tests ordered:  BMP  Next appt:  02/07/2022

## 2022-01-21 LAB — BASIC METABOLIC PANEL WITH GFR
BUN: 17 mg/dL (ref 7–25)
CO2: 28 mmol/L (ref 20–32)
Calcium: 9.6 mg/dL (ref 8.6–10.4)
Chloride: 106 mmol/L (ref 98–110)
Creat: 0.89 mg/dL (ref 0.60–0.95)
Glucose, Bld: 114 mg/dL — ABNORMAL HIGH (ref 65–99)
Potassium: 3.8 mmol/L (ref 3.5–5.3)
Sodium: 142 mmol/L (ref 135–146)
eGFR: 60 mL/min/{1.73_m2} (ref >=60)

## 2022-01-23 NOTE — Progress Notes (Signed)
BMP within normal. No new order.

## 2022-01-24 ENCOUNTER — Ambulatory Visit: Payer: Medicare Other | Admitting: Nurse Practitioner

## 2022-02-03 ENCOUNTER — Ambulatory Visit (INDEPENDENT_AMBULATORY_CARE_PROVIDER_SITE_OTHER): Payer: Medicare Other | Admitting: Adult Health

## 2022-02-03 ENCOUNTER — Encounter: Payer: Self-pay | Admitting: Adult Health

## 2022-02-03 VITALS — BP 120/58 | HR 75 | Temp 97.1°F | Ht 61.0 in | Wt 135.0 lb

## 2022-02-03 DIAGNOSIS — T148XXA Other injury of unspecified body region, initial encounter: Secondary | ICD-10-CM | POA: Diagnosis not present

## 2022-02-03 DIAGNOSIS — F01B0 Vascular dementia, moderate, without behavioral disturbance, psychotic disturbance, mood disturbance, and anxiety: Secondary | ICD-10-CM

## 2022-02-03 DIAGNOSIS — E7800 Pure hypercholesterolemia, unspecified: Secondary | ICD-10-CM

## 2022-02-03 DIAGNOSIS — E039 Hypothyroidism, unspecified: Secondary | ICD-10-CM

## 2022-02-03 DIAGNOSIS — I10 Essential (primary) hypertension: Secondary | ICD-10-CM

## 2022-02-03 DIAGNOSIS — Z23 Encounter for immunization: Secondary | ICD-10-CM | POA: Diagnosis not present

## 2022-02-03 NOTE — Progress Notes (Signed)
[facility] clinic  Provider:  Durenda Age DNP  Code Status:  Full Code  Goals of Care:     10/21/2021   10:09 AM  Advanced Directives  Does Patient Have a Medical Advance Directive? Yes  Type of Paramedic of [place];Living will  Does patient want to make changes to medical advance directive? No - Patient declined  Copy of [place] in Chart? Yes - validated most recent copy scanned in chart (See row information)     Chief Complaint  Patient presents with   Medical Management of Chronic Issues    2 week follow up  Discuss need for Shingles, Pneumonia, Covid and Flu vaccine.    HPI: Patient is a 88 y.o. female seen today for medical management of chronic diseases. She has a PMH of arthritis, glaucoma, hypertension, osteoporosis and hypothyroidism.  Acquired hypothyroidism -  takes Levothyroxine  Essential hypertension, benign  - BP 120/58, takes Amlodipine and Olmesartan  Open wound -  right forearm near the elbow wound is improving, pinkish, no drainage  Pure hypercholesterolemia -  takes Simvastatin     Moderate vascular dementia without behavioral disturbance, psychotic disturbance, mood disturbance, or anxiety (HCC)  -  takes Donepezil   Past Medical History:  Diagnosis Date   Arthritis    Glaucoma    Hyperlipidemia    Hypertension    Osteoporosis    Thyroid disease     Past Surgical History:  Procedure Laterality Date   ABDOMINAL HYSTERECTOMY  1965   partial   [place]   SKIN SURGERY  02/2014   Basel Cell removed from right side of face     No Known Allergies  Outpatient Encounter Medications as of 02/03/2022  Medication Sig   amLODipine (NORVASC) 5 MG tablet TAKE 1 TABLET BY MOUTH  DAILY   Calcium Carbonate-Vit D-Min (CALCIUM 1200 PO) Take 1 tablet by mouth daily.   Cholecalciferol (VITAMIN D) 2000 UNITS CAPS Take 1 capsule (2,000 Units total) by mouth daily.   donepezil (ARICEPT) 10 MG  tablet TAKE 1 TABLET BY MOUTH AT  BEDTIME   levothyroxine (SYNTHROID) 25 MCG tablet TAKE 1 TABLET BY MOUTH ONCE DAILY BEFORE BREAKFAST ON  AN EMPTY STOMACH FOR  THYROID   Multiple Vitamins-Minerals (SENIOR MULTIVITAMIN PLUS PO) Take by mouth every morning.   olmesartan (BENICAR) 20 MG tablet TAKE 1 TABLET BY MOUTH  DAILY   Omega-3 Fatty Acids (FISH OIL) 1000 MG CAPS Take 1,000 capsules by mouth daily.   Polyethyl Glycol-Propyl Glycol (SYSTANE OP) Apply 1 drop to eye as needed.   simvastatin (ZOCOR) 20 MG tablet TAKE 1 TABLET BY MOUTH  DAILY FOR CHOLESTEROL   [DISCONTINUED] furosemide (LASIX) 20 MG tablet Take 1 tablet (20 mg total) by mouth daily for 5 days.   No facility-administered encounter medications on file as of 02/03/2022.    Review of Systems:  Review of Systems  Constitutional:  Negative for appetite change, chills, fatigue and fever.  HENT:  Negative for congestion, hearing loss, rhinorrhea and sore throat.   Eyes: Negative.   Respiratory:  Negative for cough, shortness of breath and wheezing.   Cardiovascular:  Positive for leg swelling. Negative for chest pain and palpitations.  Gastrointestinal:  Negative for abdominal pain, constipation, diarrhea, nausea and vomiting.  Genitourinary:  Negative for dysuria.  Musculoskeletal:  Negative for arthralgias, back pain and myalgias.  Skin:  Negative for color change, rash and wound.  Neurological:  Negative for dizziness, weakness  and headaches.  Psychiatric/Behavioral:  Negative for behavioral problems. The patient is not nervous/anxious.     Health Maintenance  Topic Date Due   Zoster Vaccines- Shingrix (1 of 2) Never done   Pneumonia Vaccine 64+ Years old (2 - PPSV23 or PCV20) 06/19/2014   COVID-19 Vaccine (4 - Pfizer risk series) 02/19/2021   INFLUENZA VACCINE  10/22/2021   Medicare Annual Wellness (AWV)  02/12/2022   TETANUS/TDAP  03/24/2022   DEXA SCAN  Completed   HPV VACCINES  Aged Out    Physical Exam: Vitals:    02/03/22 0823  BP: (!) 120/58  Pulse: 75  Temp: (!) 97.1 F (36.2 C)  SpO2: 97%  Weight: 135 lb (61.2 kg)  Height: '5\' 1"'$  (1.549 m)   Body mass index is 25.51 kg/m. Physical Exam Constitutional:      General: She is not in acute distress.    Appearance: Normal appearance.  HENT:     Head: Normocephalic and atraumatic.     Nose: Nose normal.     Mouth/Throat:     Mouth: Mucous membranes are moist.  Eyes:     Conjunctiva/sclera: Conjunctivae normal.  Cardiovascular:     Rate and Rhythm: Normal rate and regular rhythm.  Pulmonary:     Effort: Pulmonary effort is normal.     Breath sounds: Normal breath sounds.  Abdominal:     General: Bowel sounds are normal.     Palpations: Abdomen is soft.  Musculoskeletal:        General: Normal range of motion.     Cervical back: Normal range of motion.     Right lower leg: Edema present.     Left lower leg: Edema present.     Comments: BLE 2+edema  Skin:    General: Skin is warm and dry.     Comments: Open wound on right forearm near the elbow.  Neurological:     Mental Status: She is alert. Mental status is at baseline.  Psychiatric:        Mood and Affect: Mood normal.        Behavior: Behavior normal.     Labs reviewed: Basic Metabolic Panel: Recent Labs    02/25/21 1102 06/10/21 1440 10/21/21 1042 01/20/22 1500  NA 142 140 139 142  K 3.9 4.1 4.3 3.8  CL 107 105 105 106  CO2 '26 23 23 28  '$ GLUCOSE 111* 111 95 114*  BUN '18 23 13 17  '$ CREATININE 1.09* 1.24* 0.98* 0.89  CALCIUM 9.2 9.7 9.5 9.6  TSH 3.56  --   --   --    Liver Function Tests: Recent Labs    02/25/21 1102 06/10/21 1440 10/21/21 1042  AST '18 19 18  '$ ALT '8 9 9  '$ BILITOT 0.7 1.0 1.0  PROT 6.0* 6.1 6.0*   No results for input(s): "LIPASE", "AMYLASE" in the last 8760 hours. No results for input(s): "AMMONIA" in the last 8760 hours. CBC: Recent Labs    06/10/21 1440 10/21/21 1042  WBC 13.2* 9.7  NEUTROABS 9,636* 3,948  HGB 13.9 14.3  HCT  41.4 42.0  MCV 93.2 95.0  PLT 207 204   Lipid Panel: Recent Labs    06/10/21 1440  CHOL 142  HDL 66  LDLCALC 62  TRIG 65  CHOLHDL 2.2   Lab Results  Component Value Date   HGBA1C 5.5 11/02/2020    Procedures since last visit: No results found.  Assessment/Plan  1. Need for influenza vaccination - Flu Vaccine  QUAD High Dose(Fluad)  2. Acquired hypothyroidism Lab Results  Component Value Date   TSH 3.56 02/25/2021   -  continue Levothyroxine  3. Essential hypertension, benign -  BP 120/58, stable -  continue Amlodipine and Olmesartan  4. Open wound -  improving, no erythema, tissue is pinkish -  Cleansed with NS, applied Mepitel with triple antibiotic, covered with Mepilex and coban wrap. -  Daughter will do treatments at home and monitor for infection  5. Pure hypercholesterolemia Lab Results  Component Value Date   CHOL 142 06/10/2021   HDL 66 06/10/2021   LDLCALC 62 06/10/2021   TRIG 65 06/10/2021   CHOLHDL 2.2 06/10/2021   -  continue Simvastatin  6. Moderate vascular dementia without behavioral disturbance, psychotic disturbance, mood disturbance, or anxiety ([place]) -  continue Aricept -  fall precautions     Labs/tests ordered:  None  Next appt:  02/07/2022

## 2022-02-03 NOTE — Patient Instructions (Addendum)

## 2022-02-07 ENCOUNTER — Ambulatory Visit: Payer: Medicare Other | Admitting: Nurse Practitioner

## 2022-02-20 ENCOUNTER — Encounter: Payer: Medicare Other | Admitting: Nurse Practitioner

## 2022-03-03 ENCOUNTER — Other Ambulatory Visit (HOSPITAL_BASED_OUTPATIENT_CLINIC_OR_DEPARTMENT_OTHER): Payer: Self-pay

## 2022-03-03 ENCOUNTER — Encounter: Payer: Self-pay | Admitting: Adult Health

## 2022-03-03 ENCOUNTER — Ambulatory Visit (INDEPENDENT_AMBULATORY_CARE_PROVIDER_SITE_OTHER): Payer: Medicare Other | Admitting: Adult Health

## 2022-03-03 VITALS — BP 116/68 | HR 76 | Temp 98.0°F | Resp 18 | Ht 61.0 in | Wt 136.0 lb

## 2022-03-03 DIAGNOSIS — R600 Localized edema: Secondary | ICD-10-CM

## 2022-03-03 DIAGNOSIS — S51011D Laceration without foreign body of right elbow, subsequent encounter: Secondary | ICD-10-CM | POA: Diagnosis not present

## 2022-03-03 DIAGNOSIS — T148XXA Other injury of unspecified body region, initial encounter: Secondary | ICD-10-CM

## 2022-03-03 DIAGNOSIS — Z23 Encounter for immunization: Secondary | ICD-10-CM | POA: Diagnosis not present

## 2022-03-03 DIAGNOSIS — I10 Essential (primary) hypertension: Secondary | ICD-10-CM | POA: Diagnosis not present

## 2022-03-03 MED ORDER — AREXVY 120 MCG/0.5ML IM SUSR
INTRAMUSCULAR | 0 refills | Status: DC
  Filled 2022-03-03: qty 0.5, 1d supply, fill #0

## 2022-03-03 MED ORDER — COMIRNATY 30 MCG/0.3ML IM SUSY
PREFILLED_SYRINGE | INTRAMUSCULAR | 0 refills | Status: DC
  Filled 2022-03-03: qty 0.3, 1d supply, fill #0

## 2022-03-03 NOTE — Progress Notes (Addendum)
[facility] clinic  Provider:  Durenda Age DNP  Code Status:  Full Code  Goals of Care:     10/21/2021   10:09 AM  Advanced Directives  Does Patient Have a Medical Advance Directive? Yes  Type of Paramedic of [place];Living will  Does patient want to make changes to medical advance directive? No - Patient declined  Copy of [place] in Chart? Yes - validated most recent copy scanned in chart (See row information)     Chief Complaint  Patient presents with   Acute Visit    Follow up for arm wound from fall    HPI: Patient is a 88 y.o. female seen today for an acute visit for wound follow up. She was accompanied today by her daughter. Right elbow wound sustained from a fall at home on 01/14/22 noted to be healed now. She was noted to have RLE  edema 3+ and LLE 2+edema. Bilateral lower extremities non-tender. No open wounds noted.  BP 116/68, takes Amlodipine and Olmesartan for hypertension.  Past Medical History:  Diagnosis Date   Arthritis    Glaucoma    Hyperlipidemia    Hypertension    Osteoporosis    Thyroid disease     Past Surgical History:  Procedure Laterality Date   ABDOMINAL HYSTERECTOMY  1965   partial   CHOLECYSTECTOMY  1989   SKIN SURGERY  02/2014   Basel Cell removed from right side of face     No Known Allergies  Outpatient Encounter Medications as of 03/03/2022  Medication Sig   amLODipine (NORVASC) 5 MG tablet TAKE 1 TABLET BY MOUTH  DAILY   Calcium Carbonate-Vit D-Min (CALCIUM 1200 PO) Take 1 tablet by mouth daily.   Cholecalciferol (VITAMIN D) 2000 UNITS CAPS Take 1 capsule (2,000 Units total) by mouth daily.   donepezil (ARICEPT) 10 MG tablet TAKE 1 TABLET BY MOUTH AT  BEDTIME   levothyroxine (SYNTHROID) 25 MCG tablet TAKE 1 TABLET BY MOUTH ONCE DAILY BEFORE BREAKFAST ON  AN EMPTY STOMACH FOR  THYROID   Multiple Vitamins-Minerals (SENIOR MULTIVITAMIN PLUS PO) Take by mouth every morning.    olmesartan (BENICAR) 20 MG tablet TAKE 1 TABLET BY MOUTH  DAILY   Omega-3 Fatty Acids (FISH OIL) 1000 MG CAPS Take 1,000 capsules by mouth daily.   Polyethyl Glycol-Propyl Glycol (SYSTANE OP) Apply 1 drop to eye as needed.   simvastatin (ZOCOR) 20 MG tablet TAKE 1 TABLET BY MOUTH  DAILY FOR CHOLESTEROL   No facility-administered encounter medications on file as of 03/03/2022.    Review of Systems:  Review of Systems  Constitutional:  Negative for appetite change, chills, fatigue and fever.  HENT:  Negative for congestion, hearing loss, rhinorrhea and sore throat.   Eyes: Negative.   Respiratory:  Negative for cough, shortness of breath and wheezing.   Cardiovascular:  Negative for chest pain, palpitations and leg swelling.  Gastrointestinal:  Negative for abdominal pain, constipation, diarrhea, nausea and vomiting.  Genitourinary:  Negative for dysuria.  Musculoskeletal:  Negative for arthralgias, back pain and myalgias.  Skin:  Negative for color change, rash and wound.  Neurological:  Negative for dizziness, weakness and headaches.  Psychiatric/Behavioral:  Negative for behavioral problems. The patient is not nervous/anxious.     Health Maintenance  Topic Date Due   Zoster Vaccines- Shingrix (1 of 2) Never done   Pneumonia Vaccine 78+ Years old (2 - PPSV23 or PCV20) 06/19/2014   COVID-19 Vaccine (4 - 2023-24  season) 11/22/2021   Medicare Annual Wellness (AWV)  02/12/2022   DTaP/Tdap/Td (2 - Td or Tdap) 03/24/2022   INFLUENZA VACCINE  Completed   DEXA SCAN  Completed   HPV VACCINES  Aged Out    Physical Exam: Vitals:   03/03/22 1315  BP: 116/68  Pulse: 76  Resp: 18  Temp: 98 F (36.7 C)  SpO2: 98%  Weight: 136 lb (61.7 kg)  Height: 5' 1"$  (1.549 m)   Body mass index is 25.7 kg/m. Physical Exam Constitutional:      Appearance: Normal appearance.  HENT:     Head: Normocephalic and atraumatic.     Nose: Nose normal.     Mouth/Throat:     Mouth: Mucous membranes  are moist.  Eyes:     Conjunctiva/sclera: Conjunctivae normal.  Cardiovascular:     Rate and Rhythm: Normal rate and regular rhythm.  Pulmonary:     Effort: Pulmonary effort is normal.     Breath sounds: Normal breath sounds.  Abdominal:     General: Bowel sounds are normal.     Palpations: Abdomen is soft.  Musculoskeletal:        General: Swelling present. Normal range of motion.     Cervical back: Normal range of motion.     Comments: RLE 3+edema LLE 2+edema  Skin:    General: Skin is warm and dry.  Neurological:     General: No focal deficit present.     Mental Status: She is alert.  Psychiatric:        Mood and Affect: Mood normal.        Behavior: Behavior normal.        Thought Content: Thought content normal.        Judgment: Judgment normal.     Labs reviewed: Basic Metabolic Panel: Recent Labs    06/10/21 1440 10/21/21 1042 01/20/22 1500  NA 140 139 142  K 4.1 4.3 3.8  CL 105 105 106  CO2 23 23 28  $ GLUCOSE 111 95 114*  BUN 23 13 17  $ CREATININE 1.24* 0.98* 0.89  CALCIUM 9.7 9.5 9.6   Liver Function Tests: Recent Labs    06/10/21 1440 10/21/21 1042  AST 19 18  ALT 9 9  BILITOT 1.0 1.0  PROT 6.1 6.0*   No results for input(s): "LIPASE", "AMYLASE" in the last 8760 hours. No results for input(s): "AMMONIA" in the last 8760 hours. CBC: Recent Labs    06/10/21 1440 10/21/21 1042  WBC 13.2* 9.7  NEUTROABS 9,636* 3,948  HGB 13.9 14.3  HCT 41.4 42.0  MCV 93.2 95.0  PLT 207 204   Lipid Panel: Recent Labs    06/10/21 1440  CHOL 142  HDL 66  LDLCALC 62  TRIG 65  CHOLHDL 2.2   Lab Results  Component Value Date   HGBA1C 5.5 11/02/2020    Procedures since last visit: No results found.  Assessment/Plan  1. Lower extremity edema -  daughter will take patient after being scheduled - VAS [place] LOWER EXTREMITY VENOUS (DVT); Future  2. Laceration of right elbow, sequela -  sustained from a fall at home on 01/14/22 -  healed  3.  Essential hypertension, benign -Blood pressure well controlled Continue current medications   Labs/tests ordered:  - VAS [place] LOWER EXTREMITY VENOUS (DVT); Future  Next appt:  03/11/2022

## 2022-03-03 NOTE — Patient Instructions (Signed)
Edema ? ?Edema is when you have too much fluid in your body or under your skin. Edema may make your legs, feet, and ankles swell. Swelling often happens in looser tissues, such as around your eyes. This is a common condition. It gets more common as you get older. ?There are many possible causes of edema. These include: ?Eating too much salt (sodium). ?Being on your feet or sitting for a long time. ?Certain medical conditions, such as: ?Pregnancy. ?Heart failure. ?Liver disease. ?Kidney disease. ?Cancer. ?Hot weather may make edema worse. Edema is usually painless. Your skin may look swollen or shiny. ?Follow these instructions at home: ?Medicines ?Take over-the-counter and prescription medicines only as told by your doctor. ?Your doctor may prescribe a medicine to help your body get rid of extra water (diuretic). Take this medicine if you are told to take it. ?Eating and drinking ?Eat a low-salt (low-sodium) diet as told by your doctor. Sometimes, eating less salt may reduce swelling. ?Depending on the cause of your swelling, you may need to limit how much fluid you drink (fluid restriction). ?General instructions ?Raise the injured area above the level of your heart while you are sitting or lying down. ?Do not sit still or stand for a long time. ?Do not wear tight clothes. Do not wear garters on your upper legs. ?Exercise your legs. This can help the swelling go down. ?Wear compression stockings as told by your doctor. It is important that these are the right size. These should be prescribed by your doctor to prevent possible injuries. ?If elastic bandages or wraps are recommended, use them as told by your doctor. ?Contact a doctor if: ?Treatment is not working. ?You have heart, liver, or kidney disease and have symptoms of edema. ?You have sudden and unexplained weight gain. ?Get help right away if: ?You have shortness of breath or chest pain. ?You cannot breathe when you lie down. ?You have pain, redness, or  warmth in the swollen areas. ?You have heart, liver, or kidney disease and get edema all of a sudden. ?You have a fever and your symptoms get worse all of a sudden. ?These symptoms may be an emergency. Get help right away. Call 911. ?Do not wait to see if the symptoms will go away. ?Do not drive yourself to the hospital. ?Summary ?Edema is when you have too much fluid in your body or under your skin. ?Edema may make your legs, feet, and ankles swell. Swelling often happens in looser tissues, such as around your eyes. ?Raise the injured area above the level of your heart while you are sitting or lying down. ?Follow your doctor's instructions about diet and how much fluid you can drink. ?This information is not intended to replace advice given to you by your health care provider. Make sure you discuss any questions you have with your health care provider. ?Document Revised: 11/12/2020 Document Reviewed: 11/12/2020 ?Elsevier Patient Education ? 2023 [employer] ? ?

## 2022-03-04 ENCOUNTER — Telehealth (HOSPITAL_BASED_OUTPATIENT_CLINIC_OR_DEPARTMENT_OTHER): Payer: Self-pay | Admitting: *Deleted

## 2022-03-04 NOTE — Telephone Encounter (Signed)
Opened in error

## 2022-03-06 ENCOUNTER — Ambulatory Visit (HOSPITAL_COMMUNITY)
Admission: RE | Admit: 2022-03-06 | Discharge: 2022-03-06 | Disposition: A | Discharge status: Home / Self Care | Payer: Medicare Other | Source: Ambulatory Visit | Attending: Cardiovascular Disease | Admitting: Cardiovascular Disease

## 2022-03-06 DIAGNOSIS — R600 Localized edema: Secondary | ICD-10-CM

## 2022-03-10 ENCOUNTER — Encounter: Payer: Medicare Other | Admitting: Nurse Practitioner

## 2022-03-11 ENCOUNTER — Encounter: Payer: Medicare Other | Admitting: Nurse Practitioner

## 2022-03-12 DIAGNOSIS — H401134 Primary open-angle glaucoma, bilateral, indeterminate stage: Secondary | ICD-10-CM | POA: Diagnosis not present

## 2022-03-25 ENCOUNTER — Encounter: Payer: Self-pay | Admitting: Nurse Practitioner

## 2022-03-25 NOTE — Progress Notes (Unsigned)
   This service is provided via telemedicine  No vital signs collected/recorded due to the encounter was a telemedicine visit.   Location of patient (ex: home, work):  Home  Patient consents to a telephone visit: Yes, see telephone visit dated 03/26/2022  Location of the provider (ex: office, home):  [facility], Office   Name of any referring provider:  N/A  Names of all persons participating in the telemedicine service and their role in the encounter:  S.Chrae B/CMA, [name]- daughter, Sherrie Mustache, NP, and Patient   Time spent on call:  9 min with medical assistant

## 2022-03-26 ENCOUNTER — Encounter: Payer: Self-pay | Admitting: Nurse Practitioner

## 2022-03-26 ENCOUNTER — Telehealth: Payer: Self-pay

## 2022-03-26 ENCOUNTER — Ambulatory Visit (INDEPENDENT_AMBULATORY_CARE_PROVIDER_SITE_OTHER): Payer: Medicare Other | Admitting: Nurse Practitioner

## 2022-03-26 DIAGNOSIS — Z0000 Encounter for general adult medical examination without abnormal findings: Secondary | ICD-10-CM | POA: Diagnosis not present

## 2022-03-26 NOTE — Progress Notes (Signed)
Subjective:   Krystal [name] is a 89 y.o. female who presents for Medicare Annual (Subsequent) preventive examination.  Review of Systems           Objective:    There were no vitals filed for this visit. There is no height or weight on file to calculate BMI.     03/26/2022   11:02 AM 03/25/2022    3:18 PM 10/21/2021   10:09 AM 06/10/2021    1:55 PM 02/25/2021   10:29 AM 02/12/2021    8:24 AM 11/02/2020    2:51 PM  Advanced Directives  Does Patient Have a Medical Advance Directive? Yes Yes Yes Yes Yes Yes Yes  Type of Advance Directive Living will;Healthcare Power of Attorney Living will;Healthcare Power of [place];Living will [place];Living will [place];Living will [place];Living will [place];Living will  Does patient want to make changes to medical advance directive? No - Patient declined No - Patient declined No - Patient declined No - Patient declined No - Patient declined No - Patient declined No - Patient declined  Copy of [place] in Chart? Yes - validated most recent copy scanned in chart (See row information) Yes - validated most recent copy scanned in chart (See row information) Yes - validated most recent copy scanned in chart (See row information) Yes - validated most recent copy scanned in chart (See row information) Yes - validated most recent copy scanned in chart (See row information) Yes - validated most recent copy scanned in chart (See row information) Yes - validated most recent copy scanned in chart (See row information)    Current Medications (verified) Outpatient Encounter Medications as of 03/26/2022  Medication Sig   amLODipine (NORVASC) 5 MG tablet TAKE 1 TABLET BY MOUTH  DAILY   Calcium Carbonate-Vit D-Min (CALCIUM 1200 PO) Take 1 tablet by mouth daily.   Cholecalciferol (VITAMIN D) 2000 UNITS CAPS Take 1 capsule (2,000  Units total) by mouth daily.   donepezil (ARICEPT) 10 MG tablet TAKE 1 TABLET BY MOUTH AT  BEDTIME   levothyroxine (SYNTHROID) 25 MCG tablet TAKE 1 TABLET BY MOUTH ONCE DAILY BEFORE BREAKFAST ON  AN EMPTY STOMACH FOR  THYROID   Multiple Vitamins-Minerals (SENIOR MULTIVITAMIN PLUS PO) Take by mouth every morning.   olmesartan (BENICAR) 20 MG tablet TAKE 1 TABLET BY MOUTH  DAILY   Omega-3 Fatty Acids (FISH OIL) 1000 MG CAPS Take 1,000 capsules by mouth daily.   Polyethyl Glycol-Propyl Glycol (SYSTANE OP) Apply 1 drop to eye as needed.   simvastatin (ZOCOR) 20 MG tablet TAKE 1 TABLET BY MOUTH  DAILY FOR CHOLESTEROL   [DISCONTINUED] COVID-19 mRNA vaccine 2023-2024 (COMIRNATY) syringe Inject into the muscle. (Patient not taking: Reported on 03/26/2022)   [DISCONTINUED] RSV vaccine recomb adjuvanted (AREXVY) 120 MCG/0.5ML injection Inject into the muscle. (Patient not taking: Reported on 03/26/2022)   No facility-administered encounter medications on file as of 03/26/2022.    Allergies (verified) Patient has no known allergies.   History: Past Medical History:  Diagnosis Date   Arthritis    Glaucoma    Hyperlipidemia    Hypertension    Osteoporosis    Thyroid disease    Past Surgical History:  Procedure Laterality Date   ABDOMINAL HYSTERECTOMY  1965   partial   CHOLECYSTECTOMY  1989   SKIN SURGERY  02/2014   [place] Cell removed from right side of face  Family History  Problem Relation Age of Onset   Cancer Mother    Cancer Father        lung   Heart disease Father    Social History   Socioeconomic History   Marital status: Widowed    Spouse name: Not on file   Number of children: Not on file   Years of education: Not on file   Highest education level: Not on file  Occupational History   Not on file  Tobacco Use   Smoking status: Never   Smokeless tobacco: Never   Tobacco comments:    never used tobacco  Vaping Use   Vaping Use: Never used  Substance and Sexual Activity    Alcohol use: No    Alcohol/week: 0.0 standard drinks of alcohol   Drug use: No   Sexual activity: Not Currently  Other Topics Concern   Not on file  Social History Narrative   Caffeine: Coffee   Widow, married in 1947   Lives in house, 1 stories, one person, one dog   Current/past profession: Network engineer   Exercise: No   Living Will: Yes   DNR: No, would not like to discuss              Social Determinants of Health   Financial Resource Strain: Low Risk  (02/01/2018)   Overall Financial Resource Strain (CARDIA)    Difficulty of Paying Living Expenses: Not hard at all  Food Insecurity: No Food Insecurity (02/01/2018)   Hunger Vital Sign    Worried About [place] in the Last Year: Never true    [place] in the Last Year: Never true  Transportation Needs: No Transportation Needs (02/01/2018)   PRAPARE - Hydrologist (Medical): No    Lack of Transportation (Non-Medical): No  Physical Activity: Inactive (02/01/2018)   Exercise Vital Sign    Days of Exercise per Week: 0 days    Minutes of Exercise per Session: 0 min  Stress: No Stress Concern Present (02/01/2018)   [place]    Feeling of Stress : Not at all  Social Connections: Moderately Isolated (02/01/2018)   Social Connection and Isolation Panel [NHANES]    Frequency of Communication with Friends and Family: More than three times a week    Frequency of Social Gatherings with Friends and Family: More than three times a week    Attends Religious Services: Never    Marine scientist or Organizations: No    Attends Archivist Meetings: Never    Marital Status: Widowed    Tobacco Counseling Counseling given: Not Answered Tobacco comments: never used tobacco   Clinical Intake:                 Diabetic?no         Activities of Daily Living     No data to display           Patient Care Team: Lauree Chandler, NP as PCP - General (Geriatric Medicine) [name], MD as Consulting Physician (Oncology) [name], MD as Consulting Physician (Ophthalmology)  Indicate any recent Medical Services you may have received from other than [facility] providers in the past year (date may be approximate).     Assessment:   This is a routine wellness examination for Krystal [name].  Hearing/Vision screen Hearing Screening - Comments:: No change in hearing, does not wear hearing  aids Vision Screening - Comments:: Last eye exam less than 12 months ago with Dr.[facility]   Dietary issues and exercise activities discussed:     Goals Addressed   None    Depression Screen    03/26/2022   10:59 AM 06/10/2021    2:02 PM 02/25/2021   10:34 AM 02/12/2021    8:55 AM 04/30/2020   12:06 PM 02/08/2020    1:00 PM 10/13/2019   10:05 AM  PHQ 2/9 Scores  PHQ - 2 Score 0 0 0 0 0 0 0    Fall Risk    03/26/2022   10:59 AM 01/14/2022    2:55 PM 10/21/2021   10:08 AM 06/10/2021    2:01 PM 02/25/2021   10:34 AM  Fall Risk   Falls in the past year? '1 1 1 1 '$ 0  Number falls in past yr: 0 1 0 1 0  Injury with Fall? 0 1 0 0 0  Risk for fall due to : No Fall Risks History of fall(s) No Fall Risks History of fall(s) No Fall Risks  Follow up Falls evaluation completed Falls evaluation completed Falls evaluation completed Falls evaluation completed Falls evaluation completed    [place]:  Any stairs in or around the home? No  If so, are there any without handrails?  na Home free of loose throw rugs in walkways, pet beds, electrical cords, etc? Yes  Adequate lighting in your home to reduce risk of falls? Yes   ASSISTIVE DEVICES UTILIZED TO PREVENT FALLS:  Life alert? No  Use of a cane, walker or w/c? Yes  Grab bars in the bathroom? Yes  Shower chair or bench in shower? Yes  Elevated toilet seat or a handicapped toilet? No   TIMED UP AND  GO:  Was the test performed? No .   Cognitive Function:    04/05/2018   12:39 PM 02/01/2018   11:09 AM 09/26/2016    3:30 PM 09/10/2015    2:00 PM 07/31/2014    9:14 AM  MMSE - Mini Mental State Exam  Orientation to time '5 5 5 5 4  '$ Orientation to Place '4 4 4 4 4  '$ Registration '3 3 3 3 3  '$ Attention/ Calculation '5 5 5 5 5  '$ Recall '3 3 2 3 1  '$ Language- name 2 objects '2 2 2 2 2  '$ Language- repeat 0 '1 1 1 1  '$ Language- follow 3 step command '3 3 3 3 3  '$ Language- read & follow direction '1 1 1 1 1  '$ Write a sentence '1 1 1 1 1  '$ Copy design '1 1 1 1 '$ 0  Total score '28 29 28 29 25        '$ 03/26/2022   11:02 AM 02/12/2021    8:57 AM 02/08/2020    1:00 PM  6CIT Screen  What Year? 4 points 0 points 0 points  What month? 0 points 0 points 0 points  What time? 3 points 0 points 0 points  Count back from 20 0 points 0 points 0 points  Months in reverse 4 points 4 points 0 points  Repeat phrase 10 points 10 points 10 points  Total Score 21 points 14 points 10 points    Immunizations Immunization History  Administered Date(s) Administered   COVID-19, mRNA, vaccine(Comirnaty)12 years and older 03/03/2022   Fluad Quad(high Dose 65+) 02/04/2019, 12/25/2020, 02/03/2022   Influenza,inj,Quad PF,6+ Mos 02/02/2015   Influenza-Unspecified 01/02/2014, 01/11/2016, 12/23/2019  Moderna SARS-COV2 Booster Vaccination 01/20/2020   PFIZER(Purple Top)SARS-COV-2 Vaccination 04/03/2019, 05/14/2019, 01/20/2020   Pfizer Covid-19 Vaccine Bivalent Booster 42yr & up 12/25/2020   Pneumococcal Conjugate-13 04/24/2014   Pneumococcal Polysaccharide-23 03/15/2013   Respiratory Syncytial Virus Vaccine,Recomb Aduvanted(Arexvy) 03/03/2022   Tdap 03/24/2012    TDAP status: Due, Education has been provided regarding the importance of this vaccine. Advised may receive this vaccine at local pharmacy or Health Dept. Aware to provide a copy of the vaccination record if obtained from local pharmacy or Health Dept. Verbalized  acceptance and understanding.  Flu Vaccine status: Up to date  Pneumococcal vaccine status: Up to date  Covid-19 vaccine status: Information provided on how to obtain vaccines.   Qualifies for Shingles Vaccine? Yes   Zostavax completed No   Shingrix Completed?: No.    Education has been provided regarding the importance of this vaccine. Patient has been advised to call insurance company to determine out of pocket expense if they have not yet received this vaccine. Advised may also receive vaccine at local pharmacy or Health Dept. Verbalized acceptance and understanding.  Screening Tests Health Maintenance  Topic Date Due   Medicare Annual Wellness (AWV)  02/12/2022   DTaP/Tdap/Td (2 - Td or Tdap) 03/24/2022   Zoster Vaccines- Shingrix (1 of 2) 03/25/2023 (Originally 10/05/1944)   COVID-19 Vaccine (5 - 2023-24 season) 04/28/2022   Pneumonia Vaccine 89 Years old  Completed   INFLUENZA VACCINE  Completed   DEXA SCAN  Completed   HPV VACCINES  Aged Out    Health Maintenance  Health Maintenance Due  Topic Date Due   Medicare Annual Wellness (AWV)  02/12/2022   DTaP/Tdap/Td (2 - Td or Tdap) 03/24/2022    Colorectal cancer screening: No longer required.   Mammogram status: No longer required due to aged out.  Bone Density status: Completed 2020. Results reflect: Bone density results: OSTEOPENIA. Repeat every 2 years.  Lung Cancer Screening: (Low Dose CT Chest recommended if Age 941-80years, 30 pack-year currently smoking OR have quit w/in 15years.) does not qualify.   Lung Cancer Screening Referral: na  Additional Screening:  Hepatitis C Screening: does not qualify  Vision Screening: Recommended annual ophthalmology exams for early detection of glaucoma and other disorders of the eye. Is the patient up to date with their annual eye exam?  Yes  Who is the provider or what is the name of the office in which the patient attends annual eye exams? [facility] If pt is not established  with a provider, would they like to be referred to a provider to establish care? No .   Dental Screening: Recommended annual dental exams for proper oral hygiene  Community Resource Referral / Chronic Care Management: CRR required this visit?  No   CCM required this visit?  No      Plan:     I have personally reviewed and noted the following in the patient's chart:   Medical and social history Use of alcohol, tobacco or illicit drugs  Current medications and supplements including opioid prescriptions. Patient is not currently taking opioid prescriptions. Functional ability and status Nutritional status Physical activity Advanced directives List of other physicians Hospitalizations, surgeries, and ER visits in previous 12 months Vitals Screenings to include cognitive, depression, and falls Referrals and appointments  In addition, I have reviewed and discussed with patient certain preventive protocols, quality metrics, and best practice recommendations. A written personalized care plan for preventive services as well as general preventive health recommendations were provided to  patient.     Lauree Chandler, NP   03/26/2022   .Virtual Visit via Video Note  I connected with Krystal [name] by a video enabled telemedicine application and verified that I am speaking with the correct person using two identifiers.  Location: Patient: home Provider: [place]   I discussed the limitations of evaluation and management by telemedicine and the availability of in person appointments. The patient expressed understanding and agreed to proceed.    I discussed the assessment and treatment plan with the patient. The patient was provided an opportunity to ask questions and all were answered. The patient agreed with the plan and demonstrated an understanding of the instructions.   The patient was advised to call back or seek an in-person evaluation if the  symptoms worsen or if the condition fails to improve as anticipated.  I provided 15 minutes of non-face-to-face time during this encounter.  Carlos American. Dewaine Oats, AGNP Avs printed and mailed.

## 2022-03-26 NOTE — Telephone Encounter (Signed)
[name], kathan are scheduled for a virtual visit with your provider today.    Just as we do with appointments in the office, we must obtain your consent to participate.  Your consent will be active for this visit and any virtual visit you may have with one of our providers in the next 365 days.    If you have a MyChart account, I can also send a copy of this consent to you electronically.  All virtual visits are billed to your insurance company just like a traditional visit in the office.  As this is a virtual visit, video technology does not allow for your provider to perform a traditional examination.  This may limit your provider's ability to fully assess your condition.  If your provider identifies any concerns that need to be evaluated in person or the need to arrange testing such as labs, EKG, etc, we will make arrangements to do so.    Although advances in technology are sophisticated, we cannot ensure that it will always work on either your end or our end.  If the connection with a video visit is poor, we may have to switch to a telephone visit.  With either a video or telephone visit, we are not always able to ensure that we have a secure connection.   I need to obtain your verbal consent now.   Are you willing to proceed with your visit today?   Krystal [name] has provided verbal consent on 03/26/2022 for a virtual visit (video or telephone).   Leigh Aurora [place], [place] 03/26/2022  11:07 AM

## 2022-04-06 ENCOUNTER — Other Ambulatory Visit (HOSPITAL_BASED_OUTPATIENT_CLINIC_OR_DEPARTMENT_OTHER): Payer: Self-pay

## 2022-04-06 ENCOUNTER — Other Ambulatory Visit: Payer: Self-pay

## 2022-04-06 ENCOUNTER — Emergency Department (HOSPITAL_BASED_OUTPATIENT_CLINIC_OR_DEPARTMENT_OTHER): Payer: Medicare Other

## 2022-04-06 ENCOUNTER — Emergency Department (HOSPITAL_BASED_OUTPATIENT_CLINIC_OR_DEPARTMENT_OTHER)
Admission: EM | Admit: 2022-04-06 | Discharge: 2022-04-06 | Disposition: A | Discharge status: Home / Self Care | Payer: Medicare Other | Attending: Emergency Medicine | Admitting: Emergency Medicine

## 2022-04-06 ENCOUNTER — Encounter (HOSPITAL_BASED_OUTPATIENT_CLINIC_OR_DEPARTMENT_OTHER): Payer: Self-pay

## 2022-04-06 DIAGNOSIS — S32020A Wedge compression fracture of second lumbar vertebra, initial encounter for closed fracture: Secondary | ICD-10-CM | POA: Diagnosis not present

## 2022-04-06 DIAGNOSIS — S3210XA Unspecified fracture of sacrum, initial encounter for closed fracture: Secondary | ICD-10-CM | POA: Insufficient documentation

## 2022-04-06 DIAGNOSIS — X58XXXA Exposure to other specified factors, initial encounter: Secondary | ICD-10-CM | POA: Insufficient documentation

## 2022-04-06 DIAGNOSIS — R600 Localized edema: Secondary | ICD-10-CM | POA: Diagnosis not present

## 2022-04-06 DIAGNOSIS — S3992XA Unspecified injury of lower back, initial encounter: Secondary | ICD-10-CM | POA: Diagnosis present

## 2022-04-06 LAB — URINALYSIS, ROUTINE W REFLEX MICROSCOPIC
Bilirubin Urine: NEGATIVE
Glucose, UA: NEGATIVE mg/dL
Ketones, ur: NEGATIVE mg/dL
Nitrite: NEGATIVE
Protein, ur: NEGATIVE mg/dL
Specific Gravity, Urine: 1.018 (ref 1.005–1.030)
pH: 5.5 (ref 5.0–8.0)

## 2022-04-06 MED ORDER — CEPHALEXIN 500 MG PO CAPS
500.0000 mg | ORAL_CAPSULE | Freq: Two times a day (BID) | ORAL | 0 refills | Status: AC
  Filled 2022-04-06: qty 10, 5d supply, fill #0

## 2022-04-06 MED ORDER — CEPHALEXIN 250 MG PO CAPS
500.0000 mg | ORAL_CAPSULE | Freq: Once | ORAL | Status: AC
  Administered 2022-04-06: 500 mg via ORAL
  Filled 2022-04-06: qty 2

## 2022-04-06 MED ORDER — LIDOCAINE 5 % EX PTCH
1.0000 | MEDICATED_PATCH | CUTANEOUS | Status: DC
  Administered 2022-04-06: 1 via TRANSDERMAL
  Filled 2022-04-06: qty 1

## 2022-04-06 MED ORDER — HYDROCODONE-ACETAMINOPHEN 5-325 MG PO TABS
1.0000 | ORAL_TABLET | ORAL | 0 refills | Status: DC | PRN
  Filled 2022-04-06: qty 10, 2d supply, fill #0

## 2022-04-06 NOTE — ED Triage Notes (Signed)
Patient here POV from Home.  Endorses Lower Back Pain for approximately 1.5 weeks. No Known Trauma or Injury. Across Lower Back. No Dysuria noted. No radiation of Pain per Patient and No N/V/D or Fevers.   Typically uses no Assist for Ambulation but has been using Cane recently.   NAD Noted during Triage. A&Ox4. GCS 15. BIB Wheelchair.

## 2022-04-06 NOTE — Discharge Instructions (Signed)
Follow-up with neurosurgery, call to schedule an appointment. Call your primary care provider's office to discuss options in case this becomes necessary. Tylenol as needed as directed, consider lidocaine patch. Norco as needed as prescribed for pain, only give if patient is with someone/observed. Colace is taking Norco to avoid constipation.

## 2022-04-06 NOTE — ED Provider Notes (Signed)
[facility] DEPT Provider Note   CSN: [id number] Arrival date & time: 04/06/22  1350     History  Chief Complaint  Patient presents with   Back Pain    Krystal [name] is a 89 y.o. female.  89 year old female lives independently, brought in by daughter with concern for pain across her lower back for the past week and a half without known falls or injuries.  Patient normally walks without assistive device, is using a cane recently.  Denies changes in bowel or bladder habits, abdominal pain, groin numbness.  Pain does not radiate into the legs.  No history of prior back problems.  No other complaints or concerns today.       Home Medications Prior to Admission medications   Medication Sig Start Date End Date Taking? Authorizing Provider  cephALEXin (KEFLEX) 500 MG capsule Take 1 capsule (500 mg total) by mouth 2 (two) times daily for 5 days. 04/06/22 04/11/22 Yes Tacy Learn, PA-C  HYDROcodone-acetaminophen (NORCO/VICODIN) 5-325 MG tablet Take 1 tablet by mouth every 4 (four) hours as needed. 04/06/22  Yes Tacy Learn, PA-C  amLODipine (NORVASC) 5 MG tablet TAKE 1 TABLET BY MOUTH  DAILY 09/09/21   Lauree Chandler, NP  Calcium Carbonate-Vit D-Min (CALCIUM 1200 PO) Take 1 tablet by mouth daily.    [provider]  Cholecalciferol (VITAMIN D) 2000 UNITS CAPS Take 1 capsule (2,000 Units total) by mouth daily. 04/24/14   Reed, Tiffany L, DO  donepezil (ARICEPT) 10 MG tablet TAKE 1 TABLET BY MOUTH AT  BEDTIME 11/22/21   Lauree Chandler, NP  levothyroxine (SYNTHROID) 25 MCG tablet TAKE 1 TABLET BY MOUTH ONCE DAILY BEFORE BREAKFAST ON  AN EMPTY STOMACH FOR  THYROID 09/09/21   Lauree Chandler, NP  Multiple Vitamins-Minerals (SENIOR MULTIVITAMIN PLUS PO) Take by mouth every morning.    [provider]  olmesartan (BENICAR) 20 MG tablet TAKE 1 TABLET BY MOUTH  DAILY 09/09/21   Lauree Chandler, NP  Omega-3 Fatty Acids (FISH OIL) 1000 MG  CAPS Take 1,000 capsules by mouth daily.    [provider]  Polyethyl Glycol-Propyl Glycol (SYSTANE OP) Apply 1 drop to eye as needed.    [provider]  simvastatin (ZOCOR) 20 MG tablet TAKE 1 TABLET BY MOUTH  DAILY FOR CHOLESTEROL 09/09/21   Lauree Chandler, NP      Allergies    Patient has no known allergies.    Review of Systems   Review of Systems Negative except as per HPI Physical Exam Updated Vital Signs BP (!) 165/62   Pulse 81   Temp 97.8 F (36.6 C) (Oral)   Resp 18   Ht '5\' 1"'$  (1.549 m)   Wt 61.7 kg   SpO2 93%   BMI 25.70 kg/m  Physical Exam Vitals and nursing note reviewed.  Constitutional:      General: She is not in acute distress.    Appearance: She is well-developed. She is not diaphoretic.  HENT:     Head: Normocephalic and atraumatic.  Cardiovascular:     Pulses: Normal pulses.  Pulmonary:     Effort: Pulmonary effort is normal.  Abdominal:     Palpations: Abdomen is soft.     Tenderness: There is no abdominal tenderness.  Musculoskeletal:        General: No tenderness.     Thoracic back: No tenderness or bony tenderness.     Lumbar back: No tenderness or bony tenderness.  Negative right straight leg raise test and negative left straight leg raise test.     Right lower leg: Edema present.     Left lower leg: Edema present.     Comments: Daughter reports lower extremity swelling to be at baseline.  Skin:    General: Skin is warm and dry.  Neurological:     Mental Status: She is alert and oriented to person, place, and time.     Sensory: No sensory deficit.     Motor: No weakness.  Psychiatric:        Behavior: Behavior normal.     ED Results / Procedures / Treatments   Labs (all labs ordered are listed, but only abnormal results are displayed) Labs Reviewed  URINALYSIS, ROUTINE W REFLEX MICROSCOPIC - Abnormal; Notable for the following components:      Result Value   APPearance HAZY (*)    Hgb urine dipstick MODERATE  (*)    Leukocytes,Ua MODERATE (*)    Bacteria, UA RARE (*)    All other components within normal limits    EKG None  Radiology CT Lumbar Spine Wo Contrast  Result Date: 04/06/2022 CLINICAL DATA:  Initial evaluation for acute low back pain, increased fracture risk. EXAM: CT LUMBAR SPINE WITHOUT CONTRAST TECHNIQUE: Multidetector CT imaging of the lumbar spine was performed without intravenous contrast administration. Multiplanar CT image reconstructions were also generated. RADIATION DOSE REDUCTION: This exam was performed according to the departmental dose-optimization program which includes automated exposure control, adjustment of the mA and/or kV according to patient size and/or use of iterative reconstruction technique. COMPARISON:  None Available. FINDINGS: Segmentation: Transitional lumbosacral anatomy with partial sacralization of the L5 vertebral body. Alignment: Mild dextroscoliosis. 3-4 mm anterolisthesis of L3 on L4 and L4 on L5, chronic and facet mediated. Vertebrae: Acute to subacute compression fracture involving the superior endplate of L2 with up to 50% height loss and 4 mm bony retropulsion. No more than mild spinal stenosis at this level. Otherwise, vertebral body height maintained. There are additional acute to subacute fractures extending through the bilateral sacral ala, most characteristic of probable insufficiency type fractures (series 4, image 111). Fractures extend through the sacralized transverse processes of L5. Additional acute nondisplaced fracture of the left transverse process of L4. No discrete or worrisome osseous lesions. Visualized lower ribs are intact. Paraspinal and other soft tissues: Diffuse edema within the presacral space secondary to the evolving fractures. Paraspinous soft tissues demonstrate no other acute finding. Advanced aorto bi-iliac atherosclerotic disease. Bilateral renal atrophy noted. Colonic diverticulosis noted as well. Moderate to large hiatal  hernia partially visualized. Disc levels: L1-2: 4 mm bony retropulsion related to the L2 compression fracture. Mild to moderate facet hypertrophy. Resultant mild spinal stenosis. Mild bilateral L1 foraminal narrowing. L2-3:  Normal interspace.  Mild facet hypertrophy.  No stenosis. L3-4: Anterolisthesis. Associated disc desiccation with broad posterior pseudo disc bulge/uncovering. Severe bilateral facet arthropathy. Resultant severe canal with moderate bilateral L3 foraminal stenosis. L4-5: Anterolisthesis. Associated broad posterior pseudo disc bulge uncovering, asymmetric to the left. Severe bilateral facet arthrosis. Resultant moderate to severe canal with bilateral subarticular stenosis. Severe left with moderate right L4 foraminal narrowing. L5-S1: Degenerative intervertebral disc space narrowing with disc bulge and endplate spurring. Moderate right worse than left facet arthrosis. No spinal stenosis. Mild to moderate right L5 foraminal narrowing. Left neural foramina remains patent. IMPRESSION: 1. Acute to subacute compression fracture involving the superior endplate of L2 with up to 50% height loss and 4 mm bony retropulsion.  Resultant mild spinal stenosis at this level. 2. Additional acute to subacute nondisplaced fractures extending through the bilateral sacral ala. Associated edema within the presacral space. 3. Acute nondisplaced fracture of the left transverse process of L4. 4. Transitional lumbosacral anatomy with partial sacralization of the L5 vertebral body. 5. Multifactorial degenerative changes at L3-4 and L4-5 with resultant moderate to severe canal with bilateral L3 and L4 foraminal stenosis. 6. Moderate to large hiatal hernia. Aortic Atherosclerosis (ICD10-I70.0). Electronically Signed   By: Jeannine Boga M.D.   On: 04/06/2022 18:00    Procedures Procedures    Medications Ordered in ED Medications  lidocaine (LIDODERM) 5 % 1 patch (1 patch Transdermal Patch Applied 04/06/22 1755)   cephALEXin (KEFLEX) capsule 500 mg (500 mg Oral Given 04/06/22 1755)    ED Course/ Medical Decision Making/ A&P                             Medical Decision Making Amount and/or Complexity of Data Reviewed Radiology: ordered.   89 year old female with complaint of low back pain as above.  Pain is worse with movement, no pain with palpation across her back or straight leg raise.  Equal leg strength, no sensory deficits.  Abdomen soft nontender.  Urinalysis reveals moderate hemoglobin with moderate leukocytes, rare bacteria, 21-50 white blood cells.  Pending CT lumbar spine, plan is to treat for UTI, will apply Lidoderm patch.  Does not have frequent urinary tract infections.  CT with multiple findings, relevant to patient's presentation today- concern for sacral ala fracture. Discussed results with ER attending, Dr. Oswald Hillock, patient was offered TLSO, declines which seems reasonable and likely to cause her more discomfort. Daughter is agreeable with neurosurg referral and follow up plan. Recommend call PCP office to discuss rehab placement in case this becomes necessary as daughter is unable to care for patient overnight and will be leaving town next week. Patient has in home help for 4 hours each day. Face to face completed for in home health services. Norco rx with discussion regarding fall risk and need to only take this medication if she is with a caretaker. Daughter verbalizes understanding.         Final Clinical Impression(s) / ED Diagnoses Final diagnoses:  Closed fracture of sacrum, unspecified portion of sacrum, initial encounter [facility])    Rx / DC Orders ED Discharge Orders          Ordered    HYDROcodone-acetaminophen (NORCO/VICODIN) 5-325 MG tablet  Every 4 hours PRN        04/06/22 1819    cephALEXin (KEFLEX) 500 MG capsule  2 times daily        04/06/22 1831              Tacy Learn, PA-C 04/06/22 1844    Tretha Sciara, MD 04/07/22 2217

## 2022-04-07 ENCOUNTER — Other Ambulatory Visit (HOSPITAL_BASED_OUTPATIENT_CLINIC_OR_DEPARTMENT_OTHER): Payer: Self-pay

## 2022-04-07 ENCOUNTER — Encounter: Payer: Self-pay | Admitting: Nurse Practitioner

## 2022-04-08 DIAGNOSIS — S3210XG Unspecified fracture of sacrum, subsequent encounter for fracture with delayed healing: Secondary | ICD-10-CM | POA: Diagnosis not present

## 2022-04-10 ENCOUNTER — Inpatient Hospital Stay: Payer: Medicare Other | Admitting: Adult Health

## 2022-04-11 ENCOUNTER — Ambulatory Visit (INDEPENDENT_AMBULATORY_CARE_PROVIDER_SITE_OTHER): Payer: Medicare Other | Admitting: Adult Health

## 2022-04-11 ENCOUNTER — Other Ambulatory Visit (HOSPITAL_BASED_OUTPATIENT_CLINIC_OR_DEPARTMENT_OTHER): Payer: Self-pay

## 2022-04-11 ENCOUNTER — Encounter: Payer: Self-pay | Admitting: Adult Health

## 2022-04-11 VITALS — BP 110/68 | HR 67 | Temp 97.5°F | Resp 22 | Ht 61.0 in | Wt 140.0 lb

## 2022-04-11 DIAGNOSIS — S3210XS Unspecified fracture of sacrum, sequela: Secondary | ICD-10-CM

## 2022-04-11 DIAGNOSIS — N3000 Acute cystitis without hematuria: Secondary | ICD-10-CM

## 2022-04-11 DIAGNOSIS — I10 Essential (primary) hypertension: Secondary | ICD-10-CM | POA: Diagnosis not present

## 2022-04-11 MED ORDER — HYDROCODONE-ACETAMINOPHEN 5-325 MG PO TABS
1.0000 | ORAL_TABLET | Freq: Two times a day (BID) | ORAL | 0 refills | Status: DC | PRN
  Filled 2022-04-11: qty 15, 8d supply, fill #0

## 2022-04-11 NOTE — Progress Notes (Signed)
[facility]  Provider: Durenda Age DNP  Code Status:  Full Code  Goals of Care:     04/06/2022    2:00 PM  Advanced Directives  Does Patient Have a Medical Advance Directive? No  Does patient want to make changes to medical advance directive? No - Patient declined  Would patient like information on creating a medical advance directive? No - Patient declined     Chief Complaint  Patient presents with   Follow-up    Patient is here for follow up after hospital stay     HPI: Patient is a 89 y.o. female seen today for hospital follow up. She was in the [facility] on 04/06/22 for  pain across her lower back. She usually walks without any assistive device but was seen using a cane. Urinalysis showed moderate hemoglobin, moderate leukocytes, rare bacteria and 21-50 WBCs. CT showed concern for ala fracture. She was offered TLSO but declined. Daughter agreed for neurosurgery referral. She was prescribed Norco for pain and Cephalexin for UTI.  Daughter accompanied patient today. She was noted to be on a wheelchair today. Patient stated that her lower back hurts like "hell". Daughter stated that patient does not complain of pain so it was hard for her to know if she is in pain. Discussed that patient's facial expression and BP/HR can tell if patient is in pain.  Past Medical History:  Diagnosis Date   Arthritis    Glaucoma    Hyperlipidemia    Hypertension    Osteoporosis    Thyroid disease     Past Surgical History:  Procedure Laterality Date   ABDOMINAL HYSTERECTOMY  1965   partial   CHOLECYSTECTOMY  1989   SKIN SURGERY  02/2014   Basel Cell removed from right side of face     No Known Allergies  Outpatient Encounter Medications as of 04/11/2022  Medication Sig   amLODipine (NORVASC) 5 MG tablet TAKE 1 TABLET BY MOUTH  DAILY   Calcium Carbonate-Vit D-Min (CALCIUM 1200 PO) Take 1 tablet by mouth daily.   cephALEXin (KEFLEX) 500 MG capsule Take 1  capsule (500 mg total) by mouth 2 (two) times daily for 5 days.   Cholecalciferol (VITAMIN D) 2000 UNITS CAPS Take 1 capsule (2,000 Units total) by mouth daily.   donepezil (ARICEPT) 10 MG tablet TAKE 1 TABLET BY MOUTH AT  BEDTIME   HYDROcodone-acetaminophen (NORCO/VICODIN) 5-325 MG tablet Take 1 tablet by mouth every 4 (four) hours as needed.   levothyroxine (SYNTHROID) 25 MCG tablet TAKE 1 TABLET BY MOUTH ONCE DAILY BEFORE BREAKFAST ON  AN EMPTY STOMACH FOR  THYROID   Multiple Vitamins-Minerals (SENIOR MULTIVITAMIN PLUS PO) Take by mouth every morning.   olmesartan (BENICAR) 20 MG tablet TAKE 1 TABLET BY MOUTH  DAILY   Omega-3 Fatty Acids (FISH OIL) 1000 MG CAPS Take 1,000 capsules by mouth daily.   Polyethyl Glycol-Propyl Glycol (SYSTANE OP) Apply 1 drop to eye as needed.   simvastatin (ZOCOR) 20 MG tablet TAKE 1 TABLET BY MOUTH  DAILY FOR CHOLESTEROL   No facility-administered encounter medications on file as of 04/11/2022.    Review of Systems:  Review of Systems  Health Maintenance  Topic Date Due   DTaP/Tdap/Td (2 - Td or Tdap) 03/24/2022   Zoster Vaccines- Shingrix (1 of 2) 03/25/2023 (Originally 10/05/1944)   COVID-19 Vaccine (5 - 2023-24 season) 04/28/2022   Medicare Annual Wellness (AWV)  03/27/2023   Pneumonia Vaccine 38+ Years old  Completed  INFLUENZA VACCINE  Completed   DEXA SCAN  Completed   HPV VACCINES  Aged Out    Physical Exam: Vitals:   04/11/22 1350  BP: 110/68  Pulse: 67  Resp: (!) 22  Temp: (!) 97.5 F (36.4 C)  SpO2: 98%  Weight: 140 lb (63.5 kg)  Height: '5\' 1"'$  (1.549 m)   Body mass index is 26.45 kg/m. Physical Exam  Labs reviewed: Basic Metabolic Panel: Recent Labs    06/10/21 1440 10/21/21 1042 01/20/22 1500  NA 140 139 142  K 4.1 4.3 3.8  CL 105 105 106  CO2 '23 23 28  '$ GLUCOSE 111 95 114*  BUN '23 13 17  '$ CREATININE 1.24* 0.98* 0.89  CALCIUM 9.7 9.5 9.6   Liver Function Tests: Recent Labs    06/10/21 1440 10/21/21 1042  AST  19 18  ALT 9 9  BILITOT 1.0 1.0  PROT 6.1 6.0*   No results for input(s): "LIPASE", "AMYLASE" in the last 8760 hours. No results for input(s): "AMMONIA" in the last 8760 hours. CBC: Recent Labs    06/10/21 1440 10/21/21 1042  WBC 13.2* 9.7  NEUTROABS 9,636* 3,948  HGB 13.9 14.3  HCT 41.4 42.0  MCV 93.2 95.0  PLT 207 204   Lipid Panel: Recent Labs    06/10/21 1440  CHOL 142  HDL 66  LDLCALC 62  TRIG 65  CHOLHDL 2.2   Lab Results  Component Value Date   HGBA1C 5.5 11/02/2020    Procedures since last visit: CT Lumbar Spine Wo Contrast  Result Date: 04/06/2022 CLINICAL DATA:  Initial evaluation for acute low back pain, increased fracture risk. EXAM: CT LUMBAR SPINE WITHOUT CONTRAST TECHNIQUE: Multidetector CT imaging of the lumbar spine was performed without intravenous contrast administration. Multiplanar CT image reconstructions were also generated. RADIATION DOSE REDUCTION: This exam was performed according to the departmental dose-optimization program which includes automated exposure control, adjustment of the mA and/or kV according to patient size and/or use of iterative reconstruction technique. COMPARISON:  None Available. FINDINGS: Segmentation: Transitional lumbosacral anatomy with partial sacralization of the L5 vertebral body. Alignment: Mild dextroscoliosis. 3-4 mm anterolisthesis of L3 on L4 and L4 on L5, chronic and facet mediated. Vertebrae: Acute to subacute compression fracture involving the superior endplate of L2 with up to 50% height loss and 4 mm bony retropulsion. No more than mild spinal stenosis at this level. Otherwise, vertebral body height maintained. There are additional acute to subacute fractures extending through the bilateral sacral ala, most characteristic of probable insufficiency type fractures (series 4, image 111). Fractures extend through the sacralized transverse processes of L5. Additional acute nondisplaced fracture of the left transverse  process of L4. No discrete or worrisome osseous lesions. Visualized lower ribs are intact. Paraspinal and other soft tissues: Diffuse edema within the presacral space secondary to the evolving fractures. Paraspinous soft tissues demonstrate no other acute finding. Advanced aorto bi-iliac atherosclerotic disease. Bilateral renal atrophy noted. Colonic diverticulosis noted as well. Moderate to large hiatal hernia partially visualized. Disc levels: L1-2: 4 mm bony retropulsion related to the L2 compression fracture. Mild to moderate facet hypertrophy. Resultant mild spinal stenosis. Mild bilateral L1 foraminal narrowing. L2-3:  Normal interspace.  Mild facet hypertrophy.  No stenosis. L3-4: Anterolisthesis. Associated disc desiccation with broad posterior pseudo disc bulge/uncovering. Severe bilateral facet arthropathy. Resultant severe canal with moderate bilateral L3 foraminal stenosis. L4-5: Anterolisthesis. Associated broad posterior pseudo disc bulge uncovering, asymmetric to the left. Severe bilateral facet arthrosis. Resultant moderate to severe canal  with bilateral subarticular stenosis. Severe left with moderate right L4 foraminal narrowing. L5-S1: Degenerative intervertebral disc space narrowing with disc bulge and endplate spurring. Moderate right worse than left facet arthrosis. No spinal stenosis. Mild to moderate right L5 foraminal narrowing. Left neural foramina remains patent. IMPRESSION: 1. Acute to subacute compression fracture involving the superior endplate of L2 with up to 50% height loss and 4 mm bony retropulsion. Resultant mild spinal stenosis at this level. 2. Additional acute to subacute nondisplaced fractures extending through the bilateral sacral ala. Associated edema within the presacral space. 3. Acute nondisplaced fracture of the left transverse process of L4. 4. Transitional lumbosacral anatomy with partial sacralization of the L5 vertebral body. 5. Multifactorial degenerative changes at  L3-4 and L4-5 with resultant moderate to severe canal with bilateral L3 and L4 foraminal stenosis. 6. Moderate to large hiatal hernia. Aortic Atherosclerosis (ICD10-I70.0). Electronically Signed   By: Jeannine Boga M.D.   On: 04/06/2022 18:00    Assessment/Plan  1. Closed fracture of sacrum, unspecified portion of sacrum, sequela -  will follow up with neurosurgery - HYDROcodone-acetaminophen (NORCO/VICODIN) 5-325 MG tablet; Take 1 tablet by mouth 2 (two) times daily as needed.  Dispense: 15 tablet; Refill: 0  2. Acute cystitis without hematuria -  completed course of Cephalexin  3. Essential hypertension, benign -  BP 110/68, stable -  continue Olmesartan  Labs/tests ordered:  None  Next appt:  as needed

## 2022-04-11 NOTE — Patient Instructions (Signed)
Lumbar Spine Fracture A lumbar spine fracture is a break in one of the bones of the lower back. Lumbar spine fractures can vary from mild to severe. The most severe types are those that: Cause the broken bones to move out of place (unstable). Injure or press on the spinal cord. Have multiple breaks in the bones and damage to nearby tissues (complex). During recovery, it is normal to have pain and stiffness in the lower back for several weeks. What are the causes? This condition may be caused by: A fall. A car accident. A gunshot wound. A hard, direct hit to the back. What increases the risk? You are more likely to develop this condition if: You are in a situation that could result in a fall or other violent injury. You have a condition that causes weakness in the bones (osteoporosis). What are the signs or symptoms? The main symptom of this condition is severe pain in the lower back. If a fracture is complex or severe, there may also be: An unusual shape or swollen area on the lower back. Limited ability to move an area of the lower back. Inability to empty the bladder (urinary retention). Inability to control when you urinate or have bowel movements (incontinence). Loss of strength or sensation in the legs, feet, and toes. Inability to move (paralysis). How is this diagnosed? This condition is diagnosed based on: A physical exam. Symptoms and what happened just before they developed. The results of imaging tests, such as an X-ray, CT scan, or MRI. If your nerves have been damaged, you may also have other tests to find out the extent of the damage. How is this treated? Treatment for this condition depends on how severe the injury is. Most fractures can be treated with: A back brace. Bed rest and limits on your activity. Pain medicine. Physical therapy. Fractures that are complex, involve multiple bones, or make the spine unstable may require surgery. Surgery is done: To remove  pressure from the nerves or spinal cord. To stabilize the broken pieces of bone. Follow these instructions at home: Medicines Take over-the-counter and prescription medicines only as told by your health care provider. Ask your health care provider if the medicine prescribed to you: Requires you to avoid driving or using machinery. Can cause constipation. You may need to take these actions to prevent or treat constipation: Drink enough fluid to keep your urine pale yellow. Take over-the-counter or prescription medicines. Eat foods that are high in fiber, such as beans, whole grains, and fresh fruits and vegetables. Limit foods that are high in fat and processed sugars, such as fried or sweet foods. If you have a back brace: Wear the back brace as told by your health care provider. Remove it only as told by your health care provider. Check the skin around the brace every day. Tell your health care provider about any concerns. Keep the brace clean. If the brace is not waterproof: Do not let it get wet. Cover it with a watertight covering when you take a bath or a shower. Ask your health care provider when it is safe to drive. Activity Rest as told by your health care provider. Do exercises as told by your health care provider. Return to your normal activities as told by your health care provider. Ask your health care provider what activities are safe for you. Managing pain, stiffness, and swelling If directed, put ice on the injured area. To do this: If you have a removable brace, remove  it only as told by your health care provider. Put ice in a plastic bag. Place a towel between your skin and the bag. Leave the ice on for 20 minutes, 2-3 times a day. If your skin turns bright red, remove the ice right away to prevent skin damage. The risk of skin damage is higher if you cannot feel pain, heat, or cold.  General instructions Do not use any products that contain nicotine or tobacco.  These products include cigarettes, chewing tobacco, and vaping devices, such e-cigarettes. These can delay bone healing. If you need help quitting, ask your health care provider. Do not drink alcohol. Keep all follow-up visits. Failing to follow up as recommended could result in permanent injury, disability, or long-lasting (chronic) pain. Where to find more information [employer]: orthoinfo.aaos.org Contact a health care provider if: You have a fever. Your pain medicine is not helping. Your pain does not get better over time. You cannot return to your normal activities as planned or expected. Get help right away if: You have difficulty breathing. Your pain is very bad and it suddenly gets worse. You have numbness, tingling, or weakness in any part of your body. You are unable to empty your bladder. You cannot control when you urinate or have bowel movements. You are unable to move any body part that is below the level of your injury. You have pain in your abdomen or uncontrolled vomiting. You have a warm, tender swelling in your leg. These symptoms may be an emergency. Get help right away. Call 911. Do not wait to see if the symptoms will go away. Do not drive yourself to the hospital. Summary A lumbar spine fracture is a break in one of the bones of the lower back. The main symptom of this condition is severe pain in the lower back. If a fracture is complex or severe, there may also be numbness, tingling, or paralysis in the legs. Most fractures can be treated with a back brace, pain medicine, bed rest and limits on activity, and physical therapy. Fractures that are complex, involve multiple bones, or make the spine unstable may require surgery. This information is not intended to replace advice given to you by your health care provider. Make sure you discuss any questions you have with your health care provider. Document Revised: 07/05/2021 Document  Reviewed: 07/05/2021 Elsevier Patient Education  [place].

## 2022-04-12 ENCOUNTER — Other Ambulatory Visit (HOSPITAL_BASED_OUTPATIENT_CLINIC_OR_DEPARTMENT_OTHER): Payer: Self-pay

## 2022-04-14 ENCOUNTER — Other Ambulatory Visit: Payer: Self-pay | Admitting: Student

## 2022-04-14 DIAGNOSIS — S3210XG Unspecified fracture of sacrum, subsequent encounter for fracture with delayed healing: Secondary | ICD-10-CM

## 2022-04-15 ENCOUNTER — Other Ambulatory Visit (HOSPITAL_COMMUNITY): Payer: Self-pay | Admitting: Interventional Radiology

## 2022-04-15 ENCOUNTER — Ambulatory Visit
Admission: RE | Admit: 2022-04-15 | Discharge: 2022-04-15 | Disposition: A | Discharge status: Home / Self Care | Payer: Medicare Other | Source: Ambulatory Visit | Attending: Student | Admitting: Student

## 2022-04-15 DIAGNOSIS — S3210XG Unspecified fracture of sacrum, subsequent encounter for fracture with delayed healing: Secondary | ICD-10-CM

## 2022-04-15 DIAGNOSIS — M8008XA Age-related osteoporosis with current pathological fracture, vertebra(e), initial encounter for fracture: Secondary | ICD-10-CM | POA: Diagnosis not present

## 2022-04-15 LAB — COMPLETE METABOLIC PANEL WITH GFR
AG Ratio: 1.6 (calc) (ref 1.0–2.5)
ALT: 14 U/L (ref 6–29)
AST: 25 U/L (ref 10–35)
Albumin: 4 g/dL (ref 3.6–5.1)
Alkaline phosphatase (APISO): 393 U/L — ABNORMAL HIGH (ref 37–153)
BUN: 25 mg/dL (ref 7–25)
CO2: 24 mmol/L (ref 20–32)
Calcium: 9.3 mg/dL (ref 8.6–10.4)
Chloride: 103 mmol/L (ref 98–110)
Creat: 0.92 mg/dL (ref 0.60–0.95)
Globulin: 2.5 g/dL (calc) (ref 1.9–3.7)
Glucose, Bld: 99 mg/dL (ref 65–99)
Potassium: 4 mmol/L (ref 3.5–5.3)
Sodium: 138 mmol/L (ref 135–146)
Total Bilirubin: 0.7 mg/dL (ref 0.2–1.2)
Total Protein: 6.5 g/dL (ref 6.1–8.1)
eGFR: 58 mL/min/{1.73_m2} — ABNORMAL LOW (ref >=60)

## 2022-04-15 LAB — CBC
HCT: 39 % (ref 35.0–45.0)
Hemoglobin: 13.7 g/dL (ref 11.7–15.5)
MCH: 32.7 pg (ref 27.0–33.0)
MCHC: 35.1 g/dL (ref 32.0–36.0)
MCV: 93.1 fL (ref 80.0–100.0)
MPV: 9.4 fL (ref 7.5–12.5)
Platelets: 241 10*3/uL (ref 140–400)
RBC: 4.19 10*6/uL (ref 3.80–5.10)
RDW: 12.1 % (ref 11.0–15.0)
WBC: 11.2 10*3/uL — ABNORMAL HIGH (ref 3.8–10.8)

## 2022-04-15 NOTE — Consult Note (Signed)
Chief Complaint: Patient was seen in consultation today for sacral insufficiency fracture at the request of Viona Gilmore D  Referring Physician(s): Viona Gilmore D  History of Present Illness: Krystal [name] is a 89 y.o. female with a history of dementia who was in her usual state of health until earlier this month when she began to complain of severe sacral pain.  She was previously ambulatory and largely able to accomplish the majority of her activities of daily living.  However, for the past 3 weeks she has had great difficulty standing, walking, and changing positions.  At its worst, her pain was a 10 out of 10 on a 10 point scale.  Her pain was mildly disabling.  With her daughters help, she scores an 11 out of 24 on the [employer] disability questionnaire.  However, she has had a sudden change for the better yesterday and again today.  Yesterday for the first time in weeks she was able to get up and ambulate to the bathroom on her own.  Today she also seems to be resting more comfortably and complaining less of pain.  CT imaging from April 06, 2022 demonstrated an acute/subacute fracture of the superior endplate of L2 with approximately 40% height loss as well as bilateral insufficiency fractures of the sacral ala.  On questioning, the patient denies pain in the mid to lower back and endorses only pain in the sacral region.  As stated above, she is pleasantly demented and is unable to further describe her pain.  Past Medical History:  Diagnosis Date   Arthritis    Glaucoma    Hyperlipidemia    Hypertension    Osteoporosis    Thyroid disease     Past Surgical History:  Procedure Laterality Date   ABDOMINAL HYSTERECTOMY  1965   partial   CHOLECYSTECTOMY  1989   SKIN SURGERY  02/2014   Basel Cell removed from right side of face     Allergies: Patient has no known allergies.  Medications: Prior to Admission medications   Medication Sig Start Date End  Date Taking? Authorizing Provider  amLODipine (NORVASC) 5 MG tablet TAKE 1 TABLET BY MOUTH  DAILY 09/09/21   Lauree Chandler, NP  Calcium Carbonate-Vit D-Min (CALCIUM 1200 PO) Take 1 tablet by mouth daily.    [provider]  Cholecalciferol (VITAMIN D) 2000 UNITS CAPS Take 1 capsule (2,000 Units total) by mouth daily. 04/24/14   Reed, Tiffany L, DO  donepezil (ARICEPT) 10 MG tablet TAKE 1 TABLET BY MOUTH AT  BEDTIME 11/22/21   Lauree Chandler, NP  HYDROcodone-acetaminophen (NORCO/VICODIN) 5-325 MG tablet Take 1 tablet by mouth 2 (two) times daily as needed. 04/11/22   Medina-Vargas, Monina C, NP  levothyroxine (SYNTHROID) 25 MCG tablet TAKE 1 TABLET BY MOUTH ONCE DAILY BEFORE BREAKFAST ON  AN EMPTY STOMACH FOR  THYROID 09/09/21   Lauree Chandler, NP  Multiple Vitamins-Minerals (SENIOR MULTIVITAMIN PLUS PO) Take by mouth every morning.    [provider]  olmesartan (BENICAR) 20 MG tablet TAKE 1 TABLET BY MOUTH  DAILY 09/09/21   Lauree Chandler, NP  Omega-3 Fatty Acids (FISH OIL) 1000 MG CAPS Take 1,000 capsules by mouth daily.    [provider]  Polyethyl Glycol-Propyl Glycol (SYSTANE OP) Apply 1 drop to eye as needed.    [provider]  simvastatin (ZOCOR) 20 MG tablet TAKE 1 TABLET BY MOUTH  DAILY FOR CHOLESTEROL 09/09/21   Lauree Chandler, NP  Family History  Problem Relation Age of Onset   Cancer Mother    Cancer Father        lung   Heart disease Father     Social History   Socioeconomic History   Marital status: Widowed    Spouse name: Not on file   Number of children: Not on file   Years of education: Not on file   Highest education level: Not on file  Occupational History   Not on file  Tobacco Use   Smoking status: Never   Smokeless tobacco: Never   Tobacco comments:    never used tobacco  Vaping Use   Vaping Use: Never used  Substance and Sexual Activity   Alcohol use: No    Alcohol/week: 0.0 standard drinks of alcohol    Drug use: No   Sexual activity: Not Currently  Other Topics Concern   Not on file  Social History Narrative   Caffeine: Coffee   Widow, married in 1947   Lives in house, 1 stories, one person, one dog   Current/past profession: Network engineer   Exercise: No   Living Will: Yes   DNR: No, would not like to discuss              Social Determinants of Health   Financial Resource Strain: Low Risk  (02/01/2018)   Overall Financial Resource Strain (CARDIA)    Difficulty of Paying Living Expenses: Not hard at all  Food Insecurity: No Food Insecurity (02/01/2018)   Hunger Vital Sign    Worried About [place] in the Last Year: Never true    [place] in the Last Year: Never true  Transportation Needs: No Transportation Needs (02/01/2018)   PRAPARE - Hydrologist (Medical): No    Lack of Transportation (Non-Medical): No  Physical Activity: Inactive (02/01/2018)   Exercise Vital Sign    Days of Exercise per Week: 0 days    Minutes of Exercise per Session: 0 min  Stress: No Stress Concern Present (02/01/2018)   [place]    Feeling of Stress : Not at all  Social Connections: Moderately Isolated (02/01/2018)   Social Connection and Isolation Panel [NHANES]    Frequency of Communication with Friends and Family: More than three times a week    Frequency of Social Gatherings with Friends and Family: More than three times a week    Attends Religious Services: Never    Marine scientist or Organizations: No    Attends Archivist Meetings: Never    Marital Status: Widowed   Review of Systems: A 12 point ROS discussed and pertinent positives are indicated in the HPI above.  All other systems are negative.  Review of Systems  Vital Signs: BP (!) 153/72 (BP Location: Right Arm, Patient Position: Sitting, Cuff Size: Normal)   Pulse 81   Temp 98.2 F (36.8 C) (Oral)    Wt 63.5 kg   SpO2 94% Comment: room air  BMI 26.45 kg/m    Physical Exam Constitutional:      Appearance: Normal appearance.  HENT:     Head: Normocephalic and atraumatic.  Eyes:     General: No scleral icterus. Cardiovascular:     Rate and Rhythm: Normal rate.  Pulmonary:     Effort: Pulmonary effort is normal.  Abdominal:     General: Abdomen is flat. There is no distension.  Tenderness: There is no abdominal tenderness.  Musculoskeletal:        General: No tenderness.  Skin:    General: Skin is warm and dry.  Neurological:     Mental Status: She is alert.  Psychiatric:        Mood and Affect: Mood normal.        Behavior: Behavior normal.       Imaging: CT Lumbar Spine Wo Contrast  Result Date: 04/06/2022 CLINICAL DATA:  Initial evaluation for acute low back pain, increased fracture risk. EXAM: CT LUMBAR SPINE WITHOUT CONTRAST TECHNIQUE: Multidetector CT imaging of the lumbar spine was performed without intravenous contrast administration. Multiplanar CT image reconstructions were also generated. RADIATION DOSE REDUCTION: This exam was performed according to the departmental dose-optimization program which includes automated exposure control, adjustment of the mA and/or kV according to patient size and/or use of iterative reconstruction technique. COMPARISON:  None Available. FINDINGS: Segmentation: Transitional lumbosacral anatomy with partial sacralization of the L5 vertebral body. Alignment: Mild dextroscoliosis. 3-4 mm anterolisthesis of L3 on L4 and L4 on L5, chronic and facet mediated. Vertebrae: Acute to subacute compression fracture involving the superior endplate of L2 with up to 50% height loss and 4 mm bony retropulsion. No more than mild spinal stenosis at this level. Otherwise, vertebral body height maintained. There are additional acute to subacute fractures extending through the bilateral sacral ala, most characteristic of probable insufficiency type  fractures (series 4, image 111). Fractures extend through the sacralized transverse processes of L5. Additional acute nondisplaced fracture of the left transverse process of L4. No discrete or worrisome osseous lesions. Visualized lower ribs are intact. Paraspinal and other soft tissues: Diffuse edema within the presacral space secondary to the evolving fractures. Paraspinous soft tissues demonstrate no other acute finding. Advanced aorto bi-iliac atherosclerotic disease. Bilateral renal atrophy noted. Colonic diverticulosis noted as well. Moderate to large hiatal hernia partially visualized. Disc levels: L1-2: 4 mm bony retropulsion related to the L2 compression fracture. Mild to moderate facet hypertrophy. Resultant mild spinal stenosis. Mild bilateral L1 foraminal narrowing. L2-3:  Normal interspace.  Mild facet hypertrophy.  No stenosis. L3-4: Anterolisthesis. Associated disc desiccation with broad posterior pseudo disc bulge/uncovering. Severe bilateral facet arthropathy. Resultant severe canal with moderate bilateral L3 foraminal stenosis. L4-5: Anterolisthesis. Associated broad posterior pseudo disc bulge uncovering, asymmetric to the left. Severe bilateral facet arthrosis. Resultant moderate to severe canal with bilateral subarticular stenosis. Severe left with moderate right L4 foraminal narrowing. L5-S1: Degenerative intervertebral disc space narrowing with disc bulge and endplate spurring. Moderate right worse than left facet arthrosis. No spinal stenosis. Mild to moderate right L5 foraminal narrowing. Left neural foramina remains patent. IMPRESSION: 1. Acute to subacute compression fracture involving the superior endplate of L2 with up to 50% height loss and 4 mm bony retropulsion. Resultant mild spinal stenosis at this level. 2. Additional acute to subacute nondisplaced fractures extending through the bilateral sacral ala. Associated edema within the presacral space. 3. Acute nondisplaced fracture of  the left transverse process of L4. 4. Transitional lumbosacral anatomy with partial sacralization of the L5 vertebral body. 5. Multifactorial degenerative changes at L3-4 and L4-5 with resultant moderate to severe canal with bilateral L3 and L4 foraminal stenosis. 6. Moderate to large hiatal hernia. Aortic Atherosclerosis (ICD10-I70.0). Electronically Signed   By: Jeannine Boga M.D.   On: 04/06/2022 18:00    Labs:  CBC: Recent Labs    06/10/21 1440 10/21/21 1042  WBC 13.2* 9.7  HGB 13.9 14.3  HCT  41.4 42.0  PLT 207 204    COAGS: No results for input(s): "INR", "APTT" in the last 8760 hours.  BMP: Recent Labs    06/10/21 1440 10/21/21 1042 01/20/22 1500  NA 140 139 142  K 4.1 4.3 3.8  CL 105 105 106  CO2 '23 23 28  '$ GLUCOSE 111 95 114*  BUN '23 13 17  '$ CALCIUM 9.7 9.5 9.6  CREATININE 1.24* 0.98* 0.89    LIVER FUNCTION TESTS: Recent Labs    06/10/21 1440 10/21/21 1042  BILITOT 1.0 1.0  AST 19 18  ALT 9 9  PROT 6.1 6.0*    TUMOR MARKERS: No results for input(s): "AFPTM", "CEA", "CA199", "CHROMGRNA" in the last 8760 hours.  Assessment and Plan:  89 year old pleasantly demented female with subacute bilateral sacral and L2 compression fractures.  Currently, her L2 compression fracture is asymptomatic.  She was having severe symptoms related to her sacral insufficiency fracture, but her daughter has noticed a significant improvement over the past 2 days.  We discussed the natural history of sacral insufficiency fractures as well as treatment options including continued conservative management versus bilateral sacroplasty.   Given the severity of her symptoms just a few days ago, we are going to tentatively schedule her for bilateral sacroplasty on Tuesday, February 6.  Her daughter will continue to monitor her over the next 2 weeks.  If she demonstrates continued improvement, she will call to cancel the procedure.  M80.08XA Age-related osteoporosis with current  pathological fracture, vertebra(e), initial encounter for fracture  Thank you for this interesting consult.  I greatly enjoyed meeting Ivah Shadee Rathod and look forward to participating in their care.  A copy of this report was sent to the requesting provider on this date.  Electronically Signed: Criselda Peaches 04/15/2022, 2:32 PM

## 2022-04-29 ENCOUNTER — Other Ambulatory Visit: Payer: Medicare Other

## 2022-05-28 ENCOUNTER — Other Ambulatory Visit: Payer: Medicare Other

## 2022-06-24 ENCOUNTER — Other Ambulatory Visit: Payer: Self-pay | Admitting: Nurse Practitioner

## 2022-07-16 ENCOUNTER — Other Ambulatory Visit: Payer: Self-pay | Admitting: Nurse Practitioner

## 2022-09-15 DIAGNOSIS — H524 Presbyopia: Secondary | ICD-10-CM | POA: Diagnosis not present

## 2022-09-15 DIAGNOSIS — H401134 Primary open-angle glaucoma, bilateral, indeterminate stage: Secondary | ICD-10-CM | POA: Diagnosis not present

## 2022-09-15 DIAGNOSIS — H04123 Dry eye syndrome of bilateral lacrimal glands: Secondary | ICD-10-CM | POA: Diagnosis not present

## 2022-09-15 DIAGNOSIS — H26491 Other secondary cataract, right eye: Secondary | ICD-10-CM | POA: Diagnosis not present

## 2022-09-15 DIAGNOSIS — H52203 Unspecified astigmatism, bilateral: Secondary | ICD-10-CM | POA: Diagnosis not present

## 2022-09-15 DIAGNOSIS — H43813 Vitreous degeneration, bilateral: Secondary | ICD-10-CM | POA: Diagnosis not present

## 2022-10-25 ENCOUNTER — Other Ambulatory Visit: Payer: Self-pay | Admitting: Nurse Practitioner

## 2022-12-01 ENCOUNTER — Other Ambulatory Visit: Payer: Self-pay

## 2022-12-08 ENCOUNTER — Other Ambulatory Visit: Payer: Self-pay | Admitting: Nurse Practitioner

## 2022-12-09 ENCOUNTER — Other Ambulatory Visit: Payer: Self-pay | Admitting: Nurse Practitioner

## 2022-12-17 ENCOUNTER — Telehealth: Payer: Medicare Other

## 2022-12-17 NOTE — Telephone Encounter (Signed)
   Please advise on manufacture change request for levothyroxine

## 2022-12-17 NOTE — Telephone Encounter (Signed)
This is fine we will follow up TSH at next appt.

## 2022-12-17 NOTE — Telephone Encounter (Signed)
I called the pharmacy, spoke with Jace. Informed him that Shanda Bumps is in agreement with medication manufacture change. I called patient and left a detailed message requesting a return call to schedule a routine follow-up visit.

## 2023-01-14 ENCOUNTER — Other Ambulatory Visit (HOSPITAL_BASED_OUTPATIENT_CLINIC_OR_DEPARTMENT_OTHER): Payer: Self-pay

## 2023-01-14 DIAGNOSIS — Z23 Encounter for immunization: Secondary | ICD-10-CM | POA: Diagnosis not present

## 2023-01-14 MED ORDER — COVID-19 MRNA VAC-TRIS(PFIZER) 30 MCG/0.3ML IM SUSY
0.3000 mL | PREFILLED_SYRINGE | Freq: Once | INTRAMUSCULAR | 0 refills | Status: AC
  Filled 2023-01-14: qty 0.3, 1d supply, fill #0

## 2023-01-23 ENCOUNTER — Encounter: Payer: Self-pay | Admitting: Nurse Practitioner

## 2023-01-23 ENCOUNTER — Ambulatory Visit (INDEPENDENT_AMBULATORY_CARE_PROVIDER_SITE_OTHER): Payer: Medicare Other | Admitting: Nurse Practitioner

## 2023-01-23 VITALS — BP 122/68 | HR 71 | Temp 97.9°F | Ht 61.0 in | Wt 120.0 lb

## 2023-01-23 DIAGNOSIS — N1831 Chronic kidney disease, stage 3a: Secondary | ICD-10-CM | POA: Diagnosis not present

## 2023-01-23 DIAGNOSIS — R634 Abnormal weight loss: Secondary | ICD-10-CM

## 2023-01-23 DIAGNOSIS — F01B0 Vascular dementia, moderate, without behavioral disturbance, psychotic disturbance, mood disturbance, and anxiety: Secondary | ICD-10-CM

## 2023-01-23 DIAGNOSIS — E039 Hypothyroidism, unspecified: Secondary | ICD-10-CM | POA: Diagnosis not present

## 2023-01-23 DIAGNOSIS — E7800 Pure hypercholesterolemia, unspecified: Secondary | ICD-10-CM | POA: Diagnosis not present

## 2023-01-23 DIAGNOSIS — I10 Essential (primary) hypertension: Secondary | ICD-10-CM | POA: Diagnosis not present

## 2023-01-23 DIAGNOSIS — Z23 Encounter for immunization: Secondary | ICD-10-CM

## 2023-01-23 DIAGNOSIS — R600 Localized edema: Secondary | ICD-10-CM

## 2023-01-23 NOTE — Progress Notes (Signed)
Careteam: Patient Care Team: Sharon Seller, NP as PCP - General (Geriatric Medicine) Josph Macho, MD as Consulting Physician (Oncology) Janet Berlin, MD as Consulting Physician (Ophthalmology)  PLACE OF SERVICE:  [facility]  Advanced Directive information Does Patient Have a Medical Advance Directive?: Yes, Type of Advance Directive: Healthcare Power of [place];Living will, Does patient want to make changes to medical advance directive?: No - Patient declined (Patient has an updated copy at home with correct spelling of name)  No Known Allergies  Chief Complaint  Patient presents with   Medical Management of Chronic Issues    Routine visit. Discuss need for td/tdap and flu vaccine (today). [facility] verified      HPI: Patient is a 89 y.o. female presents for routine visit. Accompanied by daughter.  No current complaints by patient or concerns by daughter  HTN/HLD-controlled, on amlodipine/olmesartan and simvastatin. BLE edema improved.  20 lb weight loss since January 2024. Not eating as much candy or chips like she used to. Per daughter, patient is eating 3 meals per day, has a caregiver 4x/week, tries to make sure she's eating healthy. No trouble swallowing/chewing. Doesn't thinks she's drinking enough water Steady decline in memory. Lives alone. No recent falls. Using the cane.  Sacrum pain has improved spontaneously.   Review of Systems:  Review of Systems  Constitutional:  Positive for weight loss. Negative for chills, fever and malaise/fatigue.  Respiratory:  Negative for cough and shortness of breath.   Cardiovascular:  Positive for leg swelling. Negative for chest pain and palpitations.  Gastrointestinal:  Negative for abdominal pain, constipation, diarrhea, nausea and vomiting.  Genitourinary:  Negative for dysuria, frequency and urgency.  Musculoskeletal:  Negative for joint pain and myalgias.  Neurological:  Negative for dizziness, weakness and  headaches.  Psychiatric/Behavioral:  Positive for memory loss. Negative for depression. The patient is not nervous/anxious and does not have insomnia.     Past Medical History:  Diagnosis Date   Arthritis    Glaucoma    Hyperlipidemia    Hypertension    Osteoporosis    Thyroid disease    Past Surgical History:  Procedure Laterality Date   ABDOMINAL HYSTERECTOMY  1965   partial   CHOLECYSTECTOMY  1989   SKIN SURGERY  02/2014   Basel Cell removed from right side of face    Social History:   reports that she has never smoked. She has never used smokeless tobacco. She reports that she does not drink alcohol and does not use drugs.  Family History  Problem Relation Age of Onset   Cancer Mother    Cancer Father        lung   Heart disease Father     Medications: Patient's Medications  New Prescriptions   No medications on file  Previous Medications   AMLODIPINE (NORVASC) 5 MG TABLET    TAKE 1 TABLET BY MOUTH DAILY   CALCIUM CARBONATE-VIT D-MIN (CALCIUM 1200 PO)    Take 1 tablet by mouth daily.   CHOLECALCIFEROL (VITAMIN D) 2000 UNITS CAPS    Take 1 capsule (2,000 Units total) by mouth daily.   DONEPEZIL (ARICEPT) 10 MG TABLET    TAKE 1 TABLET BY MOUTH AT  BEDTIME   LEVOTHYROXINE (SYNTHROID) 25 MCG TABLET    TAKE 1 TABLET BY MOUTH ONCE DAILY BEFORE BREAKFAST ON  AN EMPTY STOMACH FOR  THYROID. Needs an appointment before anymore future refills.   MULTIPLE VITAMINS-MINERALS (SENIOR MULTIVITAMIN PLUS PO)  Take by mouth every morning.   OLMESARTAN (BENICAR) 20 MG TABLET    Take 1 tablet (20 mg total) by mouth daily. Needs an appointment before anymore future refills.   OMEGA-3 FATTY ACIDS (FISH OIL) 1000 MG CAPS    Take 1,000 capsules by mouth daily.   POLYETHYL GLYCOL-PROPYL GLYCOL (SYSTANE OP)    Apply 1 drop to eye as needed.   SIMVASTATIN (ZOCOR) 20 MG TABLET    Take one tablet by mouth once daily for cholesterol. Needs an appointment before anymore future refills.  Modified  Medications   No medications on file  Discontinued Medications   HYDROCODONE-ACETAMINOPHEN (NORCO/VICODIN) 5-325 MG TABLET    Take 1 tablet by mouth 2 (two) times daily as needed.    Physical Exam:  Vitals:   01/23/23 1358  BP: 122/68  Pulse: 71  Temp: 97.9 F (36.6 C)  TempSrc: Temporal  SpO2: 97%  Weight: 54.4 kg  Height: 5\' 1"  (1.549 m)   Body mass index is 22.67 kg/m. Wt Readings from Last 3 Encounters:  01/23/23 54.4 kg  04/15/22 63.5 kg  04/11/22 63.5 kg    Physical Exam Constitutional:      General: She is not in acute distress.    Appearance: Normal appearance.  Cardiovascular:     Rate and Rhythm: Normal rate and regular rhythm.     Pulses: Normal pulses.     Heart sounds: Normal heart sounds.  Pulmonary:     Effort: Pulmonary effort is normal.     Breath sounds: Normal breath sounds.  Abdominal:     General: Bowel sounds are normal.     Palpations: Abdomen is soft.  Musculoskeletal:     Right lower leg: Edema (very mild ankle) present.     Left lower leg: Edema (very mild ankle) present.  Skin:    General: Skin is warm and dry.  Neurological:     Mental Status: She is alert. Mental status is at baseline. She is disoriented.  Psychiatric:        Mood and Affect: Mood normal.        Behavior: Behavior normal.    Labs reviewed: Basic Metabolic Panel: Recent Labs    04/15/22 1410  NA 138  K 4.0  CL 103  CO2 24  GLUCOSE 99  BUN 25  CREATININE 0.92  CALCIUM 9.3   Liver Function Tests: Recent Labs    04/15/22 1410  AST 25  ALT 14  BILITOT 0.7  PROT 6.5   No results for input(s): "LIPASE", "AMYLASE" in the last 8760 hours. No results for input(s): "AMMONIA" in the last 8760 hours. CBC: Recent Labs    04/15/22 1410  WBC 11.2*  HGB 13.7  HCT 39.0  MCV 93.1  PLT 241   Lipid Panel: No results for input(s): "CHOL", "HDL", "LDLCALC", "TRIG", "CHOLHDL", "LDLDIRECT" in the last 8760 hours. TSH: No results for input(s): "TSH" in the  last 8760 hours. A1C: Lab Results  Component Value Date   HGBA1C 5.5 11/02/2020    Assessment/Plan 1. Acquired hypothyroidism -Continue levothyroxine - TSH  2. Essential hypertension, benign -Controlled, at goal, <140/90 -Encouraged dietary modifications/DASH diet and physical activity as tolerated - COMPLETE METABOLIC PANEL WITH GFR - CBC with Differential/Platelet  3. Lower extremity edema -Improved -Educated on leg elevation above level of heart and using compression hose (on in AM, off in PM) if worsening recurs  4. Stage 3a chronic kidney disease (HCC) -Encouraged proper hydration/nutrition -Avoid nephrotoxic medications - COMPLETE METABOLIC  PANEL WITH GFR  5. Moderate vascular dementia without behavioral disturbance, psychotic disturbance, mood disturbance, or anxiety (HCC) -Steady decline in memory per daughter, no changes in mood/behavior -Continue donepezil and supportive care  6. Pure hypercholesterolemia -Continue simvastatin - Lipid panel -Encouraged dietary modifications and physical activity as tolerated  7. Need for influenza vaccination - Flu Vaccine Trivalent High Dose (Fluad)  8. Weight loss -expect to progress with progression of dementia. Encouraged 3 meals per day and protein supplementation with smallest meal of the day -TSH  Return in about 6 months (around 07/23/2023) for routine follow up.  Rollen Sox, FNP-MSN Student -I personally was present during the history, physical exam and medical decision-making activities of this service and have verified that the service and findings are accurately documented in the student's note Abbey Chatters, NP

## 2023-01-23 NOTE — Patient Instructions (Signed)
encouraged to liberalize diet. To have protein supplement in addition to smallest meal of the day.

## 2023-01-24 LAB — LIPID PANEL
Cholesterol: 150 mg/dL (ref <200)
HDL: 67 mg/dL (ref >=50)
LDL Cholesterol (Calc): 69 mg/dL
Non-HDL Cholesterol (Calc): 83 mg/dL (ref <130)
Total CHOL/HDL Ratio: 2.2 (calc) (ref <5.0)
Triglycerides: 60 mg/dL (ref <150)

## 2023-01-24 LAB — COMPLETE METABOLIC PANEL WITH GFR
AG Ratio: 2 (calc) (ref 1.0–2.5)
ALT: 10 U/L (ref 6–29)
AST: 17 U/L (ref 10–35)
Albumin: 4 g/dL (ref 3.6–5.1)
Alkaline phosphatase (APISO): 59 U/L (ref 37–153)
BUN/Creatinine Ratio: 24 (calc) — ABNORMAL HIGH (ref 6–22)
BUN: 25 mg/dL (ref 7–25)
CO2: 29 mmol/L (ref 20–32)
Calcium: 9.5 mg/dL (ref 8.6–10.4)
Chloride: 104 mmol/L (ref 98–110)
Creat: 1.05 mg/dL — ABNORMAL HIGH (ref 0.60–0.95)
Globulin: 2 g/dL (ref 1.9–3.7)
Glucose, Bld: 109 mg/dL (ref 65–139)
Potassium: 4.3 mmol/L (ref 3.5–5.3)
Sodium: 140 mmol/L (ref 135–146)
Total Bilirubin: 0.8 mg/dL (ref 0.2–1.2)
Total Protein: 6 g/dL — ABNORMAL LOW (ref 6.1–8.1)
eGFR: 49 mL/min/{1.73_m2} — ABNORMAL LOW (ref >=60)

## 2023-01-24 LAB — CBC WITH DIFFERENTIAL/PLATELET
Absolute Lymphocytes: 5078 {cells}/uL — ABNORMAL HIGH (ref 850–3900)
Absolute Monocytes: 876 {cells}/uL (ref 200–950)
Basophils Absolute: 52 {cells}/uL (ref 0–200)
Basophils Relative: 0.5 %
Eosinophils Absolute: 124 {cells}/uL (ref 15–500)
Eosinophils Relative: 1.2 %
HCT: 40.8 % (ref 35.0–45.0)
Hemoglobin: 13.4 g/dL (ref 11.7–15.5)
MCH: 32.8 pg (ref 27.0–33.0)
MCHC: 32.8 g/dL (ref 32.0–36.0)
MCV: 99.8 fL (ref 80.0–100.0)
MPV: 10.1 fL (ref 7.5–12.5)
Monocytes Relative: 8.5 %
Neutro Abs: 4172 {cells}/uL (ref 1500–7800)
Neutrophils Relative %: 40.5 %
Platelets: 205 10*3/uL (ref 140–400)
RBC: 4.09 10*6/uL (ref 3.80–5.10)
RDW: 11.9 % (ref 11.0–15.0)
Total Lymphocyte: 49.3 %
WBC: 10.3 10*3/uL (ref 3.8–10.8)

## 2023-01-24 LAB — TSH: TSH: 3.22 m[IU]/L (ref 0.40–4.50)

## 2023-02-09 ENCOUNTER — Other Ambulatory Visit: Payer: Self-pay | Admitting: Nurse Practitioner

## 2023-02-10 ENCOUNTER — Other Ambulatory Visit: Payer: Self-pay | Admitting: Nurse Practitioner

## 2023-03-30 ENCOUNTER — Encounter: Payer: Self-pay | Admitting: Nurse Practitioner

## 2023-03-30 ENCOUNTER — Ambulatory Visit: Payer: Medicare Other | Admitting: Nurse Practitioner

## 2023-03-30 DIAGNOSIS — Z0000 Encounter for general adult medical examination without abnormal findings: Secondary | ICD-10-CM | POA: Diagnosis not present

## 2023-03-30 NOTE — Progress Notes (Signed)
   This service is provided via telemedicine  No vital signs collected/recorded due to the encounter was a telemedicine visit.   Location of patient (ex: home, work):  Home  Patient consents to a telephone visit: Yes  Location of the provider (ex: office, home):  [facility], Office   Name of any referring provider:  N/A  Names of all persons participating in the telemedicine service and their role in the encounter:  Ronald Pippins, CMA, Patient, and   Time spent on call:  9 min with medical assistant  Janyth Contes Janene Harvey, NP

## 2023-03-30 NOTE — Patient Instructions (Signed)
  Ms. Meaney , Thank you for taking time to come for your Medicare Wellness Visit. I appreciate your ongoing commitment to your health goals. Please review the following plan we discussed and let me know if I can assist you in the future.   This is a list of the screening recommended for you and due dates:  Health Maintenance  Topic Date Due   Zoster (Shingles) Vaccine (1 of 2) Never done   COVID-19 Vaccine (6 - 2024-25 season) 03/11/2023   DTaP/Tdap/Td vaccine (2 - Td or Tdap) 01/23/2024*   Medicare Annual Wellness Visit  03/29/2024   Pneumonia Vaccine  Completed   Flu Shot  Completed   DEXA scan (bone density measurement)  Completed   HPV Vaccine  Aged Out  *Topic was postponed. The date shown is not the original due date.

## 2023-03-30 NOTE — Progress Notes (Signed)
 Subjective:   Krystal [name] is a 89 y.o. female who presents for Medicare Annual (Subsequent) preventive examination.  Visit Complete: Virtual I connected with  Krystal [name] on 03/30/23 by a video and audio enabled telemedicine application and verified that I am speaking with the correct person using two identifiers.  Patient Location: Home  Provider Location: Office/Clinic  I discussed the limitations of evaluation and management by telemedicine. The patient expressed understanding and agreed to proceed.  Vital Signs: Because this visit was a virtual/telehealth visit, some criteria may be missing or patient reported. Any vitals not documented were not able to be obtained and vitals that have been documented are patient reported.    Cardiac Risk Factors include: advanced age (>15men, >28 women);dyslipidemia;hypertension;sedentary lifestyle     Objective:    There were no vitals filed for this visit. There is no height or weight on file to calculate BMI.     03/30/2023   11:00 AM 01/23/2023    1:59 PM 04/06/2022    2:00 PM 03/26/2022   11:02 AM 03/25/2022    3:18 PM 10/21/2021   10:09 AM 06/10/2021    1:55 PM  Advanced Directives  Does Patient Have a Medical Advance Directive? Yes Yes No Yes Yes Yes Yes  Type of Estate Agent of [place];Living will Healthcare Power of [place];Living will  Living will;Healthcare Power of Attorney Living will;Healthcare Power of [employer] Power of [place];Living will Healthcare Power of [place];Living will  Does patient want to make changes to medical advance directive? No - Patient declined No - Patient declined No - Patient declined No - Patient declined No - Patient declined No - Patient declined No - Patient declined  Copy of Healthcare Power of Attorney in Chart? Yes - validated most recent copy scanned in chart (See row information) Yes - validated most recent copy scanned in chart (See row  information)  Yes - validated most recent copy scanned in chart (See row information) Yes - validated most recent copy scanned in chart (See row information) Yes - validated most recent copy scanned in chart (See row information) Yes - validated most recent copy scanned in chart (See row information)  Would patient like information on creating a medical advance directive?   No - Patient declined        Current Medications (verified) Outpatient Encounter Medications as of 03/30/2023  Medication Sig   amLODipine  (NORVASC ) 5 MG tablet TAKE 1 TABLET BY MOUTH DAILY   Calcium Carbonate-Vit D-Min (CALCIUM 1200 PO) Take 1 tablet by mouth daily.   Cholecalciferol (VITAMIN D ) 2000 UNITS CAPS Take 1 capsule (2,000 Units total) by mouth daily.   donepezil  (ARICEPT ) 10 MG tablet TAKE 1 TABLET BY MOUTH AT  BEDTIME   levothyroxine  (SYNTHROID ) 25 MCG tablet TAKE 1 TABLET BY MOUTH ONCE DAILY BEFORE BREAKFAST ON  AN EMPTY STOMACH FOR  THYROID . Needs an appointment before anymore future refills.   Multiple Vitamins-Minerals (SENIOR MULTIVITAMIN PLUS PO) Take by mouth every morning.   olmesartan  (BENICAR ) 20 MG tablet TAKE 1 TABLET BY MOUTH DAILY   Omega-3 Fatty Acids (FISH OIL) 1000 MG CAPS Take 1,000 capsules by mouth daily.   simvastatin  (ZOCOR ) 20 MG tablet TAKE 1 TABLET BY MOUTH DAILY FOR CHOLESTEROL   Polyethyl Glycol-Propyl Glycol (SYSTANE OP) Apply 1 drop to eye as needed. (Patient not taking: Reported on 03/30/2023)   No facility-administered encounter medications on file as of 03/30/2023.    Allergies (verified) Patient has no known  allergies.   History: Past Medical History:  Diagnosis Date   Arthritis    Glaucoma    Hyperlipidemia    Hypertension    Osteoporosis    Thyroid  disease    Past Surgical History:  Procedure Laterality Date   ABDOMINAL HYSTERECTOMY  1965   partial   CHOLECYSTECTOMY  1989   SKIN SURGERY  02/2014   Basel Cell removed from right side of face    Family History   Problem Relation Age of Onset   Cancer Mother    Cancer Father        lung   Heart disease Father    Social History   Socioeconomic History   Marital status: Widowed    Spouse name: Not on file   Number of children: Not on file   Years of education: Not on file   Highest education level: Not on file  Occupational History   Not on file  Tobacco Use   Smoking status: Never   Smokeless tobacco: Never   Tobacco comments:    never used tobacco  Vaping Use   Vaping status: Never Used  Substance and Sexual Activity   Alcohol  use: No    Alcohol /week: 0.0 standard drinks of alcohol    Drug use: No   Sexual activity: Not Currently  Other Topics Concern   Not on file  Social History Narrative   Caffeine: Coffee   Widow, married in 1947   Lives in house, 1 stories, one person, one dog   Current/past profession: Diplomatic Services Operational Officer   Exercise: No   Living Will: Yes   DNR: No, would not like to discuss              Social Drivers of Corporate Investment Banker Strain: Low Risk  (02/01/2018)   Overall Financial Resource Strain (CARDIA)    Difficulty of Paying Living Expenses: Not hard at all  Food Insecurity: No Food Insecurity (02/01/2018)   Hunger Vital Sign    Worried About Running Out of Food in the Last Year: Never true    Ran Out of Food in the Last Year: Never true  Transportation Needs: No Transportation Needs (02/01/2018)   PRAPARE - Administrator, Civil Service (Medical): No    Lack of Transportation (Non-Medical): No  Physical Activity: Inactive (02/01/2018)   Exercise Vital Sign    Days of Exercise per Week: 0 days    Minutes of Exercise per Session: 0 min  Stress: No Stress Concern Present (02/01/2018)   [employer] of Occupational Health - Occupational Stress Questionnaire    Feeling of Stress : Not at all  Social Connections: Moderately Isolated (02/01/2018)   Social Connection and Isolation Panel [NHANES]    Frequency of Communication with  Friends and Family: More than three times a week    Frequency of Social Gatherings with Friends and Family: More than three times a week    Attends Religious Services: Never    Database Administrator or Organizations: No    Attends Banker Meetings: Never    Marital Status: Widowed    Tobacco Counseling Counseling given: Not Answered Tobacco comments: never used tobacco   Clinical Intake:  Pre-visit preparation completed: Yes  Pain : No/denies pain     BMI - recorded: 22 Nutritional Status: BMI of 19-24  Normal  How often do you need to have someone help you when you read instructions, pamphlets, or other written materials from your doctor or pharmacy?:  4 - Often         Activities of Daily Living    03/30/2023   11:19 AM  In your present state of health, do you have any difficulty performing the following activities:  Hearing? 0  Vision? 1  Difficulty concentrating or making decisions? 1  Walking or climbing stairs? 1  Dressing or bathing? 0  Doing errands, shopping? 1  Preparing Food and eating ? Y  Comment does not prepare food  Using the Toilet? N  In the past six months, have you accidently leaked urine? N  Do you have problems with loss of bowel control? N  Managing your Medications? Y  Managing your Finances? Y  Housekeeping or managing your Housekeeping? Y    Patient Care Team: Caro Harlene POUR, NP as PCP - General (Geriatric Medicine) Timmy Maude SAUNDERS, MD as Consulting Physician (Oncology) Patrcia Sharper, MD as Consulting Physician (Ophthalmology)  Indicate any recent Medical Services you may have received from other than [facility] providers in the past year (date may be approximate).     Assessment:   This is a routine wellness examination for Krystal [name].  Hearing/Vision screen Hearing Screening - Comments:: 1/24 last hearing exam no issues Vision Screening - Comments:: 1 24 eye exam no issues   Goals Addressed   None    Depression  Screen    03/30/2023   10:58 AM 01/23/2023    1:57 PM 03/26/2022   10:59 AM 06/10/2021    2:02 PM 02/25/2021   10:34 AM 02/12/2021    8:55 AM 04/30/2020   12:06 PM  PHQ 2/9 Scores  PHQ - 2 Score 0 0 0 0 0 0 0    Fall Risk    03/30/2023   10:58 AM 01/23/2023    1:57 PM 03/26/2022   10:59 AM 01/14/2022    2:55 PM 10/21/2021   10:08 AM  Fall Risk   Falls in the past year? 0 1 1 1 1   Number falls in past yr: 0 0 0 1 0  Injury with Fall? 0 0 0 1 0  Risk for fall due to :  No Fall Risks No Fall Risks History of fall(s) No Fall Risks  Follow up  Falls evaluation completed Falls evaluation completed Falls evaluation completed Falls evaluation completed    MEDICARE RISK AT HOME: Medicare Risk at Home Any stairs in or around the home?: No Home free of loose throw rugs in walkways, pet beds, electrical cords, etc?: Yes Adequate lighting in your home to reduce risk of falls?: Yes Life alert?: No Use of a cane, walker or w/c?: Yes Grab bars in the bathroom?: No Shower chair or bench in shower?: Yes Elevated toilet seat or a handicapped toilet?: No  TIMED UP AND GO:  Was the test performed?  No    Cognitive Function:    04/05/2018   12:39 PM 02/01/2018   11:09 AM 09/26/2016    3:30 PM 09/10/2015    2:00 PM 07/31/2014    9:14 AM  MMSE - Mini Mental State Exam  Orientation to time 5 5 5 5 4   Orientation to Place 4 4 4 4 4   Registration 3 3 3 3 3   Attention/ Calculation 5 5 5 5 5   Recall 3 3 2 3 1   Language- name 2 objects 2 2 2 2 2   Language- repeat 0 1 1 1 1   Language- follow 3 step command 3 3 3 3 3   Language- read & follow direction  1 1 1 1 1   Write a sentence 1 1 1 1 1   Copy design 1 1 1 1  0  Total score 28 29 28 29 25         03/30/2023   11:04 AM 03/26/2022   11:02 AM 02/12/2021    8:57 AM 02/08/2020    1:00 PM  6CIT Screen  What Year? 4 points 4 points 0 points 0 points  What month? 0 points 0 points 0 points 0 points  What time? 0 points 3 points 0 points 0 points  Count  back from 20 0 points 0 points 0 points 0 points  Months in reverse 4 points 4 points 4 points 0 points  Repeat phrase 10 points 10 points 10 points 10 points  Total Score 18 points 21 points 14 points 10 points    Immunizations Immunization History  Administered Date(s) Administered   Fluad Quad(high Dose 65+) 02/04/2019, 12/25/2020, 02/03/2022   Fluad Trivalent(High Dose 65+) 01/23/2023   Influenza,inj,Quad PF,6+ Mos 02/02/2015   Influenza-Unspecified 01/02/2014, 01/11/2016, 12/23/2019   Moderna SARS-COV2 Booster Vaccination 01/20/2020   PFIZER(Purple Top)SARS-COV-2 Vaccination 04/03/2019, 05/14/2019, 01/20/2020   Pfizer Covid-19 Vaccine Bivalent Booster 89yrs & up 12/25/2020   Pfizer(Comirnaty )Fall Seasonal Vaccine 12 years and older 03/03/2022, 01/14/2023   Pneumococcal Conjugate-13 04/24/2014   Pneumococcal Polysaccharide-23 03/15/2013   Respiratory Syncytial Virus Vaccine ,Recomb Aduvanted(Arexvy ) 03/03/2022   Tdap 03/24/2012    TDAP status: Up to date  Flu Vaccine status: Up to date  Pneumococcal vaccine status: Up to date  Covid-19 vaccine status: Information provided on how to obtain vaccines.   Qualifies for Shingles Vaccine? Yes   Zostavax completed No   Shingrix Completed?: No.    Education has been provided regarding the importance of this vaccine. Patient has been advised to call insurance company to determine out of pocket expense if they have not yet received this vaccine. Advised may also receive vaccine at local pharmacy or Health Dept. Verbalized acceptance and understanding.  Screening Tests Health Maintenance  Topic Date Due   Zoster Vaccines- Shingrix (1 of 2) Never done   COVID-19 Vaccine (6 - 2024-25 season) 03/11/2023   DTaP/Tdap/Td (2 - Td or Tdap) 01/23/2024 (Originally 03/24/2022)   Medicare Annual Wellness (AWV)  03/29/2024   Pneumonia Vaccine 40+ Years old  Completed   INFLUENZA VACCINE  Completed   DEXA SCAN  Completed   HPV VACCINES  Aged Out     Health Maintenance  Health Maintenance Due  Topic Date Due   Zoster Vaccines- Shingrix (1 of 2) Never done   COVID-19 Vaccine (6 - 2024-25 season) 03/11/2023    Colorectal cancer screening: No longer required.   Mammogram status: No longer required due to age. Aged out of bone density  Lung Cancer Screening: (Low Dose CT Chest recommended if Age 67-80 years, 20 pack-year currently smoking OR have quit w/in 15years.) does not qualify.   Lung Cancer Screening Referral: na  Additional Screening:  Hepatitis C Screening: does not qualify  Vision Screening: Recommended annual ophthalmology exams for early detection of glaucoma and other disorders of the eye. Is the patient up to date with their annual eye exam?  No  Who is the provider or what is the name of the office in which the patient attends annual eye exams? [facility] If pt is not established with a provider, would they like to be referred to a provider to establish care? No .   Dental Screening: Recommended annual dental exams for proper oral  hygiene   Community Resource Referral / Chronic Care Management: CRR required this visit?  No   CCM required this visit?  No     Plan:     I have personally reviewed and noted the following in the patient's chart:   Medical and social history Use of alcohol , tobacco or illicit drugs  Current medications and supplements including opioid prescriptions. Patient is not currently taking opioid prescriptions. Functional ability and status Nutritional status Physical activity Advanced directives List of other physicians Hospitalizations, surgeries, and ER visits in previous 12 months Vitals Screenings to include cognitive, depression, and falls Referrals and appointments  In addition, I have reviewed and discussed with patient certain preventive protocols, quality metrics, and best practice recommendations. A written personalized care plan for preventive services as well as  general preventive health recommendations were provided to patient.     Harlene MARLA An, NP   03/30/2023   After Visit Summary: (MyChart) Due to this being a telephonic visit, the after visit summary with patients personalized plan was offered to patient via MyChart

## 2023-05-05 ENCOUNTER — Other Ambulatory Visit: Payer: Self-pay | Admitting: Nurse Practitioner

## 2023-07-21 ENCOUNTER — Other Ambulatory Visit: Payer: Self-pay | Admitting: Nurse Practitioner

## 2023-07-24 ENCOUNTER — Ambulatory Visit (INDEPENDENT_AMBULATORY_CARE_PROVIDER_SITE_OTHER): Payer: Medicare Other | Admitting: Nurse Practitioner

## 2023-07-24 VITALS — BP 118/66 | HR 77 | Temp 97.6°F | Resp 16 | Ht 61.0 in | Wt 124.6 lb

## 2023-07-24 DIAGNOSIS — E7800 Pure hypercholesterolemia, unspecified: Secondary | ICD-10-CM | POA: Diagnosis not present

## 2023-07-24 DIAGNOSIS — N1831 Chronic kidney disease, stage 3a: Secondary | ICD-10-CM | POA: Diagnosis not present

## 2023-07-24 DIAGNOSIS — I10 Essential (primary) hypertension: Secondary | ICD-10-CM

## 2023-07-24 DIAGNOSIS — R600 Localized edema: Secondary | ICD-10-CM

## 2023-07-24 DIAGNOSIS — F01B0 Vascular dementia, moderate, without behavioral disturbance, psychotic disturbance, mood disturbance, and anxiety: Secondary | ICD-10-CM | POA: Diagnosis not present

## 2023-07-24 DIAGNOSIS — E039 Hypothyroidism, unspecified: Secondary | ICD-10-CM | POA: Diagnosis not present

## 2023-07-24 NOTE — Progress Notes (Signed)
 "   Careteam: Patient Care Team: Caro Harlene POUR, NP as PCP - General (Geriatric Medicine) Timmy Maude SAUNDERS, MD as Consulting Physician (Oncology) Patrcia Sharper, MD as Consulting Physician (Ophthalmology)  PLACE OF SERVICE:  [facility]  Advanced Directive information Does Patient Have a Medical Advance Directive?: Yes, Type of Advance Directive: Healthcare Power of [place];Living will, Does patient want to make changes to medical advance directive?: No - Patient declined  No Known Allergies  Chief Complaint  Patient presents with   Medical Management of Chronic Issues    6 Month follow up.    HPI:  Discussed the use of AI scribe software for clinical note transcription with the patient, who gave verbal consent to proceed.  History of Present Illness Krystal [name] is a 89 year old female who presents for a six-month follow-up visit. She is accompanied by her caretaker.  She has been feeling generally well, although she feels unwell due to a lot of work and activities. Her mood was affected when her caretaker went on vacation, but it has improved since her return.  She experienced a fall in January, but the details are unclear. She does not have a life alert system but now has a camera installed in her house for monitoring. No recent falls have occurred since then.  No issues with constipation, diarrhea, pain, or difficulty with chewing and swallowing. No problems with making it to the bathroom and no accidents reported.  She sleeps well at night and does not report any anxiety or depression. She has no issues taking her medications.    Review of Systems:  Review of Systems  Unable to perform ROS: Dementia    Past Medical History:  Diagnosis Date   Arthritis    Glaucoma    Hyperlipidemia    Hypertension    Osteoporosis    Thyroid  disease    Past Surgical History:  Procedure Laterality Date   ABDOMINAL HYSTERECTOMY  1965   partial   CHOLECYSTECTOMY   1989   SKIN SURGERY  02/2014   Basel Cell removed from right side of face    Social History:   reports that she has never smoked. She has never used smokeless tobacco. She reports that she does not drink alcohol  and does not use drugs.  Family History  Problem Relation Age of Onset   Cancer Mother    Cancer Father        lung   Heart disease Father     Medications: Patient's Medications  New Prescriptions   No medications on file  Previous Medications   AMLODIPINE  (NORVASC ) 5 MG TABLET    TAKE 1 TABLET BY MOUTH DAILY   CALCIUM CARBONATE-VIT D-MIN (CALCIUM 1200 PO)    Take 1 tablet by mouth daily.   CHOLECALCIFEROL (VITAMIN D ) 2000 UNITS CAPS    Take 1 capsule (2,000 Units total) by mouth daily.   DONEPEZIL  (ARICEPT ) 10 MG TABLET    TAKE 1 TABLET BY MOUTH AT  BEDTIME   LEVOTHYROXINE  (SYNTHROID ) 25 MCG TABLET    TAKE 1 TABLET BY MOUTH ONCE DAILY BEFORE BREAKFAST ON  AN EMPTY STOMACH FOR  THYROID . Needs an appointment before anymore future refills.   MULTIPLE VITAMINS-MINERALS (SENIOR MULTIVITAMIN PLUS PO)    Take by mouth every morning.   OLMESARTAN  (BENICAR ) 20 MG TABLET    TAKE 1 TABLET BY MOUTH DAILY   OMEGA-3 FATTY ACIDS (FISH OIL) 1000 MG CAPS    Take 1,000 capsules by mouth daily.  POLYETHYL GLYCOL-PROPYL GLYCOL (SYSTANE OP)    Apply 1 drop to eye as needed.   SIMVASTATIN  (ZOCOR ) 20 MG TABLET    TAKE 1 TABLET BY MOUTH DAILY FOR CHOLESTEROL  Modified Medications   No medications on file  Discontinued Medications   No medications on file    Physical Exam:  Vitals:   07/24/23 1354  BP: 118/66  Pulse: 77  Resp: 16  Temp: 97.6 F (36.4 C)  SpO2: 99%  Weight: 124 lb 9.6 oz (56.5 kg)  Height: 5' 1 (1.549 m)   Body mass index is 23.54 kg/m. Wt Readings from Last 3 Encounters:  07/24/23 124 lb 9.6 oz (56.5 kg)  01/23/23 120 lb (54.4 kg)  04/15/22 140 lb (63.5 kg)    Physical Exam Constitutional:      General: She is not in acute distress.    Appearance: She is  well-developed. She is not diaphoretic.  HENT:     Head: Normocephalic and atraumatic.     Mouth/Throat:     Pharynx: No oropharyngeal exudate.  Eyes:     Conjunctiva/sclera: Conjunctivae normal.     Pupils: Pupils are equal, round, and reactive to light.  Cardiovascular:     Rate and Rhythm: Normal rate and regular rhythm.     Heart sounds: Normal heart sounds.  Pulmonary:     Effort: Pulmonary effort is normal.     Breath sounds: Normal breath sounds.  Abdominal:     General: Bowel sounds are normal.     Palpations: Abdomen is soft.  Musculoskeletal:     Cervical back: Normal range of motion and neck supple.     Right lower leg: No edema.     Left lower leg: No edema.  Skin:    General: Skin is warm and dry.  Neurological:     Mental Status: She is alert. Mental status is at baseline.     Motor: No weakness.     Gait: Gait normal.  Psychiatric:        Mood and Affect: Mood normal.     Labs reviewed: Basic Metabolic Panel: Recent Labs    01/23/23 1510 07/24/23 1414  NA 140 137  K 4.3 4.4  CL 104 103  CO2 29 26  GLUCOSE 109 90  BUN 25 22  CREATININE 1.05* 1.03*  CALCIUM 9.5 9.6  TSH 3.22  --    Liver Function Tests: Recent Labs    01/23/23 1510 07/24/23 1414  AST 17 19  ALT 10 10  BILITOT 0.8 0.8  PROT 6.0* 6.3   No results for input(s): LIPASE, AMYLASE in the last 8760 hours. No results for input(s): AMMONIA in the last 8760 hours. CBC: Recent Labs    01/23/23 1510 07/24/23 1414  WBC 10.3 15.0*  NEUTROABS 4,172 7,260  HGB 13.4 13.2  HCT 40.8 39.3  MCV 99.8 97.0  PLT 205 152   Lipid Panel: Recent Labs    01/23/23 1510  CHOL 150  HDL 67  LDLCALC 69  TRIG 60  CHOLHDL 2.2   TSH: Recent Labs    01/23/23 1510  TSH 3.22   A1C: Lab Results  Component Value Date   HGBA1C 5.5 11/02/2020     Assessment/Plan Acquired hypothyroidism Assessment & Plan: Continues on synthroid  25 mcg    Essential hypertension,  benign Assessment & Plan: Blood pressure well controlled, goal bp <140/90 Continue current medications and dietary modifications follow metabolic panel  Orders: -     CBC with Differential/Platelet -  COMPLETE METABOLIC PANEL WITHOUT GFR  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 Continues on aricept     Stage 3a chronic kidney disease (HCC) Assessment & Plan: Chronic and stable Encourage proper hydration Follow metabolic panel Avoid nephrotoxic meds (NSAIDS)   Pure hypercholesterolemia Assessment & Plan: Continues on simvastatin     Lower extremity edema Assessment & Plan: Well controlled at this time.  -encouraged to elevate legs above level of heart as tolerates, low sodium diet, compression hose as tolerates (on in am, off in pm) for worsening swelling legs.        Return in about 6 months (around 01/24/2024) for routine follow up, labs at time of visit.  Drae Mitzel K. Caro BODILY [facility] [phone]  "

## 2023-07-25 LAB — COMPLETE METABOLIC PANEL WITHOUT GFR
AG Ratio: 1.7 (calc) (ref 1.0–2.5)
ALT: 10 U/L (ref 6–29)
AST: 19 U/L (ref 10–35)
Albumin: 4 g/dL (ref 3.6–5.1)
Alkaline phosphatase (APISO): 63 U/L (ref 37–153)
BUN/Creatinine Ratio: 21 (calc) (ref 6–22)
BUN: 22 mg/dL (ref 7–25)
CO2: 26 mmol/L (ref 20–32)
Calcium: 9.6 mg/dL (ref 8.6–10.4)
Chloride: 103 mmol/L (ref 98–110)
Creat: 1.03 mg/dL — ABNORMAL HIGH (ref 0.60–0.95)
Globulin: 2.3 g/dL (ref 1.9–3.7)
Glucose, Bld: 90 mg/dL (ref 65–139)
Potassium: 4.4 mmol/L (ref 3.5–5.3)
Sodium: 137 mmol/L (ref 135–146)
Total Bilirubin: 0.8 mg/dL (ref 0.2–1.2)
Total Protein: 6.3 g/dL (ref 6.1–8.1)

## 2023-07-25 LAB — CBC WITH DIFFERENTIAL/PLATELET
Absolute Lymphocytes: 6285 {cells}/uL — ABNORMAL HIGH (ref 850–3900)
Absolute Monocytes: 1215 {cells}/uL — ABNORMAL HIGH (ref 200–950)
Basophils Absolute: 45 {cells}/uL (ref 0–200)
Basophils Relative: 0.3 %
Eosinophils Absolute: 195 {cells}/uL (ref 15–500)
Eosinophils Relative: 1.3 %
HCT: 39.3 % (ref 35.0–45.0)
Hemoglobin: 13.2 g/dL (ref 11.7–15.5)
MCH: 32.6 pg (ref 27.0–33.0)
MCHC: 33.6 g/dL (ref 32.0–36.0)
MCV: 97 fL (ref 80.0–100.0)
MPV: 11.1 fL (ref 7.5–12.5)
Monocytes Relative: 8.1 %
Neutro Abs: 7260 {cells}/uL (ref 1500–7800)
Neutrophils Relative %: 48.4 %
Platelets: 152 10*3/uL (ref 140–400)
RBC: 4.05 10*6/uL (ref 3.80–5.10)
RDW: 11.9 % (ref 11.0–15.0)
Total Lymphocyte: 41.9 %
WBC: 15 10*3/uL — ABNORMAL HIGH (ref 3.8–10.8)

## 2023-07-27 ENCOUNTER — Encounter: Payer: Self-pay | Admitting: Nurse Practitioner

## 2023-07-27 ENCOUNTER — Other Ambulatory Visit: Payer: Self-pay

## 2023-07-27 DIAGNOSIS — D72829 Elevated white blood cell count, unspecified: Secondary | ICD-10-CM

## 2023-07-28 DIAGNOSIS — N1831 Chronic kidney disease, stage 3a: Secondary | ICD-10-CM | POA: Insufficient documentation

## 2023-07-28 DIAGNOSIS — F01B0 Vascular dementia, moderate, without behavioral disturbance, psychotic disturbance, mood disturbance, and anxiety: Secondary | ICD-10-CM | POA: Insufficient documentation

## 2023-07-28 DIAGNOSIS — R600 Localized edema: Secondary | ICD-10-CM | POA: Insufficient documentation

## 2023-07-28 NOTE — Assessment & Plan Note (Signed)
 Stable, no acute changes in cognitive or functional status, continue supportive care with family. Continues on aricept 

## 2023-07-28 NOTE — Assessment & Plan Note (Signed)
 Continues on synthroid  25 mcg

## 2023-07-28 NOTE — Assessment & Plan Note (Signed)
Continues on simvastatin

## 2023-07-28 NOTE — Assessment & Plan Note (Signed)
 Blood pressure well controlled, goal bp <140/90 Continue current medications and dietary modifications follow metabolic panel

## 2023-07-28 NOTE — Assessment & Plan Note (Signed)
 Chronic and stable Encourage proper hydration Follow metabolic panel Avoid nephrotoxic meds (NSAIDS)

## 2023-07-28 NOTE — Assessment & Plan Note (Signed)
 Well controlled at this time.  -encouraged to elevate legs above level of heart as tolerates, low sodium diet, compression hose as tolerates (on in am, off in pm) for worsening swelling legs.

## 2023-07-29 ENCOUNTER — Ambulatory Visit: Payer: Self-pay

## 2023-07-29 NOTE — Telephone Encounter (Signed)
 Chief Complaint: Bilateral eye drainage/cough Symptoms: green eye drainage, red eyes, cough, lack of appetite Frequency: since Sunday  Pertinent Negatives: Patient denies fever Disposition: [] ED /[] Urgent Care (no appt availability in office) / [x] Appointment(In office/virtual)/ []  [facility]/ [] Home Care/ [] Refused Recommended Disposition /[] [facility]/ []  Follow-up with PCP Additional Notes: Patient's daughter called in to report patient is having bilateral eye drainage that is green, primarily worse upon waking. Patient's eyes are both red, with red eyelids. Patient also has a cough and lack of appetite. Patient appt made for tomorrow for evaluation. Daughter is asking if labs that were due on Friday can be performed at this appt also. This RN advised that this request will be noted, but unsure if this can be performed or not.    Copied from CRM [id number]. Topic: Clinical - Red Word Triage >> Jul 29, 2023  2:32 PM Karole Pacer C wrote: Red Word that prompted transfer to Nurse Triage: Patient's daughter states patient has been having some geen discharge from both eyes since Sunday. Patiet's daughter hasn't been eating very well either. Reason for Disposition  Eyelid is red and painful (or tender to touch)  Answer Assessment - Initial Assessment Questions 1. EYE DISCHARGE: "Is the discharge in one or both eyes?" "What color is it?" "How much is there?" "When did the discharge start?"      Green - started on Sunday 2. REDNESS OF SCLERA: "Is the redness in one or both eyes?" "When did the redness start?"      Yes red 3. EYELIDS: "Are the eyelids red or swollen?" If Yes, ask: "How much?"      Eyelids are red 4. VISION: "Is there any difficulty seeing clearly?"      Patient hasn't commented on it 5. PAIN: "Is there any pain? If Yes, ask: "How bad is it?" (Scale 1-10; or mild, moderate, severe)    - MILD (1-3): doesn't interfere with normal activities     - MODERATE (4-7):  interferes with normal activities or awakens from sleep    - SEVERE (8-10): excruciating pain, unable to do any normal activities       Patient hasn't reported any pain 6. CONTACT LENS: "Do you wear contacts?"     No 7. OTHER SYMPTOMS: "Do you have any other symptoms?" (e.g., fever, runny nose, cough)     Lack of appetite, cough  Protocols used: Eye - Pus or Discharge-A-AH

## 2023-07-29 NOTE — Telephone Encounter (Signed)
 Noted.

## 2023-07-30 ENCOUNTER — Other Ambulatory Visit (HOSPITAL_COMMUNITY): Payer: Self-pay

## 2023-07-30 ENCOUNTER — Encounter: Payer: Self-pay | Admitting: Orthopedic Surgery

## 2023-07-30 ENCOUNTER — Ambulatory Visit (INDEPENDENT_AMBULATORY_CARE_PROVIDER_SITE_OTHER): Admitting: Orthopedic Surgery

## 2023-07-30 VITALS — BP 130/60 | HR 83 | Temp 98.3°F | Resp 16 | Ht 61.0 in | Wt 121.6 lb

## 2023-07-30 DIAGNOSIS — B9689 Other specified bacterial agents as the cause of diseases classified elsewhere: Secondary | ICD-10-CM | POA: Diagnosis not present

## 2023-07-30 DIAGNOSIS — R0981 Nasal congestion: Secondary | ICD-10-CM | POA: Diagnosis not present

## 2023-07-30 DIAGNOSIS — H109 Unspecified conjunctivitis: Secondary | ICD-10-CM | POA: Diagnosis not present

## 2023-07-30 DIAGNOSIS — D72829 Elevated white blood cell count, unspecified: Secondary | ICD-10-CM | POA: Diagnosis not present

## 2023-07-30 LAB — CBC WITH DIFFERENTIAL/PLATELET
Absolute Lymphocytes: 4721 {cells}/uL — ABNORMAL HIGH (ref 850–3900)
Absolute Monocytes: 1835 {cells}/uL — ABNORMAL HIGH (ref 200–950)
Basophils Absolute: 44 {cells}/uL (ref 0–200)
Basophils Relative: 0.3 %
Eosinophils Absolute: 104 {cells}/uL (ref 15–500)
Eosinophils Relative: 0.7 %
HCT: 37.1 % (ref 35.0–45.0)
Hemoglobin: 12.7 g/dL (ref 11.7–15.5)
MCH: 32.7 pg (ref 27.0–33.0)
MCHC: 34.2 g/dL (ref 32.0–36.0)
MCV: 95.6 fL (ref 80.0–100.0)
MPV: 10.4 fL (ref 7.5–12.5)
Monocytes Relative: 12.4 %
Neutro Abs: 8096 {cells}/uL — ABNORMAL HIGH (ref 1500–7800)
Neutrophils Relative %: 54.7 %
Platelets: 293 10*3/uL (ref 140–400)
RBC: 3.88 10*6/uL (ref 3.80–5.10)
RDW: 12.1 % (ref 11.0–15.0)
Total Lymphocyte: 31.9 %
WBC: 14.8 10*3/uL — ABNORMAL HIGH (ref 3.8–10.8)

## 2023-07-30 MED ORDER — OFLOXACIN 0.3 % OP SOLN
2.0000 [drp] | Freq: Three times a day (TID) | OPHTHALMIC | 0 refills | Status: AC
  Filled 2023-07-30: qty 5, 5d supply, fill #0

## 2023-07-30 NOTE — Progress Notes (Signed)
 Careteam: Patient Care Team: Verma Gobble, NP as PCP - General (Geriatric Medicine) Ivor Mars, MD as Consulting Physician (Oncology) Rudine Cos, MD as Consulting Physician (Ophthalmology)  Seen by: Ulyses Gandy, AGNP-C  PLACE OF SERVICE:  [facility]  Advanced Directive information    No Known Allergies  Chief Complaint  Patient presents with   Eye Drainage    Patient complains of eye drainage, loss of appetite, and cough. Patient eye is red, and inflamed. Patient daughter noticed this Sunday 07/26/2023     HPI: Patient is a 89 y.o. female seen today for acute visit due to eye redness.   Discussed the use of AI scribe software for clinical note transcription with the patient, who gave verbal consent to proceed.  History of Present Illness    Daughter present during encounter.   Poor historian due to dementia. She first noticed her eyes becoming red with discharge on Sunday. Eyes with scratchy sensation but no itching or burning.   She experiences nasal congestion and a cough, with occasional yellow nasal discharge. No facial or dental pain is present. Her daughter mentions hoarseness, attributed to coughing. There is a discussion about the possibility of allergies contributing to her symptoms, as her daughter experiences similar issues.   Review of Systems:  Review of Systems  Unable to perform ROS: Dementia    Past Medical History:  Diagnosis Date   Arthritis    Glaucoma    Hyperlipidemia    Hypertension    Osteoporosis    Thyroid  disease    Past Surgical History:  Procedure Laterality Date   ABDOMINAL HYSTERECTOMY  1965   partial   CHOLECYSTECTOMY  1989   SKIN SURGERY  02/2014   Basel Cell removed from right side of face    Social History:   reports that she has never smoked. She has never used smokeless tobacco. She reports that she does not drink alcohol  and does not use drugs.  Family History  Problem Relation Age of Onset   Cancer  Mother    Cancer Father        lung   Heart disease Father     Medications: Patient's Medications  New Prescriptions   No medications on file  Previous Medications   AMLODIPINE  (NORVASC ) 5 MG TABLET    TAKE 1 TABLET BY MOUTH DAILY   CALCIUM CARBONATE-VIT D-MIN (CALCIUM 1200 PO)    Take 1 tablet by mouth daily.   CHOLECALCIFEROL (VITAMIN D ) 2000 UNITS CAPS    Take 1 capsule (2,000 Units total) by mouth daily.   DONEPEZIL  (ARICEPT ) 10 MG TABLET    TAKE 1 TABLET BY MOUTH AT  BEDTIME   LEVOTHYROXINE  (SYNTHROID ) 25 MCG TABLET    TAKE 1 TABLET BY MOUTH ONCE DAILY BEFORE BREAKFAST ON  AN EMPTY STOMACH FOR  THYROID . Needs an appointment before anymore future refills.   MULTIPLE VITAMINS-MINERALS (SENIOR MULTIVITAMIN PLUS PO)    Take by mouth every morning.   OLMESARTAN  (BENICAR ) 20 MG TABLET    TAKE 1 TABLET BY MOUTH DAILY   OMEGA-3 FATTY ACIDS (FISH OIL) 1000 MG CAPS    Take 1,000 capsules by mouth daily.   POLYETHYL GLYCOL-PROPYL GLYCOL (SYSTANE OP)    Apply 1 drop to eye as needed.   SIMVASTATIN  (ZOCOR ) 20 MG TABLET    TAKE 1 TABLET BY MOUTH DAILY FOR CHOLESTEROL  Modified Medications   No medications on file  Discontinued Medications   No medications on file  Physical Exam:  Vitals:   07/30/23 1414  BP: 130/60  Pulse: 83  Resp: 16  Temp: 98.3 F (36.8 C)  SpO2: 94%  Weight: 121 lb 9.6 oz (55.2 kg)  Height: 5\' 1"  (1.549 m)   Body mass index is 22.98 kg/m. Wt Readings from Last 3 Encounters:  07/30/23 121 lb 9.6 oz (55.2 kg)  07/24/23 124 lb 9.6 oz (56.5 kg)  01/23/23 120 lb (54.4 kg)    Physical Exam Vitals reviewed.  Constitutional:      General: She is not in acute distress. HENT:     Head: Normocephalic.     Right Ear: There is no impacted cerumen.     Left Ear: There is no impacted cerumen.     Nose: Rhinorrhea present.     Mouth/Throat:     Mouth: Mucous membranes are moist.     Pharynx: No posterior oropharyngeal erythema.  Eyes:     General:         Right eye: No discharge.        Left eye: No discharge.     Conjunctiva/sclera:     Right eye: Exudate present.     Left eye: Exudate present.     Comments: Purulent drainage to both eyes, sclera red  Cardiovascular:     Rate and Rhythm: Normal rate and regular rhythm.     Pulses: Normal pulses.     Heart sounds: Normal heart sounds.  Pulmonary:     Effort: Pulmonary effort is normal. No respiratory distress.     Breath sounds: Normal breath sounds. No wheezing or rales.  Abdominal:     General: Bowel sounds are normal. There is no distension.     Palpations: Abdomen is soft.     Tenderness: There is no abdominal tenderness.  Musculoskeletal:     Cervical back: Neck supple.     Right lower leg: No edema.     Left lower leg: No edema.  Skin:    General: Skin is warm.     Capillary Refill: Capillary refill takes less than 2 seconds.  Neurological:     General: No focal deficit present.     Mental Status: She is alert. Mental status is at baseline.  Psychiatric:        Mood and Affect: Mood normal.     Comments: Alert to self/person, follows commands     Labs reviewed: Basic Metabolic Panel: Recent Labs    01/23/23 1510 07/24/23 1414  NA 140 137  K 4.3 4.4  CL 104 103  CO2 29 26  GLUCOSE 109 90  BUN 25 22  CREATININE 1.05* 1.03*  CALCIUM 9.5 9.6  TSH 3.22  --    Liver Function Tests: Recent Labs    01/23/23 1510 07/24/23 1414  AST 17 19  ALT 10 10  BILITOT 0.8 0.8  PROT 6.0* 6.3   No results for input(s): "LIPASE", "AMYLASE" in the last 8760 hours. No results for input(s): "AMMONIA" in the last 8760 hours. CBC: Recent Labs    01/23/23 1510 07/24/23 1414  WBC 10.3 15.0*  NEUTROABS 4,172 7,260  HGB 13.4 13.2  HCT 40.8 39.3  MCV 99.8 97.0  PLT 205 152   Lipid Panel: Recent Labs    01/23/23 1510  CHOL 150  HDL 67  LDLCALC 69  TRIG 60  CHOLHDL 2.2   TSH: Recent Labs    01/23/23 1510  TSH 3.22   A1C: Lab Results  Component Value Date  HGBA1C 5.5 11/02/2020     Assessment/Plan 1. Bacterial conjunctivitis of both eyes (Primary) - involves both eyes, purulent drainage, sclera red - start ofloxacin  - wipe eyes with warm washcloth twice daily - may also use johnson's baby shampoo to wash eyes - ofloxacin  (OCUFLOX ) 0.3 % ophthalmic solution; Place 2 drops into both eyes 3 (three) times daily for 5 days.  Dispense: 5 mL; Refill: 0  2. Nasal congestion - clear nasal drainage, nasal turbinates normal - no facial or dental pain  - recommend zyrtec x 14 days  3. Leukocytosis, unspecified type - WBC 15.0 07/30/2023 - CBC with Differential/Platelet  Total time: 23. Greater than 50% of total time spent doing patient education regarding eye redness and nasal congestion including symptom/medication management.    Next appt: Visit date not found  Kameah Rawl Darral Ellis  [facility] [phone]

## 2023-07-30 NOTE — Patient Instructions (Addendum)
 Use prescription eye drop 3 times daily x 5 days  Try Zyrtec daily x 14 days for nasal congestion

## 2023-07-31 ENCOUNTER — Encounter: Payer: Self-pay | Admitting: Nurse Practitioner

## 2023-09-01 ENCOUNTER — Other Ambulatory Visit: Payer: Self-pay | Admitting: Nurse Practitioner

## 2023-09-24 DIAGNOSIS — H401134 Primary open-angle glaucoma, bilateral, indeterminate stage: Secondary | ICD-10-CM | POA: Diagnosis not present

## 2023-09-24 DIAGNOSIS — H52203 Unspecified astigmatism, bilateral: Secondary | ICD-10-CM | POA: Diagnosis not present

## 2023-09-24 DIAGNOSIS — H43813 Vitreous degeneration, bilateral: Secondary | ICD-10-CM | POA: Diagnosis not present

## 2023-09-24 DIAGNOSIS — H26491 Other secondary cataract, right eye: Secondary | ICD-10-CM | POA: Diagnosis not present

## 2023-09-24 DIAGNOSIS — H04123 Dry eye syndrome of bilateral lacrimal glands: Secondary | ICD-10-CM | POA: Diagnosis not present

## 2023-09-24 DIAGNOSIS — H18592 Other hereditary corneal dystrophies, left eye: Secondary | ICD-10-CM | POA: Diagnosis not present

## 2023-09-24 DIAGNOSIS — H524 Presbyopia: Secondary | ICD-10-CM | POA: Diagnosis not present

## 2023-10-10 ENCOUNTER — Other Ambulatory Visit: Payer: Self-pay | Admitting: Nurse Practitioner

## 2023-12-22 ENCOUNTER — Other Ambulatory Visit: Payer: Self-pay | Admitting: Nurse Practitioner

## 2023-12-30 ENCOUNTER — Other Ambulatory Visit (HOSPITAL_BASED_OUTPATIENT_CLINIC_OR_DEPARTMENT_OTHER): Payer: Self-pay

## 2023-12-30 DIAGNOSIS — Z23 Encounter for immunization: Secondary | ICD-10-CM | POA: Diagnosis not present

## 2023-12-30 MED ORDER — FLUZONE HIGH-DOSE 0.5 ML IM SUSY
0.5000 mL | PREFILLED_SYRINGE | Freq: Once | INTRAMUSCULAR | 0 refills | Status: AC
  Filled 2023-12-30: qty 0.5, 1d supply, fill #0

## 2023-12-30 MED ORDER — COMIRNATY 30 MCG/0.3ML IM SUSY
0.3000 mL | PREFILLED_SYRINGE | Freq: Once | INTRAMUSCULAR | 0 refills | Status: AC
  Filled 2023-12-30: qty 0.3, 1d supply, fill #0

## 2024-01-25 ENCOUNTER — Ambulatory Visit: Payer: Self-pay | Admitting: Nurse Practitioner

## 2024-01-25 ENCOUNTER — Encounter: Payer: Self-pay | Admitting: Nurse Practitioner

## 2024-01-25 VITALS — BP 128/64 | HR 73 | Temp 97.6°F | Ht 61.0 in | Wt 110.6 lb

## 2024-01-25 DIAGNOSIS — E039 Hypothyroidism, unspecified: Secondary | ICD-10-CM

## 2024-01-25 DIAGNOSIS — I10 Essential (primary) hypertension: Secondary | ICD-10-CM | POA: Diagnosis not present

## 2024-01-25 DIAGNOSIS — F01B0 Vascular dementia, moderate, without behavioral disturbance, psychotic disturbance, mood disturbance, and anxiety: Secondary | ICD-10-CM

## 2024-01-25 DIAGNOSIS — N1831 Chronic kidney disease, stage 3a: Secondary | ICD-10-CM | POA: Diagnosis not present

## 2024-01-25 DIAGNOSIS — R634 Abnormal weight loss: Secondary | ICD-10-CM

## 2024-01-25 DIAGNOSIS — E7800 Pure hypercholesterolemia, unspecified: Secondary | ICD-10-CM

## 2024-01-25 NOTE — Progress Notes (Signed)
 Careteam: Patient Care Team: Caro Harlene POUR, NP as PCP - General (Geriatric Medicine) Timmy Maude SAUNDERS, MD as Consulting Physician (Oncology) Patrcia Sharper, MD as Consulting Physician (Ophthalmology)  PLACE OF SERVICE:  [facility]  Advanced Directive information    No Known Allergies  Chief Complaint  Patient presents with   Medical Management of Chronic Issues    6 month routine follow up  no questions or concerns at this time  Declined all vaccines    HPI:  Discussed the use of AI scribe software for clinical note transcription with the patient, who gave verbal consent to proceed.  History of Present Illness Krystal [name] is a 89 year old female who presents with decreased appetite and weight loss who is here for routine follow up with her daughter.   She has experienced a decreased appetite and significant weight loss, losing approximately 11 pounds since May, from 121 pounds to 110 pounds. She typically consumes two meals a day but often skips her evening meal, even when food is available. She sometimes eats only half of what is provided. Her caregiver notes she has stopped eating ice cream and candy, which she previously enjoyed.  She has been sleeping more than usual, often going to bed as early as 3 or 4 in the afternoon. Despite these changes, she reports no abnormal aches or pains, and no trouble chewing or swallowing. No constipation, diarrhea, blood in the urine, or blood in the stool. Her leg swelling has decreased.  Her current medications include Synthroid  25 mcg, olmesartan  20 mg daily, Norvasc  5 mg, and calcium with vitamin D .   She is not taking Aricept  for memory.   She has received her flu vaccine but has refused the shingles vaccine due to a family history of severe shingles pain.  She lives alone but has a caregiver who assists with meals and medications on days when her primary caregiver is not present. A camera is used to monitor her  well-being. She has not had any falls since January and is careful when moving around her home. Her daughter is with her frequently.     Review of Systems:  Review of Systems  Unable to perform ROS: Dementia    Past Medical History:  Diagnosis Date   Arthritis    Glaucoma    Hyperlipidemia    Hypertension    Osteoporosis    Thyroid  disease    Past Surgical History:  Procedure Laterality Date   ABDOMINAL HYSTERECTOMY  1965   partial   CHOLECYSTECTOMY  1989   SKIN SURGERY  02/2014   Basel Cell removed from right side of face    Social History:   reports that she has never smoked. She has never used smokeless tobacco. She reports that she does not drink alcohol  and does not use drugs.  Family History  Problem Relation Age of Onset   Cancer Mother    Cancer Father        lung   Heart disease Father     Medications: Patient's Medications  New Prescriptions   No medications on file  Previous Medications   AMLODIPINE  (NORVASC ) 5 MG TABLET    TAKE 1 TABLET BY MOUTH DAILY   CALCIUM CARBONATE-VIT D-MIN (CALCIUM 1200 PO)    Take 1 tablet by mouth daily.   CHOLECALCIFEROL (VITAMIN D ) 2000 UNITS CAPS    Take 1 capsule (2,000 Units total) by mouth daily.   DONEPEZIL  (ARICEPT ) 10 MG TABLET  TAKE 1 TABLET BY MOUTH AT  BEDTIME   LEVOTHYROXINE  (SYNTHROID ) 25 MCG TABLET    TAKE 1 TABLET BY MOUTH ONCE  DAILY BEFORE BREAKFAST ON AN  EMPTY STOMACH FOR THYROID    MULTIPLE VITAMINS-MINERALS (SENIOR MULTIVITAMIN PLUS PO)    Take by mouth every morning.   OLMESARTAN  (BENICAR ) 20 MG TABLET    TAKE 1 TABLET BY MOUTH DAILY   OMEGA-3 FATTY ACIDS (FISH OIL) 1000 MG CAPS    Take 1,000 capsules by mouth daily.   POLYETHYL GLYCOL-PROPYL GLYCOL (SYSTANE OP)    Apply 1 drop to eye as needed.   SIMVASTATIN  (ZOCOR ) 20 MG TABLET    TAKE 1 TABLET BY MOUTH DAILY FOR CHOLESTEROL  Modified Medications   No medications on file  Discontinued Medications   No medications on file    Physical  Exam:  Vitals:   01/25/24 1419  BP: 128/64  Pulse: 73  Temp: 97.6 F (36.4 C)  SpO2: 95%  Weight: 110 lb 9.6 oz (50.2 kg)  Height: 5' 1 (1.549 m)   Body mass index is 20.9 kg/m. Wt Readings from Last 3 Encounters:  01/25/24 110 lb 9.6 oz (50.2 kg)  07/30/23 121 lb 9.6 oz (55.2 kg)  07/24/23 124 lb 9.6 oz (56.5 kg)    Physical Exam Constitutional:      General: She is not in acute distress.    Appearance: She is well-developed. She is not diaphoretic.  HENT:     Head: Normocephalic and atraumatic.     Mouth/Throat:     Pharynx: No oropharyngeal exudate.  Eyes:     Conjunctiva/sclera: Conjunctivae normal.     Pupils: Pupils are equal, round, and reactive to light.  Cardiovascular:     Rate and Rhythm: Normal rate and regular rhythm.     Heart sounds: Normal heart sounds.  Pulmonary:     Effort: Pulmonary effort is normal.     Breath sounds: Normal breath sounds.  Abdominal:     General: Bowel sounds are normal.     Palpations: Abdomen is soft.  Musculoskeletal:        General: No tenderness.     Cervical back: Normal range of motion and neck supple.  Skin:    General: Skin is warm and dry.  Neurological:     Mental Status: She is alert.     Motor: Weakness present.     Gait: Gait abnormal.     Labs reviewed: Basic Metabolic Panel: Recent Labs    07/24/23 1414  NA 137  K 4.4  CL 103  CO2 26  GLUCOSE 90  BUN 22  CREATININE 1.03*  CALCIUM 9.6   Liver Function Tests: Recent Labs    07/24/23 1414  AST 19  ALT 10  BILITOT 0.8  PROT 6.3   No results for input(s): LIPASE, AMYLASE in the last 8760 hours. No results for input(s): AMMONIA in the last 8760 hours. CBC: Recent Labs    07/24/23 1414 07/30/23 1509  WBC 15.0* 14.8*  NEUTROABS 7,260 8,096*  HGB 13.2 12.7  HCT 39.3 37.1  MCV 97.0 95.6  PLT 152 293   Lipid Panel: No results for input(s): CHOL, HDL, LDLCALC, TRIG, CHOLHDL, LDLDIRECT in the last 8760  hours. TSH: No results for input(s): TSH in the last 8760 hours. A1C: Lab Results  Component Value Date   HGBA1C 5.5 11/02/2020     Assessment/Plan  Acquired hypothyroidism Assessment & Plan: On Synthroid  25 mcg daily. Thyroid  levels to be checked. -  Ordered thyroid  function tests.  Orders: -     TSH  Essential hypertension, benign Assessment & Plan: Blood pressure well-controlled on olmesartan  20 mg daily and Norvasc  5 mg daily.  Orders: -     CBC with Differential/Platelet -     Comprehensive metabolic panel with GFR  Moderate vascular dementia without behavioral disturbance, psychotic disturbance, mood disturbance, or anxiety (HCC) Assessment & Plan: Progressive decline. no acute changes in cognitive or functional status, continue supportive care.  May need more supervision in the future.  Discussed DNR    Stage 3a chronic kidney disease (HCC) Assessment & Plan: Encourage proper hydration Follow metabolic panel Avoid nephrotoxic meds (NSAIDS)   Orders: -     Comprehensive metabolic panel with GFR  Pure hypercholesterolemia Assessment & Plan: Continues on zocor    Orders: -     Lipid panel  Weight loss Assessment & Plan: Unintentional weight loss and decreased appetite Weight loss of 10-11 pounds since May, now 110 pounds. Decreased appetite likely due to aging and memory loss. - Liberalize diet and encourage nutritional supplements. - Consider appetite stimulant if more home assistance available.     Return in about 6 months (around 07/24/2024) for routine follow up, labs at time of visit.:   Elana Jian K. Caro BODILY [facility] [phone]

## 2024-01-26 ENCOUNTER — Other Ambulatory Visit: Payer: Self-pay | Admitting: Nurse Practitioner

## 2024-01-26 LAB — LIPID PANEL
Cholesterol: 175 mg/dL (ref <200)
HDL: 74 mg/dL (ref >=50)
LDL Cholesterol (Calc): 84 mg/dL
Non-HDL Cholesterol (Calc): 101 mg/dL (ref <130)
Total CHOL/HDL Ratio: 2.4 (calc) (ref <5.0)
Triglycerides: 79 mg/dL (ref <150)

## 2024-01-26 LAB — COMPREHENSIVE METABOLIC PANEL WITH GFR
AG Ratio: 1.9 (calc) (ref 1.0–2.5)
ALT: 8 U/L (ref 6–29)
AST: 21 U/L (ref 10–35)
Albumin: 4 g/dL (ref 3.6–5.1)
Alkaline phosphatase (APISO): 51 U/L (ref 37–153)
BUN: 20 mg/dL (ref 7–25)
CO2: 27 mmol/L (ref 20–32)
Calcium: 9.5 mg/dL (ref 8.6–10.4)
Chloride: 103 mmol/L (ref 98–110)
Creat: 0.91 mg/dL (ref 0.60–0.95)
Globulin: 2.1 g/dL (ref 1.9–3.7)
Glucose, Bld: 107 mg/dL (ref 65–139)
Potassium: 4 mmol/L (ref 3.5–5.3)
Sodium: 139 mmol/L (ref 135–146)
Total Bilirubin: 0.7 mg/dL (ref 0.2–1.2)
Total Protein: 6.1 g/dL (ref 6.1–8.1)
eGFR: 58 mL/min/1.73m2 — ABNORMAL LOW (ref >=60)

## 2024-01-26 LAB — CBC WITH DIFFERENTIAL/PLATELET
Absolute Lymphocytes: 7165 {cells}/uL — ABNORMAL HIGH (ref 850–3900)
Absolute Monocytes: 795 {cells}/uL (ref 200–950)
Basophils Absolute: 55 {cells}/uL (ref 0–200)
Basophils Relative: 0.4 %
Eosinophils Absolute: 192 {cells}/uL (ref 15–500)
Eosinophils Relative: 1.4 %
HCT: 39.1 % (ref 35.0–45.0)
Hemoglobin: 13 g/dL (ref 11.7–15.5)
MCH: 32.7 pg (ref 27.0–33.0)
MCHC: 33.2 g/dL (ref 32.0–36.0)
MCV: 98.2 fL (ref 80.0–100.0)
MPV: 10.1 fL (ref 7.5–12.5)
Monocytes Relative: 5.8 %
Neutro Abs: 5494 {cells}/uL (ref 1500–7800)
Neutrophils Relative %: 40.1 %
Platelets: 198 Thousand/uL (ref 140–400)
RBC: 3.98 Million/uL (ref 3.80–5.10)
RDW: 11.9 % (ref 11.0–15.0)
Total Lymphocyte: 52.3 %
WBC: 13.7 Thousand/uL — ABNORMAL HIGH (ref 3.8–10.8)

## 2024-01-26 LAB — TSH: TSH: 3.14 m[IU]/L (ref 0.40–4.50)

## 2024-01-27 ENCOUNTER — Ambulatory Visit: Payer: Self-pay | Admitting: Nurse Practitioner

## 2024-01-27 NOTE — Telephone Encounter (Signed)
 Daughter reported she was no longer taking during visit, please call to clarify.

## 2024-01-27 NOTE — Telephone Encounter (Signed)
 Medication no longer on patients med list. Sending to patients pcp for further review.

## 2024-01-27 NOTE — Telephone Encounter (Signed)
 Contacted phone number on file , no answer left voicemail for patient to give a call back.

## 2024-01-29 ENCOUNTER — Emergency Department (HOSPITAL_COMMUNITY)
Admission: EM | Admit: 2024-01-29 | Discharge: 2024-01-29 | Disposition: A | Discharge status: Home / Self Care | Attending: Emergency Medicine | Admitting: Emergency Medicine

## 2024-01-29 ENCOUNTER — Encounter (HOSPITAL_COMMUNITY): Payer: Self-pay

## 2024-01-29 ENCOUNTER — Telehealth: Payer: Self-pay

## 2024-01-29 ENCOUNTER — Other Ambulatory Visit: Payer: Self-pay

## 2024-01-29 ENCOUNTER — Ambulatory Visit: Payer: Self-pay | Admitting: Urgent Care

## 2024-01-29 ENCOUNTER — Ambulatory Visit
Admission: EM | Admit: 2024-01-29 | Discharge: 2024-01-29 | Disposition: A | Discharge status: Home / Self Care | Attending: Family Medicine | Admitting: Family Medicine

## 2024-01-29 ENCOUNTER — Emergency Department (HOSPITAL_COMMUNITY)

## 2024-01-29 ENCOUNTER — Other Ambulatory Visit (HOSPITAL_BASED_OUTPATIENT_CLINIC_OR_DEPARTMENT_OTHER): Payer: Self-pay

## 2024-01-29 ENCOUNTER — Other Ambulatory Visit (HOSPITAL_COMMUNITY): Payer: Self-pay

## 2024-01-29 ENCOUNTER — Ambulatory Visit (INDEPENDENT_AMBULATORY_CARE_PROVIDER_SITE_OTHER)

## 2024-01-29 DIAGNOSIS — S61216A Laceration without foreign body of right little finger without damage to nail, initial encounter: Secondary | ICD-10-CM

## 2024-01-29 DIAGNOSIS — Z23 Encounter for immunization: Secondary | ICD-10-CM | POA: Insufficient documentation

## 2024-01-29 DIAGNOSIS — M47812 Spondylosis without myelopathy or radiculopathy, cervical region: Secondary | ICD-10-CM | POA: Diagnosis not present

## 2024-01-29 DIAGNOSIS — S62636B Displaced fracture of distal phalanx of right little finger, initial encounter for open fracture: Secondary | ICD-10-CM

## 2024-01-29 DIAGNOSIS — W010XXA Fall on same level from slipping, tripping and stumbling without subsequent striking against object, initial encounter: Secondary | ICD-10-CM | POA: Insufficient documentation

## 2024-01-29 DIAGNOSIS — S6991XA Unspecified injury of right wrist, hand and finger(s), initial encounter: Secondary | ICD-10-CM | POA: Diagnosis present

## 2024-01-29 DIAGNOSIS — M79644 Pain in right finger(s): Secondary | ICD-10-CM | POA: Diagnosis not present

## 2024-01-29 DIAGNOSIS — S62622A Displaced fracture of medial phalanx of right middle finger, initial encounter for closed fracture: Secondary | ICD-10-CM | POA: Diagnosis not present

## 2024-01-29 DIAGNOSIS — S63256A Unspecified dislocation of right little finger, initial encounter: Secondary | ICD-10-CM | POA: Insufficient documentation

## 2024-01-29 DIAGNOSIS — I10 Essential (primary) hypertension: Secondary | ICD-10-CM | POA: Diagnosis not present

## 2024-01-29 DIAGNOSIS — S199XXA Unspecified injury of neck, initial encounter: Secondary | ICD-10-CM | POA: Diagnosis not present

## 2024-01-29 DIAGNOSIS — S63259A Unspecified dislocation of unspecified finger, initial encounter: Secondary | ICD-10-CM

## 2024-01-29 DIAGNOSIS — M19041 Primary osteoarthritis, right hand: Secondary | ICD-10-CM | POA: Diagnosis not present

## 2024-01-29 DIAGNOSIS — R9082 White matter disease, unspecified: Secondary | ICD-10-CM | POA: Diagnosis not present

## 2024-01-29 DIAGNOSIS — R519 Headache, unspecified: Secondary | ICD-10-CM | POA: Diagnosis not present

## 2024-01-29 DIAGNOSIS — S62626B Displaced fracture of medial phalanx of right little finger, initial encounter for open fracture: Secondary | ICD-10-CM

## 2024-01-29 DIAGNOSIS — S0990XA Unspecified injury of head, initial encounter: Secondary | ICD-10-CM | POA: Diagnosis not present

## 2024-01-29 DIAGNOSIS — S62626A Displaced fracture of medial phalanx of right little finger, initial encounter for closed fracture: Secondary | ICD-10-CM | POA: Diagnosis not present

## 2024-01-29 LAB — CBC
HCT: 37.1 % (ref 36.0–46.0)
Hemoglobin: 12.4 g/dL (ref 12.0–15.0)
MCH: 32.5 pg (ref 26.0–34.0)
MCHC: 33.4 g/dL (ref 30.0–36.0)
MCV: 97.4 fL (ref 80.0–100.0)
Platelets: 174 K/uL (ref 150–400)
RBC: 3.81 MIL/uL — ABNORMAL LOW (ref 3.87–5.11)
RDW: 13.2 % (ref 11.5–15.5)
WBC: 12.1 K/uL — ABNORMAL HIGH (ref 4.0–10.5)
nRBC: 0 % (ref 0.0–0.2)

## 2024-01-29 LAB — BASIC METABOLIC PANEL WITH GFR
Anion gap: 9 (ref 5–15)
BUN: 17 mg/dL (ref 8–23)
CO2: 25 mmol/L (ref 22–32)
Calcium: 9.1 mg/dL (ref 8.9–10.3)
Chloride: 105 mmol/L (ref 98–111)
Creatinine, Ser: 0.97 mg/dL (ref 0.44–1.00)
GFR, Estimated: 53 mL/min — ABNORMAL LOW (ref >60)
Glucose, Bld: 103 mg/dL — ABNORMAL HIGH (ref 70–99)
Potassium: 4.1 mmol/L (ref 3.5–5.1)
Sodium: 139 mmol/L (ref 135–145)

## 2024-01-29 MED ORDER — CEPHALEXIN 500 MG PO CAPS
500.0000 mg | ORAL_CAPSULE | Freq: Four times a day (QID) | ORAL | 0 refills | Status: AC
  Filled 2024-01-29: qty 20, 5d supply, fill #0

## 2024-01-29 MED ORDER — CEFAZOLIN SODIUM-DEXTROSE 2-4 GM/100ML-% IV SOLN
2.0000 g | Freq: Once | INTRAVENOUS | Status: AC
  Administered 2024-01-29: 2 g via INTRAVENOUS
  Filled 2024-01-29: qty 100

## 2024-01-29 MED ORDER — TETANUS-DIPHTH-ACELL PERTUSSIS 5-2-15.5 LF-MCG/0.5 IM SUSP
0.5000 mL | Freq: Once | INTRAMUSCULAR | Status: AC
  Administered 2024-01-29: 0.5 mL via INTRAMUSCULAR
  Filled 2024-01-29 (×2): qty 0.5

## 2024-01-29 MED ORDER — LIDOCAINE HCL (PF) 1 % IJ SOLN
10.0000 mL | Freq: Once | INTRAMUSCULAR | Status: AC
  Administered 2024-01-29: 10 mL
  Filled 2024-01-29: qty 10

## 2024-01-29 NOTE — ED Provider Notes (Signed)
 [place] EMERGENCY DEPARTMENT AT [facility] Provider Note  MDM   HPI/ROS:  Krystal [name] is a 89 y.o. female with a PMH of arthritis, HLD, HTN, osteoporosis, thyroid  disease who presents to the emergency department for right hip fracture.  Patient was taken to UC earlier today by her daughter given she had a mechanical ground-level fall, labs were mostly impact with her right hand and found to have an open displaced fracture of her fifth right finger.  Patient's daughter confirms that patient tripped over a rug and had a mechanical fall.  On my initial evaluation, patient is:  -Vital signs stable. Patient afebrile, hemodynamically stable, and non-toxic appearing. -Additional history obtained from patient's daughter at bedside  Physical exam is notable for: - Bony deformity of the right fifth finger with a laceration on the dorsal aspect and exposed bone at the DIP -- Skin tears over patient's right forearm and upper arm   CT cervical spine and CT head without any acute abnormality.  Patient's x-ray was done at urgent care today, able to see the images and read.  Patient has a fracture at the base of the fifth middle phalanx with subluxation at the fifth DIP joint Patient history is consistent with a mechanical witnessed fall after having tripped over a rug, therefore low suspicion for syncopal episode, weakness, neurologic dysfunction causing patient to fall.  Given open injury, patient given Ancef and Tdap.  Patient's finger reduced and splinted with laceration repair.  See the procedure note.  Discussed with hand surgeon, Dr. Brannon who reviewed patient's images, agreed with reduction and splinting and follow-up in hand clinic.   Interpretations, interventions, and the patient's course of care are documented below.   Disposition:  I discussed the plan for discharge with the patient and/or their surrogate at bedside prior to discharge and they were in agreement with the  plan and verbalized understanding of the return precautions provided. All questions answered to the best of my ability. Ultimately, the patient was discharged in stable condition with stable vital signs. I am reassured that they are capable of close follow up and good social support at home.   Clinical Impression: No diagnosis found.  Rx / DC Orders ED Discharge Orders     None       Clinical Complexity A medically appropriate history, review of systems, and physical exam was performed.  My independent interpretations of EKG, labs, and radiology are documented in the ED course above.   If decision rules were used in this patient's evaluation, they are listed below.   Click here for ABCD2, HEART and other calculatorsREFRESH Note before signing   Patient's presentation is most consistent with acute complicated illness / injury requiring diagnostic workup.  Medical Decision Making Amount and/or Complexity of Data Reviewed Labs: ordered. Radiology: ordered.  Risk Prescription drug management.    HPI/ROS      See MDM section for pertinent HPI and ROS. A complete ROS was performed with pertinent positives/negatives noted above.   Past Medical History:  Diagnosis Date   Arthritis    Glaucoma    Hyperlipidemia    Hypertension    Osteoporosis    Thyroid  disease     Past Surgical History:  Procedure Laterality Date   ABDOMINAL HYSTERECTOMY  1965   partial   CHOLECYSTECTOMY  1989   SKIN SURGERY  02/2014   Basel Cell removed from right side of face       Physical Exam   Vitals:  01/29/24 1248 01/29/24 1308  BP: (!) 116/55   Pulse: 67   Resp: 16   Temp: 97.6 F (36.4 C)   TempSrc: Oral   SpO2: 100%   Weight:  49.9 kg  Height:  5' (1.524 m)    Physical Exam   Procedures   If procedures were preformed on this patient, they are listed below:  .Laceration Repair  Date/Time: 02/02/2024 10:52 AM  Performed by: Billy Pal, MD Authorized by: Patt Alm Macho, MD   Consent:    Consent obtained:  Verbal   Consent given by:  Patient   Risks discussed:  Infection, need for additional repair, nerve damage, poor wound healing, poor cosmetic result, pain and vascular damage   Alternatives discussed:  No treatment, delayed treatment and observation Universal protocol:    Patient identity confirmed:  Verbally with patient and arm band Anesthesia:    Anesthesia method:  Nerve block   Block location:  5th digit, R   Block needle gauge:  27 G   Block anesthetic:  Lidocaine  1% w/o epi   Block injection procedure:  Anatomic landmarks identified   Block outcome:  Anesthesia achieved Laceration details:    Location:  Finger   Finger location:  R small finger   Length (cm):  2   Depth (mm):  1 Pre-procedure details:    Preparation:  Patient was prepped and draped in usual sterile fashion and imaging obtained to evaluate for foreign bodies Exploration:    Limited defect created (wound extended): yes     Hemostasis achieved with:  Direct pressure   Wound exploration: wound explored through full range of motion and entire depth of wound visualized     Wound extent: underlying fracture   Treatment:    Area cleansed with:  Povidone-iodine   Amount of cleaning:  Extensive   Irrigation solution:  Sterile saline and sterile water   Irrigation volume:  1L   Irrigation method:  Syringe   Visualized foreign bodies/material removed: no   Skin repair:    Repair method:  Sutures   Suture size:  4-0   Suture material:  Fast-absorbing gut   Suture technique:  Simple interrupted   Number of sutures:  3 Approximation:    Approximation:  Close Repair type:    Repair type:  Simple Post-procedure details:    Dressing:  Splint for protection   Procedure completion:  Tolerated well, no immediate complications .Reduction of dislocation  Date/Time: 02/02/2024 10:52 AM  Performed by: Billy Pal, MD Authorized by: Patt Alm Macho, MD   Consent: Verbal consent obtained Risks and benefits: risks, benefits and alternatives were discussed Consent given by: patient Patient identity confirmed: verbally with patient and hospital-assigned identification number Preparation: Patient was prepped and draped in the usual sterile fashion. Local anesthesia used: yes Anesthesia: digital block  Anesthesia: Local anesthesia used: yes Local Anesthetic: lidocaine  1% without epinephrine Anesthetic total: 7 mL  Sedation: Patient sedated: no  Patient tolerance: patient tolerated the procedure well with no immediate complications Comments: Right distal pinky      Please note that this documentation was produced with the assistance of voice-to-text technology and may contain errors.     Billy Pal, MD 02/02/24 1054    Patt Alm Macho, MD 02/02/24 [phone]

## 2024-01-29 NOTE — ED Provider Notes (Signed)
 [facility] URGENT [facility]ARE [facility]  Note:  This document was prepared using Conservation officer, historic buildings and may include unintentional dictation errors.  MRN: [id number] DOB: 12-07-1925  Subjective:   Krystal [name] is a 89 y.o. female presenting for severe right hand pain, right fifth finger pain.  Patient suffered an accidental fall and absorbed most of the impact on her right hand.  Has had some scrapes and bruises.  Has been bleeding from her right 5th finger.  No head injury, loss of consciousness.  No current facility-administered medications for this encounter.  Current Outpatient Medications:    amLODipine  (NORVASC ) 5 MG tablet, TAKE 1 TABLET BY MOUTH DAILY, Disp: 90 tablet, Rfl: 3   Calcium Carbonate-Vit D-Min (CALCIUM 1200 PO), Take 1 tablet by mouth daily., Disp: , Rfl:    Cholecalciferol (VITAMIN D ) 2000 UNITS CAPS, Take 1 capsule (2,000 Units total) by mouth daily., Disp: 30 capsule, Rfl: 3   levothyroxine  (SYNTHROID ) 25 MCG tablet, TAKE 1 TABLET BY MOUTH ONCE  DAILY BEFORE BREAKFAST ON AN  EMPTY STOMACH FOR THYROID , Disp: 90 tablet, Rfl: 3   Multiple Vitamins-Minerals (SENIOR MULTIVITAMIN PLUS PO), Take by mouth every morning., Disp: , Rfl:    olmesartan  (BENICAR ) 20 MG tablet, TAKE 1 TABLET BY MOUTH DAILY, Disp: 90 tablet, Rfl: 3   Omega-3 Fatty Acids (FISH OIL) 1000 MG CAPS, Take 1,000 capsules by mouth daily., Disp: , Rfl:    Polyethyl Glycol-Propyl Glycol (SYSTANE OP), Apply 1 drop to eye as needed., Disp: , Rfl:    simvastatin  (ZOCOR ) 20 MG tablet, TAKE 1 TABLET BY MOUTH DAILY FOR CHOLESTEROL, Disp: 90 tablet, Rfl: 3   No Known Allergies  Past Medical History:  Diagnosis Date   Arthritis    Glaucoma    Hyperlipidemia    Hypertension    Osteoporosis    Thyroid  disease      Past Surgical History:  Procedure Laterality Date   ABDOMINAL HYSTERECTOMY  1965   partial   CHOLECYSTECTOMY  1989   SKIN SURGERY  02/2014   Basel Cell removed from right  side of face     Family History  Problem Relation Age of Onset   Cancer Mother    Cancer Father        lung   Heart disease Father     Social History   Tobacco Use   Smoking status: Never   Smokeless tobacco: Never   Tobacco comments:    never used tobacco  Vaping Use   Vaping status: Never Used  Substance Use Topics   Alcohol  use: No    Alcohol /week: 0.0 standard drinks of alcohol    Drug use: No    ROS   Objective:   Vitals: BP 122/67 (BP Location: Left Arm)   Pulse 71   Temp 97.9 F (36.6 C) (Oral)   Resp 20   SpO2 98%   Physical Exam Constitutional:      General: She is not in acute distress.    Appearance: Normal appearance. She is well-developed. She is not ill-appearing, toxic-appearing or diaphoretic.  HENT:     Head: Normocephalic and atraumatic.     Nose: Nose normal.     Mouth/Throat:     Mouth: Mucous membranes are moist.  Eyes:     General: No scleral icterus.       Right eye: No discharge.        Left eye: No discharge.     Extraocular Movements: Extraocular movements intact.  Cardiovascular:  Rate and Rhythm: Normal rate.  Pulmonary:     Effort: Pulmonary effort is normal.  Musculoskeletal:       Hands:     Comments: Multiple contusions of the distal upper extremities.  Skin:    General: Skin is warm and dry.  Neurological:     General: No focal deficit present.     Mental Status: She is alert and oriented to person, place, and time.  Psychiatric:        Mood and Affect: Mood normal.        Behavior: Behavior normal.    Patient has a displaced open fracture of the right fifth finger.  Dressing was placed.  Assessment and Plan :   PDMP not reviewed this encounter.  1. Open displaced fracture of middle phalanx of right little finger, initial encounter   2. Finger pain, right    Patient is in need of a higher level of care than we can provide in the urgent care setting.  Reviewed images in clinic with the patient and her  daughter, radiology overread pending.  She is to be transported to the emergency room by personal vehicle now.   Christopher Savannah, PA-C 01/29/24 1226

## 2024-01-29 NOTE — Telephone Encounter (Signed)
 [facility] called about a consult regarding open distal phalanx fx. Dr Erwin is Practice call. Dr Almosa, ER dr was given his cell number.

## 2024-01-29 NOTE — Discharge Instructions (Addendum)
 Krystal [name]:  Thank you for allowing us  to take care of you today.  We hope you begin feeling better soon. You were seen today for finger fracture and a fall.  While you were here we performed a physical examination, CT imaging that was negative for any acute head bleed or cervical injury.  While you were here we washed out your finger, relocated it and placed 3 stitches in the distal portion.  Orthopedic surgery has been contacted, they will expect your call on Monday for follow-up appointment.  Please take Keflex  4 times a day for the next 5 days to prevent infection.  To-Do:  Please follow-up with your primary doctor within the next 2-3 days. It is important that you review any labs or imaging results (if any) that you had today with them. Your preliminary imaging results (if any) are attached. Please return to the Emergency Department or call 911 if you experience chest pain, shortness of breath, severe pain, severe fever, altered mental status, or have any reason to think that you need emergency medical care.  Thank you again.  Hope you feel better soon.  Department of Emergency Medicine

## 2024-01-29 NOTE — ED Triage Notes (Signed)
 Per pt and daughter-pt fell ~5am-multiple skin tears and right pinky finger injury-NAD-slow gait with own cane

## 2024-01-29 NOTE — ED Provider Triage Note (Signed)
 Emergency Medicine Provider Triage Evaluation Note  Krystal [name] , a 89 y.o. female  was evaluated in triage.  Pt complains of open right finger fracture.  Seen at an urgent care earlier today had x-rays that showed an open distal phalanx fracture.  She also has abrasions to the right arm.  Unknown last tetanus vaccine.  No head injuries.  Sent in for management of open finger fracture.  Review of Systems  Positive: Finger fracture Negative: Head injury  Physical Exam  BP (!) 116/55   Pulse 67   Temp 97.6 F (36.4 C) (Oral)   Resp 16   SpO2 100%  Gen:   Awake, no distress   Resp:  Normal effort  MSK:   Moves extremities without difficulty  Other:  Distal cap refill intact, fingers currently bandaged  Medical Decision Making  Medically screening exam initiated at 1:08 PM.  Appropriate orders placed.  Krystal [name] was informed that the remainder of the evaluation will be completed by another provider, this initial triage assessment does not replace that evaluation, and the importance of remaining in the ED until their evaluation is complete.     Arloa Chroman, PA-C 01/29/24 1310

## 2024-01-29 NOTE — ED Triage Notes (Signed)
 Patient bib daughter, patient is unsure why she is here. Daughter states that she has a fracture on her right pinky finger. She also wants her seen for the other abrasions on her arms.

## 2024-01-29 NOTE — Discharge Instructions (Signed)
 Please present to the emergency room as you are in need of a higher level of care than we can provide in the urgent care setting. This includes management for an open fracture of the right pinky finger.

## 2024-01-29 NOTE — ED Notes (Signed)
 Patient is being discharged from the Urgent Care and sent to the Emergency Department via POV . Per [place] PA C, patient is in need of higher level of care due to open finger fx. Patient is aware and verbalizes understanding of plan of care.  Vitals:   01/29/24 1127  BP: 122/67  Pulse: 71  Resp: 20  Temp: 97.9 F (36.6 C)  SpO2: 98%

## 2024-01-31 DIAGNOSIS — R634 Abnormal weight loss: Secondary | ICD-10-CM | POA: Insufficient documentation

## 2024-01-31 NOTE — Assessment & Plan Note (Signed)
 On Synthroid  25 mcg daily. Thyroid  levels to be checked. - Ordered thyroid  function tests.

## 2024-01-31 NOTE — Assessment & Plan Note (Signed)
 Blood pressure well-controlled on olmesartan  20 mg daily and Norvasc  5 mg daily.

## 2024-01-31 NOTE — Assessment & Plan Note (Signed)
 Encourage proper hydration Follow metabolic panel Avoid nephrotoxic meds (NSAIDS)

## 2024-01-31 NOTE — Assessment & Plan Note (Signed)
Continues on zocor

## 2024-01-31 NOTE — Assessment & Plan Note (Signed)
 Progressive decline. no acute changes in cognitive or functional status, continue supportive care.  May need more supervision in the future.  Discussed DNR

## 2024-01-31 NOTE — Assessment & Plan Note (Signed)
 Unintentional weight loss and decreased appetite Weight loss of 10-11 pounds since May, now 110 pounds. Decreased appetite likely due to aging and memory loss. - Liberalize diet and encourage nutritional supplements. - Consider appetite stimulant if more home assistance available.

## 2024-02-01 ENCOUNTER — Encounter: Payer: Self-pay | Admitting: Nurse Practitioner

## 2024-02-01 ENCOUNTER — Ambulatory Visit (INDEPENDENT_AMBULATORY_CARE_PROVIDER_SITE_OTHER): Admitting: Nurse Practitioner

## 2024-02-01 VITALS — BP 112/64 | HR 62 | Temp 97.6°F | Ht 60.0 in | Wt 114.8 lb

## 2024-02-01 DIAGNOSIS — T148XXA Other injury of unspecified body region, initial encounter: Secondary | ICD-10-CM | POA: Diagnosis not present

## 2024-02-01 DIAGNOSIS — S63259D Unspecified dislocation of unspecified finger, subsequent encounter: Secondary | ICD-10-CM | POA: Diagnosis not present

## 2024-02-01 NOTE — Progress Notes (Signed)
 Careteam: Patient Care Team: Caro Harlene POUR, NP as PCP - General (Geriatric Medicine) Timmy Maude SAUNDERS, MD as Consulting Physician (Oncology) Patrcia Sharper, MD as Consulting Physician (Ophthalmology)  PLACE OF SERVICE:  [facility]  Advanced Directive information    No Known Allergies  Chief Complaint  Patient presents with   Hospitalization Follow-up    Pt declined Shingle vaccine - pt stated she's been fine since the hospital. Pt tripped over blanket injuries on finger ( broken ) bruises on left leg.     HPI:  Discussed the use of AI scribe software for clinical note transcription with the patient, who gave verbal consent to proceed.  History of Present Illness Krystal [name] is a 89 year old female who presents for follow-up after a fall resulting in a compound fracture of the little finger.  She experienced a fall during the night after tripping over a blanket that had fallen off the bed, resulting in a compound fracture of her little finger and multiple skin abrasions. Initially, she was taken to urgent care, where it was determined that she needed to go to the emergency department due to the severity of the hand injury.  At the emergency department, her finger was sutured, and she is scheduled to see a hand specialist the following day. The sutures are located on her finger. She has no pain currently.  In addition to the finger injury, she sustained skin tears on her arms and a cut on her left wrist. She managed to move herself after the fall, scooting over to lean against her love seat. Her caregiver found her the next morning, and she was subsequently taken for medical evaluation.  She has been provided with a splint for her finger, but she has difficulty keeping it on. No report of head injury or loss of consciousness during the fall, and a CT scan was performed to rule out any head trauma. No chest pain or irregular heartbeats.  ***  Review of Systems:   Review of Systems  Unable to perform ROS: Dementia  ***  Past Medical History:  Diagnosis Date   Arthritis    Glaucoma    Hyperlipidemia    Hypertension    Osteoporosis    Thyroid  disease    Past Surgical History:  Procedure Laterality Date   ABDOMINAL HYSTERECTOMY  1965   partial   CHOLECYSTECTOMY  1989   SKIN SURGERY  02/2014   Basel Cell removed from right side of face    Social History:   reports that she has never smoked. She has never used smokeless tobacco. She reports that she does not drink alcohol  and does not use drugs.  Family History  Problem Relation Age of Onset   Cancer Mother    Cancer Father        lung   Heart disease Father     Medications: Patient's Medications  New Prescriptions   No medications on file  Previous Medications   AMLODIPINE  (NORVASC ) 5 MG TABLET    TAKE 1 TABLET BY MOUTH DAILY   CALCIUM CARBONATE-VIT D-MIN (CALCIUM 1200 PO)    Take 1 tablet by mouth daily.   CEPHALEXIN  (KEFLEX ) 500 MG CAPSULE    Take 1 capsule (500 mg total) by mouth 4 (four) times daily for 5 days.   CHOLECALCIFEROL (VITAMIN D ) 2000 UNITS CAPS    Take 1 capsule (2,000 Units total) by mouth daily.   LEVOTHYROXINE  (SYNTHROID ) 25 MCG TABLET    TAKE 1  TABLET BY MOUTH ONCE  DAILY BEFORE BREAKFAST ON AN  EMPTY STOMACH FOR THYROID    MULTIPLE VITAMINS-MINERALS (SENIOR MULTIVITAMIN PLUS PO)    Take by mouth every morning.   OLMESARTAN  (BENICAR ) 20 MG TABLET    TAKE 1 TABLET BY MOUTH DAILY   OMEGA-3 FATTY ACIDS (FISH OIL) 1000 MG CAPS    Take 1,000 capsules by mouth daily.   POLYETHYL GLYCOL-PROPYL GLYCOL (SYSTANE OP)    Apply 1 drop to eye as needed.   SIMVASTATIN  (ZOCOR ) 20 MG TABLET    TAKE 1 TABLET BY MOUTH DAILY FOR CHOLESTEROL  Modified Medications   No medications on file  Discontinued Medications   No medications on file    Physical Exam:  Vitals:   02/01/24 1359  BP: 112/64  Pulse: 62  Temp: 97.6 F (36.4 C)  SpO2: 98%  Weight: 114 lb 12.8 oz (52.1  kg)  Height: 5' (1.524 m)   Body mass index is 22.42 kg/m. Wt Readings from Last 3 Encounters:  02/01/24 114 lb 12.8 oz (52.1 kg)  01/29/24 110 lb (49.9 kg)  01/25/24 110 lb 9.6 oz (50.2 kg)    Physical Exam Constitutional:      Appearance: Normal appearance.  Pulmonary:     Effort: Pulmonary effort is normal.  Neurological:     Mental Status: She is alert. Mental status is at baseline.  Psychiatric:        Mood and Affect: Mood normal.   ***  Labs reviewed: Basic Metabolic Panel: Recent Labs    07/24/23 1414 01/25/24 1439 01/29/24 1608  NA 137 139 139  K 4.4 4.0 4.1  CL 103 103 105  CO2 26 27 25   GLUCOSE 90 107 103*  BUN 22 20 17   CREATININE 1.03* 0.91 0.97  CALCIUM 9.6 9.5 9.1  TSH  --  3.14  --    Liver Function Tests: Recent Labs    07/24/23 1414 01/25/24 1439  AST 19 21  ALT 10 8  BILITOT 0.8 0.7  PROT 6.3 6.1   No results for input(s): LIPASE, AMYLASE in the last 8760 hours. No results for input(s): AMMONIA in the last 8760 hours. CBC: Recent Labs    07/24/23 1414 07/30/23 1509 01/25/24 1439 01/29/24 1608  WBC 15.0* 14.8* 13.7* 12.1*  NEUTROABS 7,260 8,096* 5,494  --   HGB 13.2 12.7 13.0 12.4  HCT 39.3 37.1 39.1 37.1  MCV 97.0 95.6 98.2 97.4  PLT 152 293 198 174   Lipid Panel: Recent Labs    01/25/24 1439  CHOL 175  HDL 74  LDLCALC 84  TRIG 79  CHOLHDL 2.4   TSH: Recent Labs    01/25/24 1439  TSH 3.14   A1C: Lab Results  Component Value Date   HGBA1C 5.5 11/02/2020   CT Cervical Spine Wo Contrast Result Date: 01/29/2024 EXAM: CT CERVICAL SPINE WITHOUT CONTRAST 01/29/2024 05:15:00 PM TECHNIQUE: CT of the cervical spine was performed without the administration of intravenous contrast. Multiplanar reformatted images are provided for review. Automated exposure control, iterative reconstruction, and/or weight based adjustment of the mA/kV was utilized to reduce the radiation dose to as low as reasonably achievable.  COMPARISON: None available. CLINICAL HISTORY: Neck trauma (Age >= 65y) FINDINGS: CERVICAL SPINE: BONES AND ALIGNMENT: Motion obscures detail. No definite acute fracture. Retrolisthesis of C3 and anterolisthesis of C4 is presumed chronic. DEGENERATIVE CHANGES: Multilevel age related spondylosis and facet arthropathy. SOFT TISSUES: No prevertebral soft tissue swelling. IMPRESSION: 1. No definite acute fracture, although evaluation  is limited by motion artifact. Electronically signed by: Norman Gatlin MD 01/29/2024 05:55 PM EST RP Workstation: [id number]   CT Head Wo Contrast Result Date: 01/29/2024 EXAM: CT HEAD WITHOUT CONTRAST 01/29/2024 05:15:00 PM TECHNIQUE: CT of the head was performed without the administration of intravenous contrast. Automated exposure control, iterative reconstruction, and/or weight based adjustment of the mA/kV was utilized to reduce the radiation dose to as low as reasonably achievable. COMPARISON: 1 / 13 / 20. CLINICAL HISTORY: Head trauma, minor (Age >= 65y). FINDINGS: BRAIN AND VENTRICLES: No acute hemorrhage. No evidence of acute infarct. No hydrocephalus. No extra-axial collection. No mass effect or midline shift. Age-related cerebral volume loss. Mild periventricular and deep cerebral white matter disease. Mild calcific atheromatous disease within the carotid siphons. ORBITS: No acute abnormality. Status post bilateral lens surgery. SINUSES: No acute abnormality. SOFT TISSUES AND SKULL: No acute soft tissue abnormality. No skull fracture. IMPRESSION: 1. No acute intracranial abnormality related to the head trauma. Electronically signed by: Norman Gatlin MD 01/29/2024 05:54 PM EST RP Workstation: [id number]   DG Finger Little Right Result Date: 01/29/2024 EXAM: 3 VIEW(S) XRAY OF THE RIGHT FIFTH FINGER 01/29/2024 12:03:19 PM COMPARISON: 03/02/2018 CLINICAL HISTORY: right fifth finger injury FINDINGS: BONES AND JOINTS: Fracture at the base of the fifth middle phalanx, with  subluxation at the fifth DIP joint. Severe osteoarthritis of the interphalangeal joints. SOFT TISSUES: Diffuse soft tissue swelling. IMPRESSION: 1. Fracture at the base of the fifth middle phalanx with subluxation at the fifth DIP joint. 2. Severe osteoarthritis of the interphalangeal joints. 3. Diffuse soft tissue swelling. Electronically signed by: Donnice Mania MD 01/29/2024 01:38 PM EST RP Workstation: [id number]     Assessment/Plan *** There are no diagnoses linked to this encounter.   No follow-ups on file.: ***  Roseann Kees K. Caro BODILY [facility] [phone]

## 2024-02-02 ENCOUNTER — Ambulatory Visit: Admitting: Orthopedic Surgery

## 2024-02-02 ENCOUNTER — Other Ambulatory Visit: Payer: Self-pay

## 2024-02-02 DIAGNOSIS — M79644 Pain in right finger(s): Secondary | ICD-10-CM

## 2024-02-02 DIAGNOSIS — S62666A Nondisplaced fracture of distal phalanx of right little finger, initial encounter for closed fracture: Secondary | ICD-10-CM

## 2024-02-02 NOTE — Progress Notes (Signed)
 Krystal [name] - 89 y.o. female MRN [id number]  Date of birth: 01-16-1926  Office Visit Note: Visit Date: 02/02/2024 PCP: Caro Harlene POUR, NP Referred by: Caro Harlene POUR, NP  Subjective: No chief complaint on file.  HPI: Krystal [name] is a pleasant 89 y.o. female with underlying dementia who presents today for evaluation of right small finger injury sustained approximate 4 days prior.  She was seen in the emergency department setting, underwent clinical and radiographic check which showed a displaced fracture of the distal phalanx with associated soft tissue injury.  Patient underwent bedside irrigation and closure with splint application.  She presents today with minimal pain, bandages in place over the small finger.  Pertinent ROS were reviewed with the patient and found to be negative unless otherwise specified above in HPI.   Visit Reason: right little finger Duration of symptoms: 4 days Hand dominance: right Occupation: retired Diabetic: No Smoking: No Heart/Lung History: Blood Thinners: none  Prior Testing/EMG: xrays Injections (Date): none Treatments: splint Prior Surgery: none    Assessment & Plan: Visit Diagnoses:  1. Closed nondisplaced fracture of distal phalanx of right little finger, initial encounter   2. Pain in right finger(s)     Plan: Extensive discussion was had with the patient and caregiver today regarding her right small finger injury.  Fortunately, her wound appears to be well-healing after the emergency department bedside irrigation and closure.  No active signs of infection.  Given her underlying functional status and comorbidities, we can continue with conservative measures for the time being.  She does have significant underlying arthritis as well.  There is no pain on examination today, no active erythema or drainage.  We continue with finger splinting for the time being, my recommendation was also for buddy taping in order to  give it increased ability over the next 2 to 3 weeks.  She is welcome to follow-up with me in approximately 1 month for an as-needed basis moving forward.  Sutures that have been placed are absorbable in nature.  Follow-up: No follow-ups on file.   Meds & Orders: No orders of the defined types were placed in this encounter.   Orders Placed This Encounter  Procedures   XR Finger Little Right     Procedures: No procedures performed      Clinical History: No specialty comments available.  She reports that she has never smoked. She has never used smokeless tobacco. No results for input(s): HGBA1C, LABURIC in the last 8760 hours.  Objective:   Vital Signs: There were no vitals taken for this visit.  Physical Exam  Gen: Well-appearing, in no acute distress; non-toxic CV: Regular Rate. Well-perfused. Warm.  Resp: Breathing unlabored on room air; no wheezing. Psych: Fluid speech in conversation; appropriate affect; normal thought process  Ortho Exam Right small finger: - Well-healing laceration site, sutures in place with skin edges well-approximated, minimal pain, no significant drainage or erythema   Imaging: XR Finger Little Right Result Date: 02/02/2024 X-rays of the right small finger show diffuse generalized underlying DIP and PIP arthritic changes.  Prior distal phalanx fracture with underlying comminution is visualized   Past Medical/Family/Surgical/Social History: Medications & Allergies reviewed per EMR, new medications updated. Patient Active Problem List   Diagnosis Date Noted   Weight loss 01/31/2024   Moderate vascular dementia without behavioral disturbance, psychotic disturbance, mood disturbance, or anxiety (HCC) 07/28/2023   Stage 3a chronic kidney disease (HCC) 07/28/2023   Lower extremity edema 07/28/2023   Lumbar  back pain with radiculopathy affecting right lower extremity 04/30/2020   Mild cognitive impairment with memory loss 10/01/2017   Acquired  hypothyroidism 10/01/2017   Balance problem 10/01/2017   Pure hypercholesterolemia 09/29/2016   Essential hypertension, benign 09/29/2016   Right torticollis 09/10/2015   Great toe pain 09/10/2015   Neuropathic pain of hand 07/20/2014   Hyperglycemia 07/20/2014   Chronic lymphocytic leukemia (HCC) 04/24/2014   Past Medical History:  Diagnosis Date   Arthritis    Glaucoma    Hyperlipidemia    Hypertension    Osteoporosis    Thyroid  disease    Family History  Problem Relation Age of Onset   Cancer Mother    Cancer Father        lung   Heart disease Father    Past Surgical History:  Procedure Laterality Date   ABDOMINAL HYSTERECTOMY  1965   partial   CHOLECYSTECTOMY  1989   SKIN SURGERY  02/2014   Basel Cell removed from right side of face    Social History   Occupational History   Not on file  Tobacco Use   Smoking status: Never   Smokeless tobacco: Never   Tobacco comments:    never used tobacco  Vaping Use   Vaping status: Never Used  Substance and Sexual Activity   Alcohol  use: No    Alcohol /week: 0.0 standard drinks of alcohol    Drug use: No   Sexual activity: Not Currently    Krystal [name]) Arlinda, M.D. [facility], Hand Surgery

## 2024-04-04 ENCOUNTER — Encounter: Payer: Self-pay | Admitting: Nurse Practitioner

## 2024-04-04 ENCOUNTER — Ambulatory Visit: Payer: Medicare Other | Admitting: Nurse Practitioner

## 2024-04-04 DIAGNOSIS — Z0000 Encounter for general adult medical examination without abnormal findings: Secondary | ICD-10-CM

## 2024-04-04 NOTE — Progress Notes (Signed)
" ° °  This service is provided via telemedicine  No vital signs collected/recorded due to the encounter was a telemedicine visit.   Location of patient (ex: home, work):  Home  Patient consents to a telephone visit: Yes  Location of the provider (ex: office, home):  [facility], Office   Name of any referring provider:  N/A  Names of all persons participating in the telemedicine service and their role in the encounter:  S.Chrae B/CMA, Harlene An, NP, Zada (daughter), and Patient   Time spent on call:  10 min with medical assistant   "

## 2024-04-04 NOTE — Progress Notes (Signed)
 "  Chief Complaint  Patient presents with   Medicare Wellness    Annual wellness visit via video     Subjective:   Krystal [name] is a 89 y.o. female who presents for a Medicare Annual Wellness Visit.  Visit info / Clinical Intake: Medicare Wellness Visit Type:: Subsequent Annual Wellness Visit Persons participating in visit and providing information:: patient & caregiver Medicare Wellness Visit Mode:: Video Since this visit was completed virtually, some vitals may be partially provided or unavailable. Missing vitals are due to the limitations of the virtual format.: Unable to obtain vitals - no equipment If Telephone or Video please confirm:: I connected with patient using audio/video enable telemedicine. I verified patient identity with two identifiers, discussed telehealth limitations, and patient agreed to proceed. Patient Location:: Home Provider Location:: Office Interpreter Needed?: No Pre-visit prep was completed: yes AWV questionnaire completed by patient prior to visit?: no Living arrangements:: (!) lives alone Patient's Overall Health Status Rating: good Typical amount of pain: none Does pain affect daily life?: (!) yes Are you currently prescribed opioids?: no  Dietary Habits and Nutritional Risks How many meals a day?: 2 Eats fruit and vegetables daily?: yes Most meals are obtained by: having others provide food In the last 2 weeks, have you had any of the following?: none Diabetic:: no  Functional Status Activities of Daily Living (to include ambulation/medication): (!) Needs Assist Feeding: Independent Dressing/Grooming: Needs assistance Bathing: Independent Toileting: Independent Transfer: Independent Ambulation: Independent Medication Administration: Dependent Home Management (perform basic housework or laundry): (Patient-Rptd) Dependent Manage your own finances?: (!) no (daughter helps) Primary transportation is: family / friends Concerns about  vision?: no *vision screening is required for WTM* Concerns about hearing?: no  Fall Screening Falls in the past year?: 1 Number of falls in past year: 1 Was there an injury with Fall?: 1 Fall Risk Category Calculator: 3 Patient Fall Risk Level: High Fall Risk  Fall Risk Patient at Risk for Falls Due to: Impaired balance/gait; History of fall(s) Fall risk Follow up: Falls evaluation completed  Home and Transportation Safety: All rugs have non-skid backing?: yes All stairs or steps have railings?: N/A, no stairs Grab bars in the bathtub or shower?: yes Have non-skid surface in bathtub or shower?: yes Good home lighting?: yes Regular seat belt use?: yes Hospital stays in the last year:: no  Cognitive Assessment Difficulty concentrating, remembering, or making decisions? : yes Will 6CIT or Mini Cog be Completed: yes What year is it?: 4 points What month is it?: 3 points Give patient an address phrase to remember (5 components): [address] [place]  About what time is it?: 0 points Count backwards from 20 to 1: 4 points Say the months of the year in reverse: 4 points Repeat the address phrase from earlier: 10 points 6 CIT Score: 25 points  Advance Directives (For Healthcare) Does Patient Have a Medical Advance Directive?: Yes Does patient want to make changes to medical advance directive?: No - Patient declined Type of Advance Directive: Healthcare Power of [place]; Living will; Out of facility DNR (pink MOST or yellow form) Copy of Healthcare Power of Attorney in Chart?: Yes - validated most recent copy scanned in chart (See row information) Copy of Living Will in Chart?: Yes - validated most recent copy scanned in chart (See row information) Pre-existing out of facility DNR order (yellow form or pink MOST form): Yellow form placed in chart (order not valid for inpatient use)  Reviewed/Updated  Reviewed/Updated: Reviewed All (Medical,  Surgical, Family, Medications,  Allergies, Care Teams, Patient Goals)    Allergies (verified) Patient has no known allergies.   Current Medications (verified) Outpatient Encounter Medications as of 04/04/2024  Medication Sig   amLODipine  (NORVASC ) 5 MG tablet TAKE 1 TABLET BY MOUTH DAILY   Calcium Carbonate-Vit D-Min (CALCIUM 1200 PO) Take 1 tablet by mouth daily.   Cholecalciferol (VITAMIN D ) 2000 UNITS CAPS Take 1 capsule (2,000 Units total) by mouth daily.   donepezil  (ARICEPT ) 10 MG tablet Take 10 mg by mouth at bedtime.   levothyroxine  (SYNTHROID ) 25 MCG tablet TAKE 1 TABLET BY MOUTH ONCE  DAILY BEFORE BREAKFAST ON AN  EMPTY STOMACH FOR THYROID    Multiple Vitamins-Minerals (SENIOR MULTIVITAMIN PLUS PO) Take by mouth every morning.   olmesartan  (BENICAR ) 20 MG tablet TAKE 1 TABLET BY MOUTH DAILY   Omega-3 Fatty Acids (FISH OIL) 1000 MG CAPS Take 1,000 capsules by mouth daily.   Polyethyl Glycol-Propyl Glycol (SYSTANE OP) Apply 1 drop to eye as needed.   simvastatin  (ZOCOR ) 20 MG tablet TAKE 1 TABLET BY MOUTH DAILY FOR CHOLESTEROL   No facility-administered encounter medications on file as of 04/04/2024.    History: Past Medical History:  Diagnosis Date   Arthritis    Glaucoma    Hyperlipidemia    Hypertension    Osteoporosis    Thyroid  disease    Past Surgical History:  Procedure Laterality Date   ABDOMINAL HYSTERECTOMY  1965   partial   CHOLECYSTECTOMY  1989   SKIN SURGERY  02/2014   Basel Cell removed from right side of face    Family History  Problem Relation Age of Onset   Cancer Mother    Cancer Father        lung   Heart disease Father    Social History   Occupational History   Not on file  Tobacco Use   Smoking status: Never   Smokeless tobacco: Never   Tobacco comments:    never used tobacco  Vaping Use   Vaping status: Never Used  Substance and Sexual Activity   Alcohol  use: No    Alcohol /week: 0.0 standard drinks of alcohol    Drug use: No   Sexual activity: Not Currently    Tobacco Counseling Counseling given: Not Answered Tobacco comments: never used tobacco  SDOH Screenings   Food Insecurity: No Food Insecurity (04/03/2024)  Housing: Low Risk (04/03/2024)  Transportation Needs: No Transportation Needs (04/03/2024)  Utilities: Not At Risk (07/30/2023)  Depression (PHQ2-9): Low Risk (04/04/2024)  Financial Resource Strain: Low Risk (04/03/2024)  Physical Activity: Inactive (04/03/2024)  Social Connections: Socially Isolated (04/03/2024)  Stress: No Stress Concern Present (04/03/2024)  Tobacco Use: Low Risk (04/04/2024)   See flowsheets for full screening details  Depression Screen PHQ 2 & 9 Depression Scale- Over the past 2 weeks, how often have you been bothered by any of the following problems? Little interest or pleasure in doing things: 0 Feeling down, depressed, or hopeless (PHQ Adolescent also includes...irritable): 0 PHQ-2 Total Score: 0     Goals Addressed   None          Objective:    There were no vitals filed for this visit. There is no height or weight on file to calculate BMI.  Hearing/Vision screen Hearing Screening - Comments:: No hearing issues Vision Screening - Comments:: Last eye exam less than 12 months ago, Dr.Tanner  Immunizations and Health Maintenance Health Maintenance  Topic Date Due   Zoster Vaccines- Shingrix (1 of 2) 05/03/2024 (  Originally 10/05/1944)   COVID-19 Vaccine (7 - Pfizer risk 2025-26 season) 06/29/2024   Medicare Annual Wellness (AWV)  04/04/2025   DTaP/Tdap/Td (3 - Td or Tdap) 01/28/2034   Pneumococcal Vaccine: 50+ Years  Completed   Influenza Vaccine  Completed   Bone Density Scan  Completed   Meningococcal B Vaccine  Aged Out   Mammogram  Discontinued        Assessment/Plan:  This is a routine wellness examination for Krystal [name].  Patient Care Team: Caro Harlene POUR, NP as PCP - General (Geriatric Medicine) Timmy Maude SAUNDERS, MD as Consulting Physician (Oncology) Patrcia Sharper, MD as  Consulting Physician (Ophthalmology)  I have personally reviewed and noted the following in the patients chart:   Medical and social history Use of alcohol , tobacco or illicit drugs  Current medications and supplements including opioid prescriptions. Functional ability and status Nutritional status Physical activity Advanced directives List of other physicians Hospitalizations, surgeries, and ER visits in previous 12 months Vitals Screenings to include cognitive, depression, and falls Referrals and appointments  No orders of the defined types were placed in this encounter.  In addition, I have reviewed and discussed with patient certain preventive protocols, quality metrics, and best practice recommendations. A written personalized care plan for preventive services as well as general preventive health recommendations were provided to patient.   Harlene POUR Caro, NP   04/04/2024   Return in 1 year (on 04/04/2025). For AWV  After Visit Summary: (MyChart) Due to this being a telephonic visit, the after visit summary with patients personalized plan was offered to patient via MyChart    "

## 2024-04-04 NOTE — Patient Instructions (Signed)
 Krystal [name],  Thank you for taking the time for your Medicare Wellness Visit. I appreciate your continued commitment to your health goals. Please review the care plan we discussed, and feel free to reach out if I can assist you further.  Please note that Annual Wellness Visits do not include a physical exam. Some assessments may be limited, especially if the visit was conducted virtually. If needed, we may recommend an in-person follow-up with your provider.  Ongoing Care Seeing your primary care provider every 3 to 6 months helps us  monitor your health and provide consistent, personalized care.   Referrals If a referral was made during today's visit and you haven't received any updates within two weeks, please contact the referred provider directly to check on the status.  Recommended Screenings:  Health Maintenance  Topic Date Due   Medicare Annual Wellness Visit  03/29/2024   Zoster (Shingles) Vaccine (1 of 2) 05/03/2024*   COVID-19 Vaccine (7 - Pfizer risk 2025-26 season) 06/29/2024   DTaP/Tdap/Td vaccine (3 - Td or Tdap) 01/28/2034   Pneumococcal Vaccine for age over 76  Completed   Flu Shot  Completed   Osteoporosis screening with Bone Density Scan  Completed   Meningitis B Vaccine  Aged Out   Breast Cancer Screening  Discontinued  *Topic was postponed. The date shown is not the original due date.       04/04/2024   11:01 AM  Advanced Directives  Does Patient Have a Medical Advance Directive? Yes  Type of Estate Agent of [place];Living will;Out of facility DNR (pink MOST or yellow form)  Does patient want to make changes to medical advance directive? No - Patient declined  Copy of Healthcare Power of Attorney in Chart? Yes - validated most recent copy scanned in chart (See row information)  Pre-existing out of facility DNR order (yellow form or pink MOST form) Yellow form placed in chart (order not valid for inpatient use)    Vision: Annual vision  screenings are recommended for early detection of glaucoma, cataracts, and diabetic retinopathy. These exams can also reveal signs of chronic conditions such as diabetes and high blood pressure.  Dental: Annual dental screenings help detect early signs of oral cancer, gum disease, and other conditions linked to overall health, including heart disease and diabetes.  Please see the attached documents for additional preventive care recommendations.

## 2024-07-25 ENCOUNTER — Ambulatory Visit: Admitting: Nurse Practitioner

## 2025-04-10 ENCOUNTER — Ambulatory Visit: Admitting: Nurse Practitioner
